# Patient Record
Sex: Female | Born: 1960
Health system: Southern US, Community
[De-identification: ages and names within clinical notes are randomized; demographics above are authoritative.]

## PROBLEM LIST (undated history)

## (undated) DIAGNOSIS — G7000 Myasthenia gravis without (acute) exacerbation: Secondary | ICD-10-CM

## (undated) DIAGNOSIS — E8729 Other acidosis: Secondary | ICD-10-CM

## (undated) DIAGNOSIS — H409 Unspecified glaucoma: Secondary | ICD-10-CM

## (undated) DIAGNOSIS — D219 Benign neoplasm of connective and other soft tissue, unspecified: Secondary | ICD-10-CM

## (undated) DIAGNOSIS — R011 Cardiac murmur, unspecified: Secondary | ICD-10-CM

## (undated) DIAGNOSIS — E46 Unspecified protein-calorie malnutrition: Secondary | ICD-10-CM

## (undated) DIAGNOSIS — N189 Chronic kidney disease, unspecified: Secondary | ICD-10-CM

## (undated) DIAGNOSIS — I82409 Acute embolism and thrombosis of unspecified deep veins of unspecified lower extremity: Secondary | ICD-10-CM

## (undated) DIAGNOSIS — E872 Acidosis: Secondary | ICD-10-CM

## (undated) DIAGNOSIS — I509 Heart failure, unspecified: Secondary | ICD-10-CM

## (undated) DIAGNOSIS — A419 Sepsis, unspecified organism: Secondary | ICD-10-CM

## (undated) DIAGNOSIS — M069 Rheumatoid arthritis, unspecified: Secondary | ICD-10-CM

## (undated) DIAGNOSIS — E876 Hypokalemia: Secondary | ICD-10-CM

## (undated) DIAGNOSIS — I10 Essential (primary) hypertension: Secondary | ICD-10-CM

## (undated) DIAGNOSIS — D509 Iron deficiency anemia, unspecified: Secondary | ICD-10-CM

## (undated) DIAGNOSIS — I878 Other specified disorders of veins: Secondary | ICD-10-CM

## (undated) DIAGNOSIS — J45909 Unspecified asthma, uncomplicated: Secondary | ICD-10-CM

## (undated) HISTORY — DX: Hypokalemia: E87.6

## (undated) HISTORY — PX: OTHER SURGICAL HISTORY: SHX169

## (undated) HISTORY — DX: Unspecified glaucoma: H40.9

## (undated) HISTORY — DX: Other acidosis: E87.29

## (undated) HISTORY — DX: Acidosis: E87.2

## (undated) HISTORY — PX: BRONCHOSCOPY: SUR163

## (undated) HISTORY — DX: Sepsis, unspecified organism: A41.9

## (undated) HISTORY — DX: Unspecified protein-calorie malnutrition: E46

## (undated) HISTORY — DX: Rheumatoid arthritis, unspecified: M06.9

## (undated) HISTORY — DX: Iron deficiency anemia, unspecified: D50.9

## (undated) HISTORY — DX: Chronic kidney disease, unspecified: N18.9

---
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
## 1998-11-26 HISTORY — PX: MYOMECTOMY: SHX85

## 2006-11-16 ENCOUNTER — Emergency Department (HOSPITAL_COMMUNITY): Admission: EM | Admit: 2006-11-16 | Discharge: 2006-11-16 | Payer: Self-pay | Admitting: Emergency Medicine

## 2007-03-31 ENCOUNTER — Emergency Department (HOSPITAL_COMMUNITY): Admission: EM | Admit: 2007-03-31 | Discharge: 2007-03-31 | Payer: Self-pay | Admitting: Family Medicine

## 2007-07-05 ENCOUNTER — Emergency Department (HOSPITAL_COMMUNITY): Admission: EM | Admit: 2007-07-05 | Discharge: 2007-07-05 | Payer: Self-pay | Admitting: Family Medicine

## 2007-10-20 ENCOUNTER — Emergency Department (HOSPITAL_COMMUNITY): Admission: EM | Admit: 2007-10-20 | Discharge: 2007-10-20 | Payer: Self-pay | Admitting: Emergency Medicine

## 2007-10-25 ENCOUNTER — Emergency Department (HOSPITAL_COMMUNITY): Admission: EM | Admit: 2007-10-25 | Discharge: 2007-10-25 | Payer: Self-pay | Admitting: Emergency Medicine

## 2007-11-22 ENCOUNTER — Emergency Department (HOSPITAL_COMMUNITY): Admission: EM | Admit: 2007-11-22 | Discharge: 2007-11-22 | Payer: Self-pay | Admitting: Family Medicine

## 2007-11-24 ENCOUNTER — Emergency Department (HOSPITAL_COMMUNITY): Admission: EM | Admit: 2007-11-24 | Discharge: 2007-11-24 | Payer: Self-pay | Admitting: Emergency Medicine

## 2008-06-13 ENCOUNTER — Emergency Department (HOSPITAL_COMMUNITY): Admission: EM | Admit: 2008-06-13 | Discharge: 2008-06-13 | Payer: Self-pay | Admitting: Emergency Medicine

## 2008-08-03 ENCOUNTER — Inpatient Hospital Stay (HOSPITAL_COMMUNITY): Admission: EM | Admit: 2008-08-03 | Discharge: 2008-08-16 | Payer: Self-pay | Admitting: Emergency Medicine

## 2008-08-04 ENCOUNTER — Encounter (INDEPENDENT_AMBULATORY_CARE_PROVIDER_SITE_OTHER): Payer: Self-pay | Admitting: *Deleted

## 2010-02-15 ENCOUNTER — Encounter: Payer: Self-pay | Admitting: *Deleted

## 2010-02-16 ENCOUNTER — Encounter: Payer: Self-pay | Admitting: Family Medicine

## 2010-02-16 ENCOUNTER — Encounter: Payer: Self-pay | Admitting: *Deleted

## 2010-05-03 LAB — CBC
HCT: 21.3 % — ABNORMAL LOW (ref 36.0–46.0)
HCT: 21.8 % — ABNORMAL LOW (ref 36.0–46.0)
HCT: 21.9 % — ABNORMAL LOW (ref 36.0–46.0)
HCT: 22.4 % — ABNORMAL LOW (ref 36.0–46.0)
HCT: 24.9 % — ABNORMAL LOW (ref 36.0–46.0)
HCT: 29.5 % — ABNORMAL LOW (ref 36.0–46.0)
Hemoglobin: 6.5 g/dL — CL (ref 12.0–15.0)
Hemoglobin: 6.9 g/dL — CL (ref 12.0–15.0)
Hemoglobin: 7.2 g/dL — CL (ref 12.0–15.0)
Hemoglobin: 7.5 g/dL — CL (ref 12.0–15.0)
Hemoglobin: 7.4 g/dL — CL (ref 12.0–15.0)
Hemoglobin: 8 g/dL — ABNORMAL LOW (ref 12.0–15.0)
Hemoglobin: 9 g/dL — ABNORMAL LOW (ref 12.0–15.0)
MCHC: 31.7 g/dL (ref 30.0–36.0)
MCHC: 31.7 g/dL (ref 30.0–36.0)
MCHC: 31 g/dL (ref 30.0–36.0)
MCHC: 31.5 g/dL (ref 30.0–36.0)
MCHC: 31.5 g/dL (ref 30.0–36.0)
MCHC: 31.6 g/dL (ref 30.0–36.0)
MCV: 64.6 fL — ABNORMAL LOW (ref 78.0–100.0)
MCV: 65.4 fL — ABNORMAL LOW (ref 78.0–100.0)
MCV: 65.6 fL — ABNORMAL LOW (ref 78.0–100.0)
MCV: 64.7 fL — ABNORMAL LOW (ref 78.0–100.0)
Platelets: 358 10*3/uL (ref 150–400)
Platelets: 360 10*3/uL (ref 150–400)
Platelets: 372 10*3/uL (ref 150–400)
Platelets: 427 10*3/uL — ABNORMAL HIGH (ref 150–400)
Platelets: 544 10*3/uL — ABNORMAL HIGH (ref 150–400)
RBC: 3.11 MIL/uL — ABNORMAL LOW (ref 3.87–5.11)
RBC: 3.32 MIL/uL — ABNORMAL LOW (ref 3.87–5.11)
RBC: 3.36 MIL/uL — ABNORMAL LOW (ref 3.87–5.11)
RBC: 3.67 MIL/uL — ABNORMAL LOW (ref 3.87–5.11)
RBC: 3.62 MIL/uL — ABNORMAL LOW (ref 3.87–5.11)
RBC: 3.87 MIL/uL (ref 3.87–5.11)
RBC: 3.93 MIL/uL (ref 3.87–5.11)
RDW: 27.5 % — ABNORMAL HIGH (ref 11.5–15.5)
RDW: 28 % — ABNORMAL HIGH (ref 11.5–15.5)
RDW: 28.2 % — ABNORMAL HIGH (ref 11.5–15.5)
RDW: 28.4 % — ABNORMAL HIGH (ref 11.5–15.5)
RDW: 28.7 % — ABNORMAL HIGH (ref 11.5–15.5)
RDW: 29.1 % — ABNORMAL HIGH (ref 11.5–15.5)
RDW: 29.2 % — ABNORMAL HIGH (ref 11.5–15.5)
RDW: 25.8 % — ABNORMAL HIGH (ref 11.5–15.5)
RDW: 26.8 % — ABNORMAL HIGH (ref 11.5–15.5)
WBC: 10.3 10*3/uL (ref 4.0–10.5)
WBC: 8.8 10*3/uL (ref 4.0–10.5)
WBC: 11.8 10*3/uL — ABNORMAL HIGH (ref 4.0–10.5)
WBC: 14.4 10*3/uL — ABNORMAL HIGH (ref 4.0–10.5)

## 2010-05-03 LAB — BASIC METABOLIC PANEL
BUN: 1 mg/dL — ABNORMAL LOW (ref 6–23)
BUN: 24 mg/dL — ABNORMAL HIGH (ref 6–23)
CO2: 25 mEq/L (ref 19–32)
CO2: 26 mEq/L (ref 19–32)
CO2: 26 mEq/L (ref 19–32)
Calcium: 7.7 mg/dL — ABNORMAL LOW (ref 8.4–10.5)
Calcium: 7.6 mg/dL — ABNORMAL LOW (ref 8.4–10.5)
Calcium: 7.7 mg/dL — ABNORMAL LOW (ref 8.4–10.5)
Calcium: 7.9 mg/dL — ABNORMAL LOW (ref 8.4–10.5)
Chloride: 112 mEq/L (ref 96–112)
Creatinine, Ser: 0.76 mg/dL (ref 0.4–1.2)
Creatinine, Ser: 1.02 mg/dL (ref 0.4–1.2)
GFR calc Af Amer: >60 mL/min (ref >60)
GFR calc Af Amer: >60 mL/min (ref >60)
GFR calc Af Amer: >60 mL/min (ref >60)
GFR calc Af Amer: >60 mL/min (ref >60)
GFR calc non Af Amer: >60 mL/min (ref >60)
GFR calc non Af Amer: 58 mL/min — ABNORMAL LOW (ref >60)
GFR calc non Af Amer: >60 mL/min (ref >60)
GFR calc non Af Amer: >60 mL/min (ref >60)
Glucose, Bld: 88 mg/dL (ref 70–99)
Glucose, Bld: 96 mg/dL (ref 70–99)
Glucose, Bld: 79 mg/dL (ref 70–99)
Potassium: 3.7 mEq/L (ref 3.5–5.1)
Potassium: 3.1 mEq/L — ABNORMAL LOW (ref 3.5–5.1)
Potassium: 3.3 mEq/L — ABNORMAL LOW (ref 3.5–5.1)
Sodium: 136 mEq/L (ref 135–145)
Sodium: 138 mEq/L (ref 135–145)
Sodium: 141 mEq/L (ref 135–145)

## 2010-05-03 LAB — COMPREHENSIVE METABOLIC PANEL
ALT: 8 U/L (ref 0–35)
ALT: 8 U/L (ref 0–35)
ALT: 10 U/L (ref 0–35)
ALT: 10 U/L (ref 0–35)
AST: 12 U/L (ref 0–37)
AST: 12 U/L (ref 0–37)
AST: 17 U/L (ref 0–37)
AST: 19 U/L (ref 0–37)
Albumin: 1.6 g/dL — ABNORMAL LOW (ref 3.5–5.2)
Albumin: 1.9 g/dL — ABNORMAL LOW (ref 3.5–5.2)
Albumin: 2.6 g/dL — ABNORMAL LOW (ref 3.5–5.2)
Alkaline Phosphatase: 58 U/L (ref 39–117)
Alkaline Phosphatase: 101 U/L (ref 39–117)
Alkaline Phosphatase: 109 U/L (ref 39–117)
BUN: 3 mg/dL — ABNORMAL LOW (ref 6–23)
BUN: 3 mg/dL — ABNORMAL LOW (ref 6–23)
BUN: 34 mg/dL — ABNORMAL HIGH (ref 6–23)
CO2: 26 mEq/L (ref 19–32)
Calcium: 7.6 mg/dL — ABNORMAL LOW (ref 8.4–10.5)
Calcium: 7.9 mg/dL — ABNORMAL LOW (ref 8.4–10.5)
Calcium: 7.5 mg/dL — ABNORMAL LOW (ref 8.4–10.5)
Calcium: 8 mg/dL — ABNORMAL LOW (ref 8.4–10.5)
Chloride: 101 mEq/L (ref 96–112)
Creatinine, Ser: 0.64 mg/dL (ref 0.4–1.2)
Creatinine, Ser: 0.68 mg/dL (ref 0.4–1.2)
Creatinine, Ser: 1.48 mg/dL — ABNORMAL HIGH (ref 0.4–1.2)
GFR calc Af Amer: >60 mL/min (ref >60)
GFR calc Af Amer: >60 mL/min (ref >60)
GFR calc Af Amer: 58 mL/min — ABNORMAL LOW (ref >60)
GFR calc Af Amer: >60 mL/min (ref >60)
GFR calc non Af Amer: >60 mL/min (ref >60)
GFR calc non Af Amer: >60 mL/min (ref >60)
GFR calc non Af Amer: 48 mL/min — ABNORMAL LOW (ref >60)
Glucose, Bld: 90 mg/dL (ref 70–99)
Glucose, Bld: 101 mg/dL — ABNORMAL HIGH (ref 70–99)
Glucose, Bld: 103 mg/dL — ABNORMAL HIGH (ref 70–99)
Glucose, Bld: 78 mg/dL (ref 70–99)
Potassium: 3.4 mEq/L — ABNORMAL LOW (ref 3.5–5.1)
Potassium: 2.8 mEq/L — ABNORMAL LOW (ref 3.5–5.1)
Potassium: 2.8 mEq/L — ABNORMAL LOW (ref 3.5–5.1)
Sodium: 139 mEq/L (ref 135–145)
Sodium: 141 mEq/L (ref 135–145)
Total Bilirubin: 0.5 mg/dL (ref 0.3–1.2)
Total Bilirubin: 0.6 mg/dL (ref 0.3–1.2)
Total Bilirubin: 0.6 mg/dL (ref 0.3–1.2)
Total Protein: 5.7 g/dL — ABNORMAL LOW (ref 6.0–8.3)
Total Protein: 5.9 g/dL — ABNORMAL LOW (ref 6.0–8.3)
Total Protein: 7.2 g/dL (ref 6.0–8.3)

## 2010-05-03 LAB — WOUND CULTURE

## 2010-05-03 LAB — DIFFERENTIAL
Basophils Absolute: 0 10*3/uL (ref 0.0–0.1)
Basophils Relative: 0 % (ref 0–1)
Basophils Relative: 0 % (ref 0–1)
Eosinophils Absolute: 0.4 10*3/uL (ref 0.0–0.7)
Eosinophils Absolute: 0 10*3/uL (ref 0.0–0.7)
Eosinophils Relative: 0 % (ref 0–5)
Eosinophils Relative: 0 % (ref 0–5)
Lymphocytes Relative: 10 % — ABNORMAL LOW (ref 12–46)
Lymphocytes Relative: 5 % — ABNORMAL LOW (ref 12–46)
Lymphs Abs: 0.8 10*3/uL (ref 0.7–4.0)
Monocytes Absolute: 1.8 10*3/uL — ABNORMAL HIGH (ref 0.1–1.0)
Monocytes Relative: 9 % (ref 3–12)
Neutrophils Relative %: 78 % — ABNORMAL HIGH (ref 43–77)
Neutrophils Relative %: 90 % — ABNORMAL HIGH (ref 43–77)

## 2010-05-03 LAB — IRON AND TIBC
Saturation Ratios: 14 % — ABNORMAL LOW (ref 20–55)
Saturation Ratios: 16 % — ABNORMAL LOW (ref 20–55)
TIBC: 134 ug/dL — ABNORMAL LOW (ref 250–470)
UIBC: 112 ug/dL

## 2010-05-03 LAB — URINALYSIS, MICROSCOPIC ONLY
Bilirubin Urine: NEGATIVE
Hgb urine dipstick: NEGATIVE
Hgb urine dipstick: NEGATIVE
Nitrite: NEGATIVE
Protein, ur: 30 mg/dL — AB
Specific Gravity, Urine: 1.013 (ref 1.005–1.030)
Urobilinogen, UA: 0.2 mg/dL (ref 0.0–1.0)
Urobilinogen, UA: 0.2 mg/dL (ref 0.0–1.0)

## 2010-05-03 LAB — CULTURE, BLOOD (ROUTINE X 2)
Culture: NO GROWTH
Culture: NO GROWTH

## 2010-05-03 LAB — URINALYSIS, ROUTINE W REFLEX MICROSCOPIC
Bilirubin Urine: NEGATIVE
Glucose, UA: NEGATIVE mg/dL
Hgb urine dipstick: NEGATIVE
Ketones, ur: NEGATIVE mg/dL
Protein, ur: NEGATIVE mg/dL

## 2010-05-03 LAB — CROSSMATCH: Antibody Screen: NEGATIVE

## 2010-05-03 LAB — LIPASE, BLOOD
Lipase: 287 U/L — ABNORMAL HIGH (ref 11–59)
Lipase: 338 U/L — ABNORMAL HIGH (ref 11–59)
Lipase: 684 U/L — ABNORMAL HIGH (ref 11–59)

## 2010-05-03 LAB — LIPID PANEL
Cholesterol: 75 mg/dL (ref 0–200)
HDL: 38 mg/dL — ABNORMAL LOW (ref >39)

## 2010-05-03 LAB — MAGNESIUM: Magnesium: 2.7 mg/dL — ABNORMAL HIGH (ref 1.5–2.5)

## 2010-05-03 LAB — BRAIN NATRIURETIC PEPTIDE: Pro B Natriuretic peptide (BNP): 135 pg/mL — ABNORMAL HIGH (ref 0.0–100.0)

## 2010-05-03 LAB — CREATININE, URINE, RANDOM: Creatinine, Urine: 42.4 mg/dL

## 2010-05-03 LAB — ABO/RH: ABO/RH(D): AB POS

## 2010-05-03 LAB — SODIUM, URINE, RANDOM: Sodium, Ur: 79 mEq/L

## 2010-05-03 LAB — FOLATE RBC: RBC Folate: 1820 ng/mL — ABNORMAL HIGH (ref 180–600)

## 2010-05-03 LAB — AMYLASE: Amylase: 548 U/L — ABNORMAL HIGH (ref 27–131)

## 2010-05-03 LAB — LACTATE DEHYDROGENASE: LDH: 195 U/L (ref 94–250)

## 2010-05-03 LAB — VITAMIN B12: Vitamin B-12: 1264 pg/mL — ABNORMAL HIGH (ref 211–911)

## 2010-05-03 LAB — FERRITIN: Ferritin: 89 ng/mL (ref 10–291)

## 2010-06-09 NOTE — Discharge Summary (Signed)
[name], [name]            ACCOUNT NO.:  [id number]   MEDICAL RECORD NO.:  [id number]          PATIENT TYPE:  INP   LOCATION:  1537                         FACILITY:  [facility]   PHYSICIAN:  Hillery Aldo, M.D.   DATE OF BIRTH:  [birth year]   DATE OF ADMISSION:  08/03/2008  DATE OF DISCHARGE:                               DISCHARGE SUMMARY   PRIMARY CARE PHYSICIAN:  Harriette Ohara at [facility]   DISCHARGE DIAGNOSES:  1. Acute pancreatitis thought to be doxycycline induced.  2. Venous stasis ulcers with polymicrobial skin infection.  3. Rheumatoid arthritis.  4. Myasthenia gravis.  5. Normocytic anemia.  6. Fibroids.  7. Menorrhagia secondary to fibroids.  8. Hypokalemia.  9. Hypertension.  10.History of deep venous thrombosis in the past.  11.Asthma.  12.Obesity.  13.Multifactorial anemia, likely due to anemia of chronic disease as      well as menorrhagia.  14.Osteoarthritis.  15.Thalassemia major.  16.Acute renal failure, resolved.   DISCHARGE MEDICATIONS:  1. Pyridostigmine 60 mg p.o. q.6 h.  2. Lasix 10 mg p.o. daily.  3. Oxycodone 10 mg p.o. q.6 h. p.r.n. pain.  4. Colace 100 mg p.o. daily p.r.n. constipation.  5. Famotidine 20 mg p.o. daily p.r.n. dyspepsia.  6. Loperamide 2 mg p.o. daily p.r.n. diarrhea.  7. Diphenhydramine 25 mg p.o. q.4 h. p.r.n. allergy symptoms.  8. Ibuprofen 400 mg p.o. q.6 h. p.r.n. pain.  9. Coreg 6.25 mg p.o. b.i.d.  10.Ferrous sulfate 325 mg p.o. t.i.d.   CONSULTATIONS:  Wound care RN.   BRIEF ADMISSION HISTORY OF PRESENT ILLNESS:  The patient is a 50-year-  old female who presented to the hospital with abdominal pain accompanied  by nausea and vomiting.  She had recently been put on doxycycline for  treatment of infected venous stasis ulcers.  Upon initial evaluation in  the emergency department, she was found to have an elevated lipase of  684, subsequently was referred to the hospitalist service for  treatment  of acute pancreatitis.  For the full details, please see the dictated  report done by Dr. Selena Batten.   PROCEDURES AND DIAGNOSTIC STUDIES:  1. CT scan of the abdomen and pelvis on August 03, 2008, showed      expansion of the pancreas with inflammatory changes.  These      findings consistent with pancreatitis.  Necrosis could not be      determined.  Underlying pancreatic neoplasm could not be excluded.      Followup to ensure resolution of these findings is warranted.      Lobulated uterus in the pelvis most consistent with multiple      fibroids.  2. Chest x-ray on August 05, 2008, status post placement of a      peripherally inserted central catheter showed the catheter tip at      the junction of the superior vena cava and right atrium.  Poor      inspiration with mild right basilar atelectasis and minimal left      basilar atelectasis.  Interval pulmonary vascular congestion      accentuated by the  poor inspiration.  3. Pelvic ultrasound on August 09, 2008, showed a lobular heterogeneous      uterus with imaging features most suggestive of multiple fibroids.   DISCHARGE LABORATORY VALUES:  Blood cultures were negative x2.  Sodium  was 133, potassium 3.6, chloride 105, bicarb 23, BUN 3, creatinine 0.64,  glucose 87.  Liver function studies were within normal limits.  Total  protein was 5.4, albumin 1.6, calcium 7.6, white blood cell count 10.3,  hemoglobin 6.9, hematocrit 21.9, platelets 341.   HOSPITAL COURSE BY PROBLEM:  1. Acute pancreatitis:  The patient's pancreatitis was felt to be      doxycycline induced.  Doxycycline was discontinued, and the patient      was put on bowel rest.  Her diet was gradually advanced, and she      initially did well with this but then had a recurrence of abdominal      pain requiring downgrading of her diet back to clear liquids.      Patient now is tolerating solid foods without recurrence of pain.      If she continues to have no reports of  pain with solid foods, she      can be discharged tomorrow, August 14, 2008.  2. Venous stasis ulcers/polymicrobial infection:  Patient does have      venous stasis ulcers mainly involving the left lower extremity.      She was seen and evaluated by the wound care nurse.  Patient has a      right posterior thigh stage II wound.  Cultures of this wound were      polymicrobial with no Staph aureus or group A Strep isolated.      Patient has completed 10 days of therapy with Zosyn, and antibiotic      coverage is going to be discontinued.  We will set her up with      ongoing home health nursing for wound care.  3. Rheumatoid arthritis:  Patient's rheumatoid arthritis is stable.  4. Myasthenia gravis:  The patient continues on treatment with      Mestinon.  5. Normocytic anemia secondary to chronic disease and menorrhagia in      the setting of fibroids:  Patient's hemoglobin has been low.      However, she refuses packed red blood cells.  She is asymptomatic      and denying any dizziness, lightheadedness, palpitations, or      dyspnea.  Her iron was low at 22 with a low total iron binding      capacity indicating both iron deficiency and anemia of chronic      disease.  Patient was encouraged to follow up with her OB/GYN as an      outpatient for definitive treatment of her fibroids.  She was also      put on iron supplementation therapy.  6. Hypokalemia:  Patient's potassium was repleted.  7. Hypertension:  Patient's blood pressure is well controlled on      Coreg.  Lasix was initially held secondary to her pancreatitis.  It      has now been resumed.  8. Acute renal failure:  The patient did have acute renal failure on      admission with an elevated creatinine at 1.48.  With hydration and      holding the Lasix, her creatinine has come down to 0.64.  At this      time, since she is complaining of swelling of  the lower      extremities, we will resume Lasix.   DISPOSITION:  The patient  is approaching medical stability and plan is  to discharge her home with home health nursing services if she continues  to tolerate a solid food diet without recurrent abdominal pain.  If her  abdominal  pain returns, she should have a repeat CT scan done to rule out  pancreatic necrosis.  Patient is instructed to follow up with her  primary care physician in 1-2 weeks.  She is instructed to follow up  with an OB/GYN for treatment of her fibroids.      Hillery Aldo, M.D.  Electronically Signed     CR/MEDQ  D:  08/13/2008  T:  08/13/2008  Job:  [id number]   cc:   Harrel Carina

## 2010-06-09 NOTE — H&P (Signed)
[name], [name]            ACCOUNT NO.:  [id number]   MEDICAL RECORD NO.:  [id number]          PATIENT TYPE:  WOC   LOCATION:  WOC                          FACILITY:  [facility]   PHYSICIAN:  [name] Maroon, MD        DATE OF BIRTH:  09-25-1960   DATE OF ADMISSION:  DATE OF DISCHARGE:                              HISTORY & PHYSICAL   [name], physician at [facility], phone number  is [phone]   CHIEF COMPLAINT:  Nausea and vomiting.   HISTORY OF PRESENT ILLNESS:  A 50 year old female with a history of  myasthenia gravis, rheumatoid arthritis, thalassemia, left calf ulcer  complains of inability to eat starting on Wednesday.  She then  apparently had nausea and vomiting yesterday which was just food and  liquid.  There was no blood or coffee grounds.  The patient then  apparently started having sharp abdominal pain that was constant without  radiation and located in the epigastric area.  She notes that she has  run out of her oxycodone and she has been taking ibuprofen.  She also  notes that she  started to take doxycycline about 1-2 weeks ago for her  leg ulcer.  The patient denies any diarrhea, constipation, bright red  blood per rectum, black stool, dysuria, hematuria, cough, chest pain or  shortness of breath.  The patient came to be evaluated for her nausea  and vomiting and was found to have a lipase elevation of 684.  She was  also noted to be slightly dehydrated with a BUN and creatinine of 34 and  1.48 and hypokalemic with a potassium of 2.3.  Her albumin was also low  with 2.6 signifying poor nutritional state and she was noted to be  anemic with a hemoglobin of 9.2, with a low MCV of 64 ,and a RDW of  25.8.  The patient will be admitted for workup of pancreatitis.   PAST MEDICAL HISTORY:  1. Hypertension.  2. Asthma.  3. History of DVT in bilateral lower extremities.  4. Myasthenia gravis.  5. ? Muscular dystrophy  6. Obesity.  7.  Anemia.  8. Cardiac murmur.  9. Osteoarthritis.  10.Rheumatoid arthritis.  11.Right knee cap displacement  12.Osteoporosis.  13.Thalassemia major  14.Tinea pedis  15.Venous stasis changes.  16.Post phlebitic syndrome.  17.Left calf ulcer.   PAST SURGICAL HISTORY:  1. Tonsillectomy  2. Myomectomy.  3. Bronchoscopy with biopsy in the past, results unknown  4. Apligraf to left leg that did not take.   SOCIAL HISTORY:  The patient does not smoke or drink.  She was formerly  a Warden/ranger.  She is presently on disability.   FAMILY HISTORY:  Mother died of at age 47 and died from atrial  fibrillation per the patient after being diagnosed with breast cancer  stage IV and having some chemo which caused some form of cardiomyopathy.  Her father died at age 59 from a stroke.  She also notes that she has a  family history of hypertension and heart disease.   REVIEW OF SYSTEMS:  She states that she  may have been diagnosed recently  with multiple myeloma.  She is not sure.  Otherwise, no fever, no  chills, negative for all 10 organ systems except for pertinent positives  as stated above.   ALLERGIES:  TYLENOL, ASPIRIN, BACTRIM, DAKIN'S, DILAUDID, ERYTHROMYCIN  BASE, INDOMETHACIN, METHOTREXATE, NEOSPORIN, PERCOCET (due to the  Tylenol component), PREDNISONE, SILVER, TRAMADOL, VICODIN (due to the  Tylenol component).   MEDICATIONS:  Per list from ED.  1. Pyridostigmine 60 mg p.o. q.6 h.  2. Lasix 10 mg p.o. daily.  3. Oxycodone 10 mg p.o. q.6 h.  4. Doxycycline 100 mg p.o. b.i.d.  5. Stool softener p.r.n.  6. Famotidine 20 mg p.o. daily p.r.n.  7. loperamide 2 mg p.o. daily p.r.n.  8. Diphenhydramine 25 mg p.r.n.  9. Ibuprofen p.r.n. (has been recently taking).   PHYSICAL EXAM:  VITAL SIGNS:  Temperature 97.8, pulse 78, respiratory  rate 22, blood pressure 136/67.  HEENT: Anicteric, EOMI, no nystagmus, pupils 1.5 mm, symmetric, direct,  consensual, near reflexes intact.  Mucous  membranes moist.  NECK:  No JVD, no bruit, no thyromegaly, no adenopathy.  HEART:  Regular rate and rhythm.  S1-S2.  LUNGS:  Clear to auscultation bilaterally.  ABDOMEN: Soft, slightly tender, no guarding, no rigidity, no rebound,  positive normoactive bowel sounds.  EXTREMITIES:  No cyanosis, clubbing or edema, DP pulses 2+ bilaterally.  SKIN:  Evidence for a left distal lower extremity ulcer about 10 x 8 cm,  shallow, 0.5 mm deep, circumferential around the left distal lower  extremity, smaller ulcer on right distal lower extremity which was 1 cm  round, mobile, shallow, no purulent drainage, no erythema, no warmth,  apparently there is an ulcer on her thigh, but she would not let me look  at this ulcer.  NEURO EXAM:  Cranial nerves II-XII intact, reflexes 2+, symmetric,  diffuse with downgoing toes bilaterally, motor strength 5/5 all four  extremities, pinprick intact.   LABS:  WBC 13.1, hemoglobin 9.2, MCV 64, RDW 25.8, platelet count 544.  Sodium 138, potassium 2.3 (low), chloride 95, bicarb 28, BUN 34,  creatinine 1.48 (high), AST 19, ALT 10, albumin 2.6 (low), total  bilirubin 0.6, urinalysis negative, urine pregnancy test negative,  lipase 684.   ASSESSMENT/PLAN:  #1.  Nausea, vomiting, pancreatitis:  We will check an  LDH and a lipid panel to complete Rampton's criteria.  We will  discontinue doxycycline.  We will start her on Protonix 40 mg IV daily.  We will discontinue oxycodone in favor morphine sulfate 2 mg IV q.6 h.  standing as well as 2 mg IV q.2 h. p.r.n. additional pain.  The patient  was made n.p.o.  We will hydrate with normal saline IV.  We will obtain  a CT scan of the abdomen and pelvis which is pending.  #2.  Acute renal failure possibly secondary to dehydration or ibuprofen:  We will check urinalysis, check urine sodium, urine creatinine, urine  eosinophils.  #3.  Hypokalemia:  We will place 28 mEq of potassium chloride in 1 liter  of normal saline and  hydrate with that as well as giving 10 mEq of  potassium chloride and 100 of normal saline IV x 4 rounds.  We will  repeat a potassium at 7 p.m.  Her hypokalemia is probably secondary to  her nausea and vomiting.  #4.  Anemia:  We will check iron studies, B12, folic acid and she could  have further workup as an outpatient for an SPEP and  UPEP and possibly  colonoscopy if she has not had this in the past.  #5.  Lower extremity ulcers:  We will Consult Wound Care.  We will  obtain a wound culture and the patient will be placed on an air  mattress.  We will treat with IV Zosyn for now.  #6.  Rheumatoid arthritis:  The patient can follow-up with Dr. Corliss Skains  as an outpatient.  She states she has an appointment with her.  #7.  Deep venous thrombosis prophylaxis:  Lovenox 30 mg subcu daily.  She says she has renal insufficiency.      [name] Maroon, MD  Electronically Signed     JYK/MEDQ  D:  08/03/2008  T:  08/03/2008  Job:  [id number]   cc:   Maryelizabeth Rowan, M.D.  Fax: [phone]   Vanessa Kick, MD [facility]   [facility]  Wound Care Center at [facility]

## 2010-06-12 NOTE — Discharge Summary (Signed)
[name], [name]            ACCOUNT NO.:  [id number]   MEDICAL RECORD NO.:  [id number]          PATIENT TYPE:  WOC   LOCATION:  WOC                          FACILITY:  [facility]   PHYSICIAN:  Hillery Aldo, M.D.   DATE OF BIRTH:  Nov 11, 1960   DATE OF ADMISSION:  DATE OF DISCHARGE:                               DISCHARGE SUMMARY   NO DICTATION.      Hillery Aldo, M.D.  Electronically Signed     CR/MEDQ  D:  09/04/2008  T:  09/04/2008  Job:  [id number]

## 2010-06-12 NOTE — Consult Note (Signed)
NAMEARELYS, [name]            ACCOUNT NO.:  [id number]   MEDICAL RECORD NO.:  [id number]          PATIENT TYPE:  REC   LOCATION:  FOOT                         FACILITY:  [facility]   PHYSICIAN:  Jonelle Sports. Sevier, M.D. DATE OF BIRTH:  12/05/1960   DATE OF CONSULTATION:  01/31/2008  DATE OF DISCHARGE:                                 CONSULTATION   This 50 year old white female, referred by nurse practitioner, Merita Norton,  for care of a left leg wound, arrived today for such consultation.   The patient brought with her an extensive number of notes, which will be  chronicled in her chart that she had herself generated and a number of  things that she told our nurse in response to questioning here and that  indicate an extreme degree of emotional unhealth on the patient's part  and that reflect total dissatisfaction with care that she has previously  received all up and down the [place] to include [employer],  [facility] in [place], and [employer].   In reviewing all of this, it is my assessment that we will bring no  satisfaction to this woman either to her or to ourselves in attempting  to treat her that we have nothing to offer her that she has not already  been offered and of which she is extremely critical.  It is my advice  that we enter into no contractual patient care relationship with her.  This has been discussed with the clinic administrator, who is in  agreement, and accordingly it is presented to the patient that we have  no desire to assume her care, and she is accepting that recommendation.  We today returned her to addressing that was as near as possible all  those materials with which she was addressed when she came in for her  left lower extremity wound, and I have recommended that she seek care at  a major medical center because of the complexity of her problems and her  dissatisfaction with a multiple previous cares.   The patient did not  appear angry, was accepting this recommendation, and  thanked [name] for our honesty.   Therefore, this patient is not now and should not in the future be  accepted as a patient on anything less than a life and death emergency  in this hospital system.           ______________________________  Jonelle Sports Cheryll Cockayne, M.D.     RES/MEDQ  D:  01/31/2008  T:  02/01/2008  Job:  [id number]   cc:   Carren Rang, M.D.  Tamera Punt, [place]

## 2010-06-12 NOTE — Discharge Summary (Signed)
NAMESAMMY, DOUTHITT            ACCOUNT NO.:  [id number]   MEDICAL RECORD NO.:  [id number]          PATIENT TYPE:  INP   LOCATION:  1537                         FACILITY:  [facility]   PHYSICIAN:  Hillery Aldo, M.D.   DATE OF BIRTH:  Oct 17, 1960   DATE OF ADMISSION:  08/03/2008  DATE OF DISCHARGE:  08/16/2008                               DISCHARGE SUMMARY   ADDENDUM:   PRIMARY CARE PHYSICIAN:  Dr. Harriette Ohara at [facility].   OB/GYN:  Dr. Harriette Ohara.   LOCAL PCP:  Dr. Maryelizabeth Rowan.   For complete list of the discharge diagnoses, discharge medications,  consultations, brief admission HPI, and hospital course by problem  through August 13, 2008, please see the previously dictated discharge  summary done by myself.   ADDENDUM:  The patient remained medically stable and essentially  awaiting medical stability and the ability to tolerate a solid food diet  without recurrent abdominal pain prior to discharge.  Patient continued  to show gradual improvement and ultimately was stable for discharge on  August 16, 2008.  Her pancreatitis was resolved at discharge time.  She  was encouraged to follow up with her primary care physician, Dr.  Maryelizabeth Rowan, Dr. Harriette Ohara at [facility], and also with Dr.  Harriette Ohara for followup of her fibroids.   CONDITION AT DISCHARGE:  Improved.   TIME SPENT COORDINATING CARE FOR DISCHARGE AND DISCHARGE INSTRUCTIONS:  Equals 30 minutes.      Hillery Aldo, M.D.  Electronically Signed     CR/MEDQ  D:  09/04/2008  T:  09/04/2008  Job:  [id number]   cc:   Harriette Ohara, M.D.  [facility]   Maryelizabeth Rowan, M.D.  Fax: [phone]

## 2010-07-10 ENCOUNTER — Inpatient Hospital Stay (INDEPENDENT_AMBULATORY_CARE_PROVIDER_SITE_OTHER)
Admission: RE | Admit: 2010-07-10 | Discharge: 2010-07-10 | Disposition: A | Discharge status: Home / Self Care | Payer: Medicare Other | Source: Ambulatory Visit | Attending: Family Medicine | Admitting: Family Medicine

## 2010-07-10 DIAGNOSIS — Z760 Encounter for issue of repeat prescription: Secondary | ICD-10-CM

## 2010-07-10 DIAGNOSIS — I10 Essential (primary) hypertension: Secondary | ICD-10-CM

## 2010-07-10 DIAGNOSIS — G7000 Myasthenia gravis without (acute) exacerbation: Secondary | ICD-10-CM

## 2010-07-10 DIAGNOSIS — R05 Cough: Secondary | ICD-10-CM

## 2010-07-25 ENCOUNTER — Emergency Department (HOSPITAL_COMMUNITY): Payer: Medicare Other

## 2010-07-25 ENCOUNTER — Emergency Department (HOSPITAL_COMMUNITY)
Admission: EM | Admit: 2010-07-25 | Discharge: 2010-07-25 | Disposition: A | Discharge status: Home / Self Care | Payer: Medicare Other | Attending: Emergency Medicine | Admitting: Emergency Medicine

## 2010-07-25 DIAGNOSIS — R609 Edema, unspecified: Secondary | ICD-10-CM | POA: Insufficient documentation

## 2010-07-25 DIAGNOSIS — R0602 Shortness of breath: Secondary | ICD-10-CM | POA: Insufficient documentation

## 2010-07-25 DIAGNOSIS — I872 Venous insufficiency (chronic) (peripheral): Secondary | ICD-10-CM | POA: Insufficient documentation

## 2010-07-25 DIAGNOSIS — R0989 Other specified symptoms and signs involving the circulatory and respiratory systems: Secondary | ICD-10-CM | POA: Insufficient documentation

## 2010-07-25 DIAGNOSIS — D561 Beta thalassemia: Secondary | ICD-10-CM | POA: Insufficient documentation

## 2010-07-25 DIAGNOSIS — R0609 Other forms of dyspnea: Secondary | ICD-10-CM | POA: Insufficient documentation

## 2010-07-25 DIAGNOSIS — G7000 Myasthenia gravis without (acute) exacerbation: Secondary | ICD-10-CM | POA: Insufficient documentation

## 2010-07-25 DIAGNOSIS — I10 Essential (primary) hypertension: Secondary | ICD-10-CM | POA: Insufficient documentation

## 2010-08-11 ENCOUNTER — Inpatient Hospital Stay (INDEPENDENT_AMBULATORY_CARE_PROVIDER_SITE_OTHER)
Admission: RE | Admit: 2010-08-11 | Discharge: 2010-08-11 | Disposition: A | Discharge status: Home / Self Care | Payer: Medicare Other | Source: Ambulatory Visit | Attending: Family Medicine | Admitting: Family Medicine

## 2010-08-11 DIAGNOSIS — I10 Essential (primary) hypertension: Secondary | ICD-10-CM

## 2010-08-11 DIAGNOSIS — Z760 Encounter for issue of repeat prescription: Secondary | ICD-10-CM

## 2010-08-11 DIAGNOSIS — G7000 Myasthenia gravis without (acute) exacerbation: Secondary | ICD-10-CM

## 2010-08-21 ENCOUNTER — Encounter (HOSPITAL_BASED_OUTPATIENT_CLINIC_OR_DEPARTMENT_OTHER): Payer: Medicare Other | Attending: General Surgery

## 2010-08-21 DIAGNOSIS — E669 Obesity, unspecified: Secondary | ICD-10-CM | POA: Insufficient documentation

## 2010-08-21 DIAGNOSIS — L97209 Non-pressure chronic ulcer of unspecified calf with unspecified severity: Secondary | ICD-10-CM | POA: Insufficient documentation

## 2010-08-21 DIAGNOSIS — L97309 Non-pressure chronic ulcer of unspecified ankle with unspecified severity: Secondary | ICD-10-CM | POA: Insufficient documentation

## 2010-08-21 DIAGNOSIS — G7000 Myasthenia gravis without (acute) exacerbation: Secondary | ICD-10-CM | POA: Insufficient documentation

## 2010-08-21 DIAGNOSIS — M069 Rheumatoid arthritis, unspecified: Secondary | ICD-10-CM | POA: Insufficient documentation

## 2010-08-21 DIAGNOSIS — I872 Venous insufficiency (chronic) (peripheral): Secondary | ICD-10-CM | POA: Insufficient documentation

## 2010-08-21 DIAGNOSIS — G7109 Other specified muscular dystrophies: Secondary | ICD-10-CM | POA: Insufficient documentation

## 2010-08-21 DIAGNOSIS — M1990 Unspecified osteoarthritis, unspecified site: Secondary | ICD-10-CM | POA: Insufficient documentation

## 2010-08-21 DIAGNOSIS — Z86718 Personal history of other venous thrombosis and embolism: Secondary | ICD-10-CM | POA: Insufficient documentation

## 2010-08-21 DIAGNOSIS — I10 Essential (primary) hypertension: Secondary | ICD-10-CM | POA: Insufficient documentation

## 2010-08-21 DIAGNOSIS — D561 Beta thalassemia: Secondary | ICD-10-CM | POA: Insufficient documentation

## 2010-08-25 ENCOUNTER — Inpatient Hospital Stay (HOSPITAL_COMMUNITY)
Admission: EM | Admit: 2010-08-25 | Discharge: 2010-08-31 | DRG: 300 | Disposition: A | Discharge status: Home Health | Payer: Medicare Other | Attending: Internal Medicine | Admitting: Internal Medicine

## 2010-08-25 DIAGNOSIS — R0789 Other chest pain: Secondary | ICD-10-CM | POA: Diagnosis present

## 2010-08-25 DIAGNOSIS — R52 Pain, unspecified: Secondary | ICD-10-CM | POA: Diagnosis present

## 2010-08-25 DIAGNOSIS — D561 Beta thalassemia: Secondary | ICD-10-CM | POA: Diagnosis present

## 2010-08-25 DIAGNOSIS — G7000 Myasthenia gravis without (acute) exacerbation: Secondary | ICD-10-CM | POA: Diagnosis present

## 2010-08-25 DIAGNOSIS — I872 Venous insufficiency (chronic) (peripheral): Principal | ICD-10-CM | POA: Diagnosis present

## 2010-08-25 DIAGNOSIS — E669 Obesity, unspecified: Secondary | ICD-10-CM | POA: Diagnosis present

## 2010-08-25 DIAGNOSIS — H409 Unspecified glaucoma: Secondary | ICD-10-CM | POA: Diagnosis present

## 2010-08-25 DIAGNOSIS — D638 Anemia in other chronic diseases classified elsewhere: Secondary | ICD-10-CM | POA: Diagnosis present

## 2010-08-25 DIAGNOSIS — M810 Age-related osteoporosis without current pathological fracture: Secondary | ICD-10-CM | POA: Diagnosis present

## 2010-08-25 DIAGNOSIS — I10 Essential (primary) hypertension: Secondary | ICD-10-CM | POA: Diagnosis present

## 2010-08-25 DIAGNOSIS — G8929 Other chronic pain: Secondary | ICD-10-CM | POA: Diagnosis present

## 2010-08-25 DIAGNOSIS — R002 Palpitations: Secondary | ICD-10-CM | POA: Diagnosis present

## 2010-08-25 DIAGNOSIS — E860 Dehydration: Secondary | ICD-10-CM | POA: Diagnosis present

## 2010-08-25 DIAGNOSIS — B965 Pseudomonas (aeruginosa) (mallei) (pseudomallei) as the cause of diseases classified elsewhere: Secondary | ICD-10-CM | POA: Diagnosis present

## 2010-08-25 DIAGNOSIS — Z888 Allergy status to other drugs, medicaments and biological substances status: Secondary | ICD-10-CM

## 2010-08-25 DIAGNOSIS — M1990 Unspecified osteoarthritis, unspecified site: Secondary | ICD-10-CM | POA: Diagnosis present

## 2010-08-25 DIAGNOSIS — L97809 Non-pressure chronic ulcer of other part of unspecified lower leg with unspecified severity: Secondary | ICD-10-CM | POA: Diagnosis present

## 2010-08-25 DIAGNOSIS — Z86718 Personal history of other venous thrombosis and embolism: Secondary | ICD-10-CM

## 2010-08-25 DIAGNOSIS — M069 Rheumatoid arthritis, unspecified: Secondary | ICD-10-CM | POA: Diagnosis present

## 2010-08-25 DIAGNOSIS — Z79899 Other long term (current) drug therapy: Secondary | ICD-10-CM

## 2010-08-25 DIAGNOSIS — J45909 Unspecified asthma, uncomplicated: Secondary | ICD-10-CM | POA: Diagnosis present

## 2010-08-26 ENCOUNTER — Emergency Department (HOSPITAL_COMMUNITY): Payer: Medicare Other

## 2010-08-26 LAB — DIFFERENTIAL
Basophils Absolute: 0 10*3/uL (ref 0.0–0.1)
Basophils Relative: 0 % (ref 0–1)
Eosinophils Absolute: 0 10*3/uL (ref 0.0–0.7)
Monocytes Absolute: 1.5 10*3/uL — ABNORMAL HIGH (ref 0.1–1.0)
Neutro Abs: 10.6 10*3/uL — ABNORMAL HIGH (ref 1.7–7.7)
Neutrophils Relative %: 82 % — ABNORMAL HIGH (ref 43–77)

## 2010-08-26 LAB — PHOSPHORUS: Phosphorus: 3.1 mg/dL (ref 2.3–4.6)

## 2010-08-26 LAB — D-DIMER, QUANTITATIVE: D-Dimer, Quant: 1.62 ug/mL-FEU — ABNORMAL HIGH (ref 0.00–0.48)

## 2010-08-26 LAB — CBC
HCT: 39.8 % (ref 36.0–46.0)
MCH: 24.1 pg — ABNORMAL LOW (ref 26.0–34.0)
MCV: 74.8 fL — ABNORMAL LOW (ref 78.0–100.0)

## 2010-08-26 LAB — BASIC METABOLIC PANEL
BUN: 35 mg/dL — ABNORMAL HIGH (ref 6–23)
CO2: 23 mEq/L (ref 19–32)
Glucose, Bld: 120 mg/dL — ABNORMAL HIGH (ref 70–99)
Potassium: 3.2 mEq/L — ABNORMAL LOW (ref 3.5–5.1)
Sodium: 132 mEq/L — ABNORMAL LOW (ref 135–145)

## 2010-08-26 LAB — PROCALCITONIN: Procalcitonin: 0.22 ng/mL

## 2010-08-27 DIAGNOSIS — L97809 Non-pressure chronic ulcer of other part of unspecified lower leg with unspecified severity: Secondary | ICD-10-CM

## 2010-08-27 LAB — COMPREHENSIVE METABOLIC PANEL
Alkaline Phosphatase: 67 U/L (ref 39–117)
BUN: 29 mg/dL — ABNORMAL HIGH (ref 6–23)
Calcium: 8.8 mg/dL (ref 8.4–10.5)
Creatinine, Ser: 1.2 mg/dL — ABNORMAL HIGH (ref 0.50–1.10)
GFR calc Af Amer: 57 mL/min — ABNORMAL LOW (ref >60)
Glucose, Bld: 125 mg/dL — ABNORMAL HIGH (ref 70–99)
Total Protein: 6.8 g/dL (ref 6.0–8.3)

## 2010-08-27 LAB — CBC
HCT: 31.9 % — ABNORMAL LOW (ref 36.0–46.0)
RDW: 16.9 % — ABNORMAL HIGH (ref 11.5–15.5)
WBC: 5 10*3/uL (ref 4.0–10.5)

## 2010-08-27 LAB — DIFFERENTIAL
Basophils Absolute: 0 10*3/uL (ref 0.0–0.1)
Eosinophils Relative: 6 % — ABNORMAL HIGH (ref 0–5)
Lymphocytes Relative: 25 % (ref 12–46)
Neutro Abs: 2.6 10*3/uL (ref 1.7–7.7)
Neutrophils Relative %: 51 % (ref 43–77)

## 2010-08-27 LAB — MAGNESIUM: Magnesium: 2.4 mg/dL (ref 1.5–2.5)

## 2010-08-27 LAB — PTH, INTACT AND CALCIUM: PTH: 22.6 pg/mL (ref 14.0–72.0)

## 2010-08-28 LAB — PREALBUMIN: Prealbumin: 14.2 mg/dL — ABNORMAL LOW (ref 17.0–34.0)

## 2010-08-29 LAB — BASIC METABOLIC PANEL
BUN: 19 mg/dL (ref 6–23)
GFR calc Af Amer: >60 mL/min (ref >60)
GFR calc non Af Amer: >60 mL/min (ref >60)
Potassium: 3.7 mEq/L (ref 3.5–5.1)
Sodium: 137 mEq/L (ref 135–145)

## 2010-08-30 LAB — T3, FREE: T3, Free: 2.6 pg/mL (ref 2.3–4.2)

## 2010-08-30 LAB — TSH: TSH: 1.723 u[IU]/mL (ref 0.350–4.500)

## 2010-09-01 LAB — CULTURE, BLOOD (ROUTINE X 2): Culture  Setup Time: 201208010854

## 2010-09-01 NOTE — Consult Note (Signed)
[name], PARE            ACCOUNT NO.:  [id number]  MEDICAL RECORD NO.:  [id number]  LOCATION:  1508                         FACILITY:  [facility]  PHYSICIAN:  Wayland Denis, DO      DATE OF BIRTH:  21-Feb-1960  DATE OF CONSULTATION:  08/26/2010 DATE OF DISCHARGE:                                CONSULTATION   CHIEF COMPLAINT:  Bilateral lower extremity ulcers.  HISTORY OF PRESENT ILLNESS:  [name] is a 50 year old black female who was admitted for infection.  She has been seen at the wound care center for bilateral lower extremity wounds, likely venous stasis ulcers.  She has had these for years and she has failed all treatment endeavors.  I do not have her full records from her previous attendings. Based on the patient's history, she has had multiple attempts at dressing changes,  types including Unna boots, wraps, and Apligraft. She states that she has not been skin grafted.  She has tried Oasis. The one that she seem to favor the most was the [employer].  She has been at multiple different institutions without a strong understanding given for why multiple different places.  She has multiple medical conditions including myasthenia, high blood pressure, and venous stasis.  She states that peripheral vascular studies were done in the past, but it does not look like they were done recently.  She states that the wounds are not getting any better with any of the current treatments and she is seeking different course of care.  She has been in the hospital for 1 day.  Her original complaint was increased pain and discharge from her wounds.  She also complained of chest pain and palpitations and was brought to the hospital ER via EMS.  PAST MEDICAL HISTORY: 1. Hypertension. 2. Asthma. 3. History of pancreatitis. 4. History of DVTs. 5. Myasthenia gravis. 6. Obesity. 7. Anemia. 8. Cardiac disease. 9. Osteoarthritis. 10.Rheumatoid arthritis. 11.Osteoporosis. 12.Thalassemia  major. 13.Tinea pedis. 14.Venous stasis ulcers.  SURGICAL HISTORY:  Myomectomy, bronchoscopy, tonsillectomy, right knee surgery replacement and Apligraf.  MEDICATIONS:  From home include triamterene, oxycodone, ibuprofen, multivitamin, diphenhydramine, Mestinon, ferrous sulfate, vitamin E, B12, folic acid, loperamide, famotidine, Colace, calcium carbonate, and albuterol.  MEDICATIONS:  In the hospital include Zosyn, vancomycin, as well as oral medications.  ALLERGIES: 1. TYLENOL. 2. ALGINATE. 3. ASPIRIN. 4. BACTRIM. 5. DAKIN'S SOLUTION. 6. DILAUDID. 7. ERYTHROMYCIN. 8. INDOMETHACIN. 9. METHOTREXATE. 10.PREDNISONE. 11.NEOSPORIN. 12.PERCOCET. 13.SILVER. 14.TRAMADOL. 15.ERYTHROMYCIN.  SOCIAL HISTORY:  The patient is currently disabled.  She denies any tobacco, alcohol, or illegal drug use.  She has a history of being a psychologist.  FAMILY HISTORY:  At this point is noncontributory.  Her father died in his 42s of a stroke and her mother in her 37s with breast cancer.  REVIEW OF SYSTEMS:  As stated above.  There has been no significant shortness of breath.  She did complain of chest pain the night of admission.  At this point, she is not complaining of any.  There has been no blood in her urine or stool.  All other systems negative.  PHYSICAL EXAMINATION:  VITAL SIGNS:  Her temperature is 97.9, her pulse is 98, respirations 19, her blood  pressure is 110/75, and she is 95 plus on room air. GENERAL:  She is alert, oriented, cooperative.  She is not in any acute distress. HEENT:  Her pupils are equal.  Extraocular muscles are intact.  Her breathing is unlabored. CARDIAC:  Her heart rate is regular. ABDOMEN:  Soft.  She appears to be a good historian. EXTREMITIES:  Her lower extremities, she has ulcerations bilateral, somewhere circumferential, including both the anterior and posterior lower leg.  There is granulating tissue.  There does not appear to be any over sign  of infection or drainage.  There is very little fibrous tissue.  There is some swelling in the lower extremities, but not in excess.  Pulses were present and regular.  She does have capillary refill.  She has signs of chronic disease and chronic swelling.  She does not have any signs of cyanosis.  There is some mild clubbing of the toenails.  The size of the wound is for the majority of the lower extremity.  Her lab work shows white count of 13, platelets of 401, increased neutrophils and monocytes.  Lactic acid 1.2.  Procalcitonin normal at 0.22.  TSH and T3 within normal range.  ASSESSMENT:  Bilateral lower extremity venous stasis ulcers.  PLAN:  I recommend checking a prealbumin to evaluate her nutritional status to be sure that she needs nutrition to actually heal the wounds or any surgical intervention.  Additionally, she should have bilateral lower extremity Dopplers, ABIs, and TBIs to further evaluate her blood flow.  Her situation is very concerning for chronic ulcers as well as compliance issues.  She stated in her wound care note that she does not have money for food, this is concerning overall.  At the moment, I would recommend Santyl dressings once a day and cleaning in between with Dial soap, keeping them elevated is very important.  Once I see the results of the studies, I will be able to better determine whether or not surgical intervention can offer anything.  It may be beneficial to try Integra with VAC placement; if that works, she would then be able to go on to a skin graft, but I will wait to get the lab results and studies first.  Please feel free to call with any questions.     Wayland Denis, DO     CS/MEDQ  D:  08/26/2010  T:  08/27/2010  Job:  [id number]  Electronically Signed by Wayland Denis  on 09/01/2010 08:02:20 AM

## 2010-09-09 ENCOUNTER — Encounter (HOSPITAL_BASED_OUTPATIENT_CLINIC_OR_DEPARTMENT_OTHER): Payer: Medicare Other | Attending: General Surgery

## 2010-09-09 DIAGNOSIS — L97209 Non-pressure chronic ulcer of unspecified calf with unspecified severity: Secondary | ICD-10-CM | POA: Insufficient documentation

## 2010-09-09 DIAGNOSIS — G7000 Myasthenia gravis without (acute) exacerbation: Secondary | ICD-10-CM | POA: Insufficient documentation

## 2010-09-09 DIAGNOSIS — L97309 Non-pressure chronic ulcer of unspecified ankle with unspecified severity: Secondary | ICD-10-CM | POA: Insufficient documentation

## 2010-09-09 DIAGNOSIS — G7109 Other specified muscular dystrophies: Secondary | ICD-10-CM | POA: Insufficient documentation

## 2010-09-09 DIAGNOSIS — Z86718 Personal history of other venous thrombosis and embolism: Secondary | ICD-10-CM | POA: Insufficient documentation

## 2010-09-09 DIAGNOSIS — I872 Venous insufficiency (chronic) (peripheral): Secondary | ICD-10-CM | POA: Insufficient documentation

## 2010-09-09 DIAGNOSIS — M069 Rheumatoid arthritis, unspecified: Secondary | ICD-10-CM | POA: Insufficient documentation

## 2010-09-09 DIAGNOSIS — M1990 Unspecified osteoarthritis, unspecified site: Secondary | ICD-10-CM | POA: Insufficient documentation

## 2010-09-09 DIAGNOSIS — I10 Essential (primary) hypertension: Secondary | ICD-10-CM | POA: Insufficient documentation

## 2010-09-09 DIAGNOSIS — D561 Beta thalassemia: Secondary | ICD-10-CM | POA: Insufficient documentation

## 2010-09-09 DIAGNOSIS — E669 Obesity, unspecified: Secondary | ICD-10-CM | POA: Insufficient documentation

## 2010-09-09 NOTE — Discharge Summary (Signed)
[name], [name]            ACCOUNT NO.:  [id number]  MEDICAL RECORD NO.:  [id number]  LOCATION:  1508                         FACILITY:  [facility]  PHYSICIAN:  Jeoffrey Massed, MD    DATE OF BIRTH:  10-14-1960  DATE OF ADMISSION:  08/25/2010 DATE OF DISCHARGE:  08/31/2010                        DISCHARGE SUMMARY - REFERRING   PRIMARY CARE PRACTITIONER:  She does not have one.  She will call the health clinic number and make an appointment with a doctor of her choosing.  PRIMARY DISCHARGE DIAGNOSES:  Extensive bilateral lower extremity venous stasis ulcers with acute on chronic pain and discharge.  SECONDARY DISCHARGE DIAGNOSES/PAST MEDICAL HISTORY:  Significant for: 1. Chronic bilateral lower extremity venous stasis ulcers. 2. Hypertension. 3. Asthma. 4. Myasthenia gravis. 5. Obesity. 6. Anemia likely secondary to chronic disease. 7. History of osteoarthritis. 8. Osteoporosis. 9. Questionable history of prior thalassemia major. 10.Rheumatoid arthritis.  DISCHARGE MEDICATIONS: 1. Ciprofloxacin 500 mg 1 tablet twice daily. 2. Santyl 1 application daily. 3. Ensure 237 mL p.o. three times a day. 4. MiraLAX 17 g p.o. twice daily. 5. Senokot 2 tablets p.o. daily at bedtime. 6. Albuterol inhaler 1 to 2 puffs inhaled q.4 h p.r.n. 7. Benadryl 2 tablets p.o. daily p.r.n. 8. EpiPen 1 injection subcutaneously daily. 9. Ferrous sulfate 325 mg 3 tablets p.o. daily. 10.Fish oil 2 capsules p.o. daily. 11.Folic acid 0.4 mg 1 tablet daily. 12.Garlic extract 2 tablets p.o. daily. 13.Gelatin 4 tablets p.o. daily. 14.Hydrocortisone ointment 1 application topically daily to wounds. 15.Lidocaine 1 application topically daily. 16.Mestinon 60 mg 1 tablet four times a day. 17.Multivitamins 1 tablet daily. 18.Oxycodone 10 mg 1 tablet p.o. four times a day p.r.n. 19.Maxzide 75/50 1 tablet p.o. daily. 20.Vitamin A and vitamin D ointment 1 application topically daily to      wounds. 21.Vitamin B12 1000 mcg 1 tablet p.o. daily. 22.Vitamin E 1 tablet p.o. daily. 23.Zinc oxide 1 application p.o. daily to wounds.  CONSULTANTS:  Dr. Wayland Denis from plastic surgery.  HISTORY OF PRESENT ILLNESS:  The patient is a very pleasant 50 year old female with the above-noted medical problems who came into the hospital on July 31 complaining of pain in the lower extremities.  For further details, please see the history and physical that was dictated by Dr. Toniann Fail on admission.  PERTINENT RADIOLOGICAL STUDIES: 1. X-ray of the left ankle showed no acute bony abnormality. 2. X-ray of the left fibula showed no acute bony abnormality. 3. X-ray of the right ankle showed no acute on abnormality. 4. X-ray of the right tibia-fibula showed no acute bony abnormality. 5. X-ray of right femur showed no acute bony abnormality. 6. X-ray of the left femur showed no acute abnormality.  PERTINENT LABORATORY STUDIES: 1. TSH is 1.723. 2. Prealbumin was 14.2. 3. Blood cultures done on August 26, 2010, were negative. 4. An outpatient wound culture done on the 27th of July grew     Pseudomonas aeruginosa which was pansensitive.  BRIEF HOSPITAL COURSE: 1. Bilateral lower extremity pain with increased discharge.  The     patient has a history of bilateral chronic lower extremity ulcers     secondary to venous stasis.  She was admitted primarily for pain  control and wound care.  Plastic surgery consultation was obtained     who initially thought that the patient would possibly benefit from     possible skin grafting, but they wanted further evaluation in terms     of her nutritional status and her vascular studies.  That was done;     her ABI was within normal limits.  Her prealbumin was slightly     reduced at 14.2.  However, during the hospital course, the patient     changed her mind and did not want to undergo surgical intervention     right away.  She will follow up at the  Wound Care Clinic with Dr.     Kelly Splinter; an appointment has been made by me personally for her on     the 15th of this month at 1:00 p.m.  In the meantime, she is to a     continue wound care.  Dr. Allyne Gee recommendations are to use Santyl     to bilateral lower extremity ulcers along with Kerlix daily      to wrap them.  Our Case Manager/Worker will arrange     for home health wound care as well.  The patient is to continue     following at the Wound Care Clinic on a p.r.n. basis. 2. Myasthenia gravis:  This is stable.  She is to continue Mestinon. 3. Hypertension:  This is controlled with Maxzide.  Rest of her medical issues were stable.  DISPOSITION:  The patient is considered stable to be discharged home.  FOLLOWUP INSTRUCTIONS: 1. The patient to follow up with Dr. Wayland Denis at the Wound Care     Clinic on the 15th of this month at 1:00 p.m.  An appointment has     been made for her and this has been conveyed to the patient as     well. 2. The patient does not have a local PCP in town and as a result she     is to call [facility] at [phone] and make an appointment with     a physician of her choosing. 3. The patient would like to be established with Neurology and as     result she should call [facility] at     [phone] for an appointment in the next few weeks as well.  All of     this has been explained to the patient and she is being discharged     at her own request.  Total time spent for discharge, 45 minutes.     Jeoffrey Massed, MD     SG/MEDQ  D:  08/31/2010  T:  08/31/2010  Job:  [id number]  cc:   Wayland Denis, DO Fax: [phone]  [name], M.D.  Electronically Signed by Jeoffrey Massed  on 09/09/2010 05:37:46 PM

## 2010-09-29 NOTE — H&P (Signed)
[name], [name]            ACCOUNT NO.:  [id number]  MEDICAL RECORD NO.:  [id number]  LOCATION:  [facility]                         FACILITY:  [facility]  PHYSICIAN:  Eduard Clos, MDDATE OF BIRTH:  [birth year]  DATE OF ADMISSION:  08/25/2010 DATE OF DISCHARGE:                             HISTORY & PHYSICAL   PRIMARY CARE PHYSICIAN:  [facility].  Presently, she is looking for another doctor.  CHIEF COMPLAINT:  Increasing pain in the lower extremity with bloody discharge from her chronic wounds.  HISTORY OF PRESENTING ILLNESS:  A 50 year old female with known history of myasthenia gravis, rheumatoid arthritis, thalassemia major, hypertension, asthma, previous history of DVT, has been having chronic lower extremity wounds over last few years, which have worsened over last few days and had some bloody discharge.  The patient also started developing some chest pain and palpitations last evening, which lasted for few minutes and she called EMS and the patient was brought to the ER.  At this time in the ER, the patient was found to be tachycardic. The patient's chest has resolved, but she does have bloody discharge from both wounds and she also has mild leukocytosis.  The patient is admitted for further management of pain, both lower extremity wounds, and also for further management of worsening of the wounds.  The patient did go recently on July 27th to wound care clinic, when she was said that she needs to get Plastic Surgery opinion.  She was supposed to see the plastic surgeon this week.  The patient states the chest pain lasted only for a few minutes, which was retrosternal, pressure-like, which was associated along with palpitation.  At this time her palpitation has decreased, through the patient still has tachycardia.  Denies any shortness of breath, nausea, vomiting, abdominal pain, dysuria, discharge, diarrhea.  Denies any focal deficit, headache, or visual  symptoms.  PAST MEDICAL HISTORY: 1. History of hypertension. 2. Asthma. 3. History of pancreatitis, on doxycycline. 4. 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 5. History of myasthenia gravis. 6. Questionable history of muscular dystrophy. 7. History of obesity. 8. History of anemia. 9. History of cardiac murmur. 10.History of osteoarthritis. 11.History of rheumatoid arthritis. 12.History of right knee cap replacement. 13.History of osteoporosis. 14.History of thalassemia major. 15.History of tinea pedis. 16.History of venous stasis ulcers. 17.History of postphlebitic syndrome. 18.History of left calf ulcer.  PAST SURGICAL HISTORY: 1. Tonsillectomy. 2. Myomectomy. 3. Bronchoscopy with biopsy in the past. 4. Apligraf to the left leg that did not take.  MEDICATIONS PRIOR TO ADMISSION:  The patient is on: 1. Mestinon. 2. Triamterene/hydrochlorothiazide. 3. Oxycodone 10 mg. 4. Ibuprofen 200 mg. 5. Diphenhydramine. 6. Multivitamin. 7. Ferrous sulfate. 8. Vitamin E. 9. Folic acid. 10.B12. 11.Famotidine. 12.Loperamide. 13.Docusate. 14.calcium carbonate. 15.Epinephrine as needed. 16.Albuterol.  ALLERGIES: 1. ACETAMINOPHEN. 2. ALGINATE. 3. ASPIRIN. 4. BACTRIM. 5. DAKIN'S. 6. DILAUDID. 7. ERYTHROMYCIN. 8. INDOMETHACIN. 9. METHOTREXATE.  SOCIAL HISTORY:  The patient does not smoke cigarettes, drink alcohol, or use illegal drugs; was formerly a Warden/ranger, is presently on disability.  FAMILY HISTORY:  Mother died at age 10 with history of breast cancer stage IV,  had some complications from chemo, and had cardiomyopathy. Father died age 9 from stroke.  Also has family history of hypertension, heart disease.  REVIEW OF SYSTEMS:  As per history of presenting illness, nothing else significant.  PHYSICAL EXAMINATION:  GENERAL:  The patient examined at bedside, not in acute distress. VITAL SIGNS:  Blood pressure  is 130/68, pulse 106 per minute, temperature 99.1, respirations 18 per minute, O2 sat 95%. HEENT:  Anicteric.  No pallor.  No discharge from ears, eyes, nose, mouth. CHEST:  Bilateral air entry present.  No rhonchi.  No crepitation. HEART:  S1, S2 heard. ABDOMEN:  Soft, nontender.  Bowel sounds heard. CNS:  Patient alert, awake, oriented to time, place, and person.  Moves upper and lower extremities. EXTREMITIES:  There are chronic skin changes in both lower extremities with 2 big ulcers in both extremities and one on the left is almost measuring 10 x 5 cm, same with one on the right with deep base and granulation tissue with some bloody discharge.  At this time, the toes do not look any acute erythema, drainage, cyanosis, or clubbing.  LABORATORY DATA:  EKG has been ordered, a portable chest x-ray has been ordered, x-rays of the both lower extremities have been ordered.  CBC, WBC is 13, hemoglobin is 12.8, hematocrit is 39.8, platelets 401, neutrophils 82%.  PT/INR are 14.7 and 1.1.  Basic metabolic panel, sodium 132, potassium 3.2, chloride 92, carbon dioxide 23, glucose 120, BUN 35, creatinine 1.1, calcium 10.7, pro-calcium is 0.2, lactic acid 1.2.  ASSESSMENT: 1. Bilateral lower extremity chronic ulcers with increasing pain and     worsening discharge. 2. Chest pain with palpitations. 3. Hypercalcemia. 4. Dehydration. 5. History of myasthenia gravis. 6. History of rheumatoid arthritis. 7. History of thalassemia major. 8. Previous history of deep vein thrombosis. 9. History of asthma.  PLAN: 1. At this time, we will admit the patient to Telemetry. 2. For her bilateral lower extremity wound with increasing discharge,     at this time I will empirically start the patient on vancomycin and     Zosyn.  We will get wound cultures.  We will also get wound     consult.  The patient eventually will need to be seen by plastic     surgeon that was the original plan by the wound  clinic.  At this     time, we are going to get x-rays of the both lower extremities     including the tibia, fibula, and ankle and foot to make sure there     is no bony involvement.  We will follow wound consult     recommendations. 3. Chest pain with palpitation.  At this time, we will get EKG,     cardiac enzymes, portable chest x-ray.  We will check a D-dimer.     At this time, the patient is chest pain-free. 4. Hypercalcemia and dehydration.  I think the patient has     hypercalcemia and probable some dehydration.  We will check a     parathormone level, vitamin D, and phosphorus level.  I think her     calcium may improve with hydration. 5. History of rheumatoid arthritis, myasthenia gravis, asthma,     hypertension.  We will continue her present medications, but we     will hold off diuretics for now. 6. We need to verify her home medication. 7. We will be getting blood cultures and wound cultures. 8. Further recommendation as condition  evolves.     Eduard Clos, MD     ANK/MEDQ  D:  08/26/2010  T:  08/26/2010  Job:  [id number]  Electronically Signed by Midge Minium MD on 09/29/2010 09:04:05 AM

## 2010-10-21 ENCOUNTER — Emergency Department (HOSPITAL_COMMUNITY): Payer: Medicare Other

## 2010-10-21 ENCOUNTER — Inpatient Hospital Stay (HOSPITAL_COMMUNITY)
Admission: EM | Admit: 2010-10-21 | Discharge: 2010-10-23 | DRG: 057 | Disposition: A | Discharge status: Home / Self Care | Payer: Medicare Other | Attending: Neurology | Admitting: Neurology

## 2010-10-21 DIAGNOSIS — E876 Hypokalemia: Secondary | ICD-10-CM | POA: Diagnosis present

## 2010-10-21 DIAGNOSIS — H02409 Unspecified ptosis of unspecified eyelid: Secondary | ICD-10-CM | POA: Diagnosis present

## 2010-10-21 DIAGNOSIS — G7001 Myasthenia gravis with (acute) exacerbation: Principal | ICD-10-CM | POA: Diagnosis present

## 2010-10-21 DIAGNOSIS — I10 Essential (primary) hypertension: Secondary | ICD-10-CM | POA: Diagnosis present

## 2010-10-21 DIAGNOSIS — Z79899 Other long term (current) drug therapy: Secondary | ICD-10-CM

## 2010-10-21 DIAGNOSIS — M069 Rheumatoid arthritis, unspecified: Secondary | ICD-10-CM | POA: Diagnosis present

## 2010-10-21 LAB — POCT I-STAT, CHEM 8
Glucose, Bld: 90 mg/dL (ref 70–99)
HCT: 33 % — ABNORMAL LOW (ref 36.0–46.0)
Hemoglobin: 11.2 g/dL — ABNORMAL LOW (ref 12.0–15.0)
Potassium: 2.7 mEq/L — CL (ref 3.5–5.1)
Sodium: 141 mEq/L (ref 135–145)
TCO2: 24 mmol/L (ref 0–100)

## 2010-10-21 LAB — COMPREHENSIVE METABOLIC PANEL
ALT: 10 U/L (ref 0–35)
AST: 15 U/L (ref 0–37)
Alkaline Phosphatase: 60 U/L (ref 39–117)
CO2: 27 mEq/L (ref 19–32)
Chloride: 102 mEq/L (ref 96–112)
GFR calc Af Amer: >60 mL/min (ref >60)
GFR calc non Af Amer: >60 mL/min (ref >60)
Glucose, Bld: 89 mg/dL (ref 70–99)
Potassium: 2.6 mEq/L — CL (ref 3.5–5.1)
Sodium: 139 mEq/L (ref 135–145)
Total Bilirubin: 0.3 mg/dL (ref 0.3–1.2)

## 2010-10-21 LAB — DIFFERENTIAL
Basophils Relative: 0 % (ref 0–1)
Eosinophils Absolute: 0.1 10*3/uL (ref 0.0–0.7)
Eosinophils Relative: 2 % (ref 0–5)
Monocytes Relative: 10 % (ref 3–12)
Neutrophils Relative %: 73 % (ref 43–77)

## 2010-10-21 LAB — CBC
MCH: 24.1 pg — ABNORMAL LOW (ref 26.0–34.0)
MCHC: 32.9 g/dL (ref 30.0–36.0)
Platelets: 286 10*3/uL (ref 150–400)
RBC: 4.31 MIL/uL (ref 3.87–5.11)
RDW: 17.8 % — ABNORMAL HIGH (ref 11.5–15.5)

## 2010-10-21 LAB — TROPONIN I: Troponin I: <0.3 ng/mL (ref <0.30)

## 2010-10-21 LAB — CK TOTAL AND CKMB (NOT AT ARMC): Total CK: 65 U/L (ref 7–177)

## 2010-10-21 LAB — PROTIME-INR: Prothrombin Time: 13.9 seconds (ref 11.6–15.2)

## 2010-10-22 LAB — URINALYSIS, ROUTINE W REFLEX MICROSCOPIC
Bilirubin Urine: NEGATIVE
Hgb urine dipstick: NEGATIVE
Ketones, ur: NEGATIVE mg/dL
Nitrite: NEGATIVE
Specific Gravity, Urine: 1.013 (ref 1.005–1.030)
pH: 7 (ref 5.0–8.0)

## 2010-10-22 LAB — BASIC METABOLIC PANEL
BUN: 11 mg/dL (ref 6–23)
Creatinine, Ser: 0.85 mg/dL (ref 0.50–1.10)
GFR calc Af Amer: >60 mL/min (ref >60)
GFR calc non Af Amer: >60 mL/min (ref >60)
Glucose, Bld: 108 mg/dL — ABNORMAL HIGH (ref 70–99)
Potassium: 3.2 mEq/L — ABNORMAL LOW (ref 3.5–5.1)

## 2010-10-22 LAB — CBC
HCT: 29.8 % — ABNORMAL LOW (ref 36.0–46.0)
Hemoglobin: 9.5 g/dL — ABNORMAL LOW (ref 12.0–15.0)
MCHC: 31.9 g/dL (ref 30.0–36.0)
MCV: 73.9 fL — ABNORMAL LOW (ref 78.0–100.0)
RDW: 18.2 % — ABNORMAL HIGH (ref 11.5–15.5)

## 2010-10-22 LAB — COMPREHENSIVE METABOLIC PANEL
ALT: 11 U/L (ref 0–35)
Alkaline Phosphatase: 56 U/L (ref 39–117)
BUN: 9 mg/dL (ref 6–23)
CO2: 26 mEq/L (ref 19–32)
GFR calc Af Amer: >60 mL/min (ref >60)
GFR calc non Af Amer: >60 mL/min (ref >60)
Glucose, Bld: 90 mg/dL (ref 70–99)
Potassium: 3.2 mEq/L — ABNORMAL LOW (ref 3.5–5.1)
Sodium: 140 mEq/L (ref 135–145)
Total Bilirubin: 0.3 mg/dL (ref 0.3–1.2)

## 2010-10-23 LAB — BASIC METABOLIC PANEL
BUN: 11 mg/dL (ref 6–23)
CO2: 28 mEq/L (ref 19–32)
Chloride: 105 mEq/L (ref 96–112)
Creatinine, Ser: 0.88 mg/dL (ref 0.50–1.10)

## 2010-10-23 NOTE — H&P (Signed)
[name], [name]            ACCOUNT NO.:  [id number]  MEDICAL RECORD NO.:  [id number]  LOCATION:  [facility]                         FACILITY:  [facility]  PHYSICIAN:  Thana Farr, MD    DATE OF BIRTH:  [birth year]  DATE OF ADMISSION:  10/21/2010 DATE OF DISCHARGE:                             HISTORY & PHYSICAL   REASON FOR CONSULTATION:  The patient was initially brought as a code stroke, later found to have myasthenia gravis exacerbation.  HISTORY OF PRESENT ILLNESS:  This is a pleasant 50 year old African American female with a past medical history of bilateral lower extremity with venous stasis and ulcers, hypertension, asthma, myasthenia gravis, obesity, anemia, osteoarthritis, history of thalassemia major, rheumatoid arthritis.  The patient has history of myasthenia and does not have a formal primary care giver.  She takes Mestinon 60 mg q.6 h.  This last Friday the patient states that she has ran out of her medications and has not taken the Mestinon.  Today, the patient called EMS as she noted she was having slurred speech and headache.  At 1330 hours EMS arrived and the patient started to get very anxious that she is going to loose capabilities of expressing her past medical history secondary to myasthenia crisis.  The patient was initially started to be driven to the hospital under those pretenses but later was called the code stroke.  Upon arrival at the emergency department, the patient's initial CT scan showed no intracranial pathology and it was found that the patient had not taken her medications for myasthenia since Friday.  On exam, the patient did show right eye ptosis.  Her airway was clear.  She is having no difficulty with her oral secretions.  She was not drooling.  She was able to move bilateral extremities.  Sensation was grossly intact.  Code stroke was called off and the patient was treated as a myasthenia crisis.  PAST MEDICAL HISTORY:  The patient has  bilateral period lower extremity venous stasis and ulcers, hypertension, asthma, myasthenia gravis, obesity, anemia, osteoarthritis, thalassemia major, and rheumatoid arthritis.  Stroke risk factors include obesity, hypertension.  MEDICATIONS:  Since discharge in August 2012 from discharge note, the patient was discharged on garlic extract, Dilantin, hydrocortisone ointment for daily wounds, lidocaine, multivitamins, oxycodone, Maxzide, vitamin A, vitamin B12, vitamin E, zinc oxide, Mestinon 60 mg which she takes q.6 h.  ALLERGIES:  She is allergic to PREDNISONE, SILVER, TRAMADOL, VICODIN, TYLENOL, ASPIRIN, BACTRIM, DILAUDID, INDOMETHACIN, METHOTREXATE, NEOSPORIN, and PERCOCET.  FAMILY HISTORY:  Noncontributory.  SOCIAL HISTORY:  The patient lives alone.  She does not smoke, drink or do illicit drugs.  REVIEW OF SYSTEMS:  Negative with the exception of above.  On physical exam; temperature is 99.6, blood pressure is 176/93, heart rate 93, respirations 20.  Lungs clear to auscultation bilaterally. Neck is negative for bruits.  Cardiovascular regular rate and rhythm. Abdomen is soft, nontender, nondistended.  Neurological exam.  On neurological exam, the patient does show a positive right eye ptosis and right exophthalmos.  Her visual fields are grossly intact.  Her extraocular movements are intact.  Her tongue is midline.  Face shows equal sensation bilaterally to pinprick.  Her speech is clear.  She shows no drooling, no coughing, and she is handling her oral secretions without any difficulty.  She is moving her upper extremities bilaterally with 5/5 strength and her lower extremities bilaterally with a 4+/5 strength.  She is unable to bend her right knee secondary to her arthritis but has no difficulty bending any other joints.  Sensation is grossly intact throughout.  Her deep tendon reflexes are depressed throughout.  She is downgoing toes bilaterally. At present time the  patient is breathing on her own and having no difficulties with her respirations.  Test results; sodium 141, potassium 2.7, chloride 103, BUN 14, creatinine 1.0, glucose 90.  White blood cell count is 7.5, platelets 286, hemoglobin 0.2, hematocrit 33.0.  EKG shows normal sinus rhythm.  CT scan of head was negative for any intracranial abnormalities.  ASSESSMENT:  1.This is a pleasant 50 year old female with known myasthenia gravis who has not been taking her medications for 5 days.  At present time she is exhibiting signs of exacerbation of her myasthenia.  However, she is able to handle her airway and secretions.                2. Hypokalemia       3. Hypertension  PLAN 1. Admit to monitor until routine meds initiated fully.  We will admit this patient to the hospital, 2. Start Mestinon 60 mg q.6 h. 3. Daily NIF and vital capacity.   4. Restart Maxzide 75/50  5. TSH.      [name] Morn, PA-C   ______________________________ Thana Farr, MD    DS/MEDQ  D:  10/21/2010  T:  10/21/2010  Job:  [id number]  Electronically Signed by [name] Morn PA-C on 10/23/2010 11:47:48 AM Electronically Signed by Thana Farr MD on 10/23/2010 02:40:48 PM

## 2010-10-23 NOTE — Discharge Summary (Signed)
[name], [name]            ACCOUNT NO.:  [id number]  MEDICAL RECORD NO.:  [id number]  LOCATION:  3040                         FACILITY:  [facility]  PHYSICIAN:  Thana Farr, MD    DATE OF BIRTH:  [birth year]  DATE OF ADMISSION:  10/21/2010 DATE OF DISCHARGE:  10/23/2010                              DISCHARGE SUMMARY   ADMITTING DIAGNOSIS:  Myasthenia exacerbation.  DISCHARGE DIAGNOSIS:  Myasthenia exacerbation.  BRIEF HISTORY OF PRESENT ILLNESS:  This is a pleasant 50 year old female with past medical history of bilateral lower extremity venous stasis ulcers, hypertension, asthma, myasthenia gravis, obesity, anemia, osteoarthritis, history of thalassemia major and rheumatoid arthritis. The patient was brought to the emergency department on October 21, 2010, initially believed to have a code stroke; however, after further examination, it was noted that the patient was actually having myasthenia exacerbation.  The patient stated that she had not been taking her Mestinon for the past 5 days.  Her usual dose is 60 mg q.6 h. Her symptoms at time of examination were right eye ptosis, diplopia and shortness of breath.  On initial examination, the patient was not showing any signs of drooling and she was not having any difficulty with her oral secretions.  The patient was admitted to the hospital on the neuro hospital team and Mestinon was given on admission.  Hospital stay was complicated by hypertension and hypokalemia.  Home medication for HTN was started at admission and controlled the patient's hypertension.  Intravenous and p.o. potassium was given a well.  After 24 hours potassium had returned to normal  (3.6).    While the patient was admitted, a wound care consultation was initiated.  The patient's bilateral lower extremity wounds were redressed and it was recommended that the patient be given viscous lidocaine to be applied topically with daily dressings.  In addition,  Social Work evaluated the patient and need a followup appointment for the patient at [facility] on November 06, 2010 for follow up.  PHYSICAL EXAMINATION AT TIME OF DISCHARGE:  The patient is alert.  She is oriented x3.  Face is equal and symmetrical.  The patient shows no ptosis and no diplopia.  Vision is intact, double simultaneous stimulation.  Speech is clear.  Respiratory effort is good.  She was evaluated by Respiratory Therapy this morning and showed a NIF of negative 53, negative 65, negative 50, with a vital capacity of 0.62, 0.92 liters, and 1.16 liters.  The patient is moving all extremities 5/5.  Sensation is grossly intact.  Deep tendon reflexes are depressed throughout and she has downgoing toes bilaterally.  LABS AT TIME OF DISCHARGE:  Sodium is 139, potassium 3.6, chloride 105, CO2 is 28, glucose is 99, BUN is 11, creatinine 0.88, calcium is 8.9. White blood cell count is 4.7, hemoglobin 9.5, hematocrit 29.8, platelets are 217.  TSH on October 21, 2010, showed to be 0.655.  CT of head at the time of admission showed no acute intracranial abnormalities, stable examination.  She did show mild bilateral carotid siphon and left vertebral atherosclerosis.  MEDICATIONS AT TIME OF DISCHARGE:  The patient will be discharged on: 1. Albuterol inhaler two puffs inhaled every 4 hours as  needed. 2. Diphenhydramine 25 mg two tablets by mouth daily as needed. 3. Docusate 100 mg one capsule by mouth daily as needed. 4. EpiPen one injection subcutaneously daily as needed. 5. Ferrous sulfate 325 mg one tablet by mouth daily. 6. Fish oil over-the-counter one capsule by mouth daily. 7. Folic acid 0.4 mg one tablet by mouth daily. 8. Garlic extract 3 mg two tablets by mouth daily. 9. Gelatine over-the-counter one to two tablets by mouth daily. 10.Hydrocortisone ointment to be applied to lower extremities     topically at time of dressing change. 11.Lidocaine topical  application applied daily to wound at dressing     change. 12.Mestinon 60 mg one tablet by mouth four times daily. 13.Multivitamin. 14.Oxycodone 5 mg two tablets by mouth every 4 hours as needed. 15.Triamterene and hydrochlorothiazide 75 - 50 mg one tablet by mouth     daily at bedtime. 16.Vitamin B12. 17.Vitamin C. 18.Vitamin E. 19.Zinc oxide.  Scripts written at time of discharge include: 1. Viscous lidocaine tube to be applied topically with dressing     changes, one tube with no refills. 2. Mestinon 60 mg, the patient is to take one tablet by mouth every 4     times daily, dispense quantity is 120 with three refills. 3. Oxycodone 5 mg, the patient is to take two tablets by mouth every 4     hours as needed, quantity of 38 with no refills.  DISPOSITION:  The patient will be discharged home and she has a followup appointment with [facility] on November 06, 2010.     Felicie Morn, PA-C   ______________________________ Thana Farr, MD    DS/MEDQ  D:  10/23/2010  T:  10/23/2010  Job:  [id number]  Electronically Signed by Felicie Morn PA-C on 10/23/2010 11:50:29 AM Electronically Signed by Thana Farr MD on 10/23/2010 02:43:04 PM

## 2010-12-14 NOTE — H&P (Signed)
  [name], [name]            ACCOUNT NO.:  [id number]  MEDICAL RECORD NO.:  [id number]  LOCATION:                                 FACILITY:  PHYSICIAN:  [name], M.D.     DATE OF BIRTH:  Jul 14, 1960  DATE OF ADMISSION: DATE OF DISCHARGE:                             HISTORY & PHYSICAL   This is a 50 year old African American female who is quite obese, has a history of myasthenia gravis, hypertension, deep venous thrombosis of her femoral system.  She also has osteoarthritis, rheumatoid arthritis, thalassemia major, and she says muscular dystrophy.  She has had chronic ulcers, venous stasis type, worse on the left than the right, the left is almost circumferential all around her ankle and lower calf.  She states that these have been treated before with Apligraf.  She has never had skin grafts.  She has had [facility] in the past and she states that the best treatment that helped her last time was [employer] and that [employer] have been placed now and then for many years.  She is on metoprolol for hypertension.  She takes oxycodone for pain.  She takes pyridostigmine for her myasthenia and she takes hydrochlorothiazide. She also takes vitamins.  Her EKG was sent over here and she has a left ventricular hypertrophy and some ST abnormalities.  She has had multiple surgeries before, but none of any real significant.  She has had a T and A, breast biopsies, and bronchoscopy.  On this first visit, we evaluated her legs.  I can feel excellent pedal pulses, excellent popliteal pulses.  She does not have any edema and she states that she had ultrasonic evaluation of her venous system about 6 months ago and she states that it was reported to her that the veins were normal.  I can feel both popliteal, dorsalis pedis, and posterior tibial pulses.  The ulcers are quite deep, but no tendons are visible.  There is pretty good granulation tissue, but there is also some necrotic tissue that  I attempted under local anesthesia to debride, but she could not tolerate it, so we put back on the Unna boots with some collagen, and also put some Santyl and Profore light.  I thought it would be beneficial for Dr. Kelly Splinter to take a look to see if she thought she was a candidate for skin grafts, so she is coming back next week to see Dr. Kelly Splinter.  She wanted something for pain, so I gave her some Tylox, so her diagnoses are obesity, venous stasis ulcers, myasthenia, rheumatoid arthritis, hypertension, and Dr. Kelly Splinter will see her next week to evaluate.     [name], M.D.     PP/MEDQ  D:  08/21/2010  T:  08/21/2010  Job:  [id number]  Electronically Signed by [name]  on 12/14/2010 02:41:45 PM

## 2011-03-02 DIAGNOSIS — J45909 Unspecified asthma, uncomplicated: Secondary | ICD-10-CM | POA: Diagnosis not present

## 2011-03-02 DIAGNOSIS — IMO0002 Reserved for concepts with insufficient information to code with codable children: Secondary | ICD-10-CM | POA: Diagnosis not present

## 2011-03-02 DIAGNOSIS — Z09 Encounter for follow-up examination after completed treatment for conditions other than malignant neoplasm: Secondary | ICD-10-CM | POA: Diagnosis not present

## 2011-03-02 DIAGNOSIS — M069 Rheumatoid arthritis, unspecified: Secondary | ICD-10-CM | POA: Diagnosis not present

## 2011-03-02 DIAGNOSIS — H2510 Age-related nuclear cataract, unspecified eye: Secondary | ICD-10-CM | POA: Diagnosis not present

## 2011-03-02 DIAGNOSIS — Z888 Allergy status to other drugs, medicaments and biological substances status: Secondary | ICD-10-CM | POA: Diagnosis not present

## 2011-03-02 DIAGNOSIS — Z86718 Personal history of other venous thrombosis and embolism: Secondary | ICD-10-CM | POA: Diagnosis not present

## 2011-03-02 DIAGNOSIS — L97909 Non-pressure chronic ulcer of unspecified part of unspecified lower leg with unspecified severity: Secondary | ICD-10-CM | POA: Diagnosis not present

## 2011-03-02 DIAGNOSIS — I10 Essential (primary) hypertension: Secondary | ICD-10-CM | POA: Diagnosis not present

## 2011-03-02 DIAGNOSIS — Z886 Allergy status to analgesic agent status: Secondary | ICD-10-CM | POA: Diagnosis not present

## 2011-03-02 DIAGNOSIS — D509 Iron deficiency anemia, unspecified: Secondary | ICD-10-CM | POA: Diagnosis not present

## 2011-03-02 DIAGNOSIS — Z79899 Other long term (current) drug therapy: Secondary | ICD-10-CM | POA: Diagnosis not present

## 2011-05-31 DIAGNOSIS — I10 Essential (primary) hypertension: Secondary | ICD-10-CM | POA: Diagnosis not present

## 2011-05-31 DIAGNOSIS — G7000 Myasthenia gravis without (acute) exacerbation: Secondary | ICD-10-CM | POA: Diagnosis not present

## 2011-06-07 DIAGNOSIS — S81809A Unspecified open wound, unspecified lower leg, initial encounter: Secondary | ICD-10-CM | POA: Diagnosis not present

## 2011-06-07 DIAGNOSIS — M79609 Pain in unspecified limb: Secondary | ICD-10-CM | POA: Diagnosis not present

## 2011-06-07 DIAGNOSIS — Z79899 Other long term (current) drug therapy: Secondary | ICD-10-CM | POA: Diagnosis not present

## 2011-06-07 DIAGNOSIS — L97909 Non-pressure chronic ulcer of unspecified part of unspecified lower leg with unspecified severity: Secondary | ICD-10-CM | POA: Diagnosis not present

## 2011-06-07 DIAGNOSIS — R609 Edema, unspecified: Secondary | ICD-10-CM | POA: Diagnosis not present

## 2011-06-07 DIAGNOSIS — G7000 Myasthenia gravis without (acute) exacerbation: Secondary | ICD-10-CM | POA: Diagnosis not present

## 2011-06-07 DIAGNOSIS — I87019 Postthrombotic syndrome with ulcer of unspecified lower extremity: Secondary | ICD-10-CM | POA: Diagnosis not present

## 2011-11-11 ENCOUNTER — Emergency Department (INDEPENDENT_AMBULATORY_CARE_PROVIDER_SITE_OTHER)
Admission: EM | Admit: 2011-11-11 | Discharge: 2011-11-11 | Disposition: A | Discharge status: Nursing Home (Includes ICF) | Payer: Medicare Other | Source: Home / Self Care | Attending: Emergency Medicine | Admitting: Emergency Medicine

## 2011-11-11 ENCOUNTER — Encounter (HOSPITAL_COMMUNITY): Payer: Self-pay | Admitting: Emergency Medicine

## 2011-11-11 ENCOUNTER — Encounter (HOSPITAL_COMMUNITY): Payer: Self-pay | Admitting: Family Medicine

## 2011-11-11 ENCOUNTER — Inpatient Hospital Stay (HOSPITAL_COMMUNITY)
Admission: EM | Admit: 2011-11-11 | Discharge: 2011-11-16 | DRG: 300 | Disposition: A | Discharge status: Home Health | Payer: Medicare Other | Attending: Internal Medicine | Admitting: Internal Medicine

## 2011-11-11 DIAGNOSIS — L97909 Non-pressure chronic ulcer of unspecified part of unspecified lower leg with unspecified severity: Secondary | ICD-10-CM | POA: Diagnosis not present

## 2011-11-11 DIAGNOSIS — L03119 Cellulitis of unspecified part of limb: Secondary | ICD-10-CM | POA: Diagnosis not present

## 2011-11-11 DIAGNOSIS — L0291 Cutaneous abscess, unspecified: Secondary | ICD-10-CM | POA: Diagnosis not present

## 2011-11-11 DIAGNOSIS — N179 Acute kidney failure, unspecified: Secondary | ICD-10-CM | POA: Diagnosis not present

## 2011-11-11 DIAGNOSIS — I10 Essential (primary) hypertension: Secondary | ICD-10-CM | POA: Diagnosis not present

## 2011-11-11 DIAGNOSIS — E860 Dehydration: Secondary | ICD-10-CM | POA: Diagnosis not present

## 2011-11-11 DIAGNOSIS — I872 Venous insufficiency (chronic) (peripheral): Secondary | ICD-10-CM | POA: Diagnosis not present

## 2011-11-11 DIAGNOSIS — Z888 Allergy status to other drugs, medicaments and biological substances status: Secondary | ICD-10-CM | POA: Diagnosis not present

## 2011-11-11 DIAGNOSIS — I878 Other specified disorders of veins: Secondary | ICD-10-CM

## 2011-11-11 DIAGNOSIS — E669 Obesity, unspecified: Secondary | ICD-10-CM | POA: Diagnosis present

## 2011-11-11 DIAGNOSIS — I83009 Varicose veins of unspecified lower extremity with ulcer of unspecified site: Secondary | ICD-10-CM | POA: Diagnosis present

## 2011-11-11 DIAGNOSIS — G7000 Myasthenia gravis without (acute) exacerbation: Secondary | ICD-10-CM | POA: Diagnosis not present

## 2011-11-11 DIAGNOSIS — L0390 Cellulitis, unspecified: Secondary | ICD-10-CM | POA: Diagnosis not present

## 2011-11-11 DIAGNOSIS — L97809 Non-pressure chronic ulcer of other part of unspecified lower leg with unspecified severity: Secondary | ICD-10-CM | POA: Diagnosis present

## 2011-11-11 DIAGNOSIS — L97919 Non-pressure chronic ulcer of unspecified part of right lower leg with unspecified severity: Secondary | ICD-10-CM | POA: Diagnosis present

## 2011-11-11 DIAGNOSIS — E876 Hypokalemia: Secondary | ICD-10-CM | POA: Diagnosis present

## 2011-11-11 DIAGNOSIS — Z881 Allergy status to other antibiotic agents status: Secondary | ICD-10-CM | POA: Diagnosis not present

## 2011-11-11 DIAGNOSIS — Z6834 Body mass index (BMI) 34.0-34.9, adult: Secondary | ICD-10-CM | POA: Diagnosis not present

## 2011-11-11 DIAGNOSIS — Z886 Allergy status to analgesic agent status: Secondary | ICD-10-CM | POA: Diagnosis not present

## 2011-11-11 DIAGNOSIS — L97929 Non-pressure chronic ulcer of unspecified part of left lower leg with unspecified severity: Secondary | ICD-10-CM

## 2011-11-11 DIAGNOSIS — I509 Heart failure, unspecified: Secondary | ICD-10-CM | POA: Diagnosis present

## 2011-11-11 DIAGNOSIS — J45909 Unspecified asthma, uncomplicated: Secondary | ICD-10-CM | POA: Diagnosis present

## 2011-11-11 DIAGNOSIS — M79609 Pain in unspecified limb: Secondary | ICD-10-CM | POA: Diagnosis not present

## 2011-11-11 DIAGNOSIS — L02419 Cutaneous abscess of limb, unspecified: Secondary | ICD-10-CM | POA: Diagnosis not present

## 2011-11-11 DIAGNOSIS — M7989 Other specified soft tissue disorders: Secondary | ICD-10-CM | POA: Diagnosis not present

## 2011-11-11 DIAGNOSIS — Z79899 Other long term (current) drug therapy: Secondary | ICD-10-CM

## 2011-11-11 HISTORY — DX: Cardiac murmur, unspecified: R01.1

## 2011-11-11 HISTORY — DX: Unspecified asthma, uncomplicated: J45.909

## 2011-11-11 HISTORY — DX: Other specified disorders of veins: I87.8

## 2011-11-11 HISTORY — DX: Heart failure, unspecified: I50.9

## 2011-11-11 HISTORY — DX: Essential (primary) hypertension: I10

## 2011-11-11 HISTORY — DX: Myasthenia gravis without (acute) exacerbation: G70.00

## 2011-11-11 LAB — CBC
Hemoglobin: 9.9 g/dL — ABNORMAL LOW (ref 12.0–15.0)
MCHC: 31.9 g/dL (ref 30.0–36.0)
RBC: 4.38 MIL/uL (ref 3.87–5.11)
WBC: 6.8 10*3/uL (ref 4.0–10.5)

## 2011-11-11 LAB — CBC WITH DIFFERENTIAL/PLATELET
Basophils Absolute: 0 10*3/uL (ref 0.0–0.1)
Eosinophils Relative: 3 % (ref 0–5)
HCT: 33.6 % — ABNORMAL LOW (ref 36.0–46.0)
Lymphocytes Relative: 15 % (ref 12–46)
MCHC: 32.4 g/dL (ref 30.0–36.0)
MCV: 71.5 fL — ABNORMAL LOW (ref 78.0–100.0)
Monocytes Absolute: 0.7 10*3/uL (ref 0.1–1.0)
RDW: 19.3 % — ABNORMAL HIGH (ref 11.5–15.5)

## 2011-11-11 LAB — BASIC METABOLIC PANEL
BUN: 31 mg/dL — ABNORMAL HIGH (ref 6–23)
CO2: 22 mEq/L (ref 19–32)
Calcium: 9.8 mg/dL (ref 8.4–10.5)
Creatinine, Ser: 1.67 mg/dL — ABNORMAL HIGH (ref 0.50–1.10)

## 2011-11-11 LAB — CREATININE, SERUM
Creatinine, Ser: 1.65 mg/dL — ABNORMAL HIGH (ref 0.50–1.10)
GFR calc Af Amer: 40 mL/min — ABNORMAL LOW (ref >90)
GFR calc non Af Amer: 35 mL/min — ABNORMAL LOW (ref >90)

## 2011-11-11 MED ORDER — VANCOMYCIN HCL IN DEXTROSE 1-5 GM/200ML-% IV SOLN
1000.0000 mg | Freq: Once | INTRAVENOUS | Status: AC
  Administered 2011-11-11: 1000 mg via INTRAVENOUS
  Filled 2011-11-11: qty 200

## 2011-11-11 MED ORDER — FERROUS SULFATE 325 (65 FE) MG PO TABS
325.0000 mg | ORAL_TABLET | Freq: Every day | ORAL | Status: DC
  Administered 2011-11-12 – 2011-11-15 (×4): 325 mg via ORAL
  Filled 2011-11-11 (×5): qty 2

## 2011-11-11 MED ORDER — OXYCODONE HCL 5 MG PO TABS
5.0000 mg | ORAL_TABLET | Freq: Four times a day (QID) | ORAL | Status: DC | PRN
  Administered 2011-11-11 – 2011-11-12 (×4): 10 mg via ORAL
  Filled 2011-11-11 (×4): qty 2

## 2011-11-11 MED ORDER — SODIUM CHLORIDE 0.9 % IJ SOLN
3.0000 mL | INTRAMUSCULAR | Status: DC | PRN
  Administered 2011-11-15: 3 mL via INTRAVENOUS

## 2011-11-11 MED ORDER — ALBUTEROL SULFATE (5 MG/ML) 0.5% IN NEBU
2.5000 mg | INHALATION_SOLUTION | RESPIRATORY_TRACT | Status: DC | PRN
  Administered 2011-11-13: 2.5 mg via RESPIRATORY_TRACT
  Filled 2011-11-11: qty 0.5

## 2011-11-11 MED ORDER — SODIUM CHLORIDE 0.9 % IJ SOLN
3.0000 mL | Freq: Two times a day (BID) | INTRAMUSCULAR | Status: DC
  Administered 2011-11-11 – 2011-11-16 (×8): 3 mL via INTRAVENOUS

## 2011-11-11 MED ORDER — VANCOMYCIN HCL 1000 MG IV SOLR
1500.0000 mg | INTRAVENOUS | Status: DC
  Administered 2011-11-12 – 2011-11-13 (×2): 1500 mg via INTRAVENOUS
  Filled 2011-11-11 (×3): qty 1500

## 2011-11-11 MED ORDER — NIVEA EX CREA
1.0000 "application " | TOPICAL_CREAM | Freq: Every day | CUTANEOUS | Status: DC
  Filled 2011-11-11: qty 120

## 2011-11-11 MED ORDER — ONDANSETRON HCL 4 MG PO TABS: 4.0000 mg | ORAL_TABLET | Freq: Four times a day (QID) | ORAL | Status: DC | PRN

## 2011-11-11 MED ORDER — ENOXAPARIN SODIUM 40 MG/0.4ML ~~LOC~~ SOLN
40.0000 mg | SUBCUTANEOUS | Status: DC
  Administered 2011-11-11 – 2011-11-15 (×5): 40 mg via SUBCUTANEOUS
  Filled 2011-11-11 (×6): qty 0.4

## 2011-11-11 MED ORDER — DIPHENHYDRAMINE HCL 25 MG PO TABS
25.0000 mg | ORAL_TABLET | Freq: Four times a day (QID) | ORAL | Status: DC | PRN
  Administered 2011-11-11 – 2011-11-16 (×5): 50 mg via ORAL
  Filled 2011-11-11 (×6): qty 2

## 2011-11-11 MED ORDER — SENNA 8.6 MG PO TABS
1.0000 | ORAL_TABLET | Freq: Every day | ORAL | Status: DC
  Administered 2011-11-12 – 2011-11-14 (×3): 8.6 mg via ORAL
  Filled 2011-11-11 (×7): qty 1

## 2011-11-11 MED ORDER — BISACODYL 5 MG PO TBEC: 10.0000 mg | DELAYED_RELEASE_TABLET | Freq: Every day | ORAL | Status: DC | PRN

## 2011-11-11 MED ORDER — ONDANSETRON HCL 4 MG/2ML IJ SOLN: 4.0000 mg | Freq: Four times a day (QID) | INTRAMUSCULAR | Status: DC | PRN

## 2011-11-11 MED ORDER — SODIUM CHLORIDE 0.9 % IV SOLN: 250.0000 mL | INTRAVENOUS | Status: DC | PRN

## 2011-11-11 MED ORDER — POTASSIUM CHLORIDE CRYS ER 20 MEQ PO TBCR
40.0000 meq | EXTENDED_RELEASE_TABLET | ORAL | Status: AC
  Administered 2011-11-11 – 2011-11-12 (×3): 40 meq via ORAL
  Filled 2011-11-11 (×3): qty 2

## 2011-11-11 MED ORDER — ALBUTEROL SULFATE HFA 108 (90 BASE) MCG/ACT IN AERS: 2.0000 | INHALATION_SPRAY | RESPIRATORY_TRACT | Status: DC | PRN

## 2011-11-11 MED ORDER — OXYCODONE HCL 5 MG PO TABS
10.0000 mg | ORAL_TABLET | Freq: Once | ORAL | Status: AC
  Administered 2011-11-11: 10 mg via ORAL
  Filled 2011-11-11: qty 2

## 2011-11-11 MED ORDER — ADULT MULTIVITAMIN W/MINERALS CH
1.0000 | ORAL_TABLET | Freq: Every day | ORAL | Status: DC
  Administered 2011-11-11 – 2011-11-15 (×5): 1 via ORAL
  Filled 2011-11-11 (×5): qty 1

## 2011-11-11 MED ORDER — PYRIDOSTIGMINE BROMIDE 60 MG PO TABS
60.0000 mg | ORAL_TABLET | Freq: Four times a day (QID) | ORAL | Status: DC
  Administered 2011-11-11 – 2011-11-16 (×20): 60 mg via ORAL
  Filled 2011-11-11 (×22): qty 1

## 2011-11-11 MED ORDER — LABETALOL HCL 100 MG PO TABS
100.0000 mg | ORAL_TABLET | Freq: Two times a day (BID) | ORAL | Status: DC
  Administered 2011-11-12: 100 mg via ORAL
  Filled 2011-11-11 (×3): qty 1

## 2011-11-11 MED ORDER — MORPHINE SULFATE 2 MG/ML IJ SOLN
2.0000 mg | INTRAMUSCULAR | Status: DC | PRN
  Administered 2011-11-11 – 2011-11-12 (×3): 2 mg via INTRAVENOUS
  Filled 2011-11-11 (×3): qty 1

## 2011-11-11 NOTE — ED Provider Notes (Signed)
History     CSN: [id number]  Arrival date & time 11/11/11  1226   First MD Initiated Contact with Patient 11/11/11 1329      Chief Complaint  Patient presents with  . Leg Pain     Patient is a 51 y.o. female presenting with leg pain. The history is provided by the patient.  Leg Pain  The incident occurred more than 1 week ago. There was no injury mechanism. Pain location: bilateral LE. The pain is moderate. The pain has been constant since onset. Pertinent negatives include no numbness. The symptoms are aggravated by activity. She has tried rest for the symptoms. The treatment provided no relief.  pt prsents from [facility] for concern for cellulitis/ulcerations of legs She reports she has had this for "awhile" but reports recent worsening No fever/cp/sob No weakness No abd pain No recent trauma to her legs   Past Medical History  Diagnosis Date  . Asthma   . Hypertension   . CHF (congestive heart failure)   . Myasthenia gravis   . Heart murmur   . Venous stasis     Past Surgical History  Procedure Date  . Tonsillectomy   . Myomectomy     History reviewed. No pertinent family history.  History  Substance Use Topics  . Smoking status: Never Smoker   . Smokeless tobacco: Not on file  . Alcohol Use: No    OB History    Grav Para Term Preterm Abortions TAB SAB Ect Mult Living                  Review of Systems  Constitutional: Negative for fever.  Respiratory: Negative for shortness of breath.   Cardiovascular: Negative for chest pain.  Gastrointestinal: Negative for vomiting and abdominal pain.  Skin: Positive for wound.  Neurological: Negative for weakness and numbness.  All other systems reviewed and are negative.    Allergies  Asa; Dilaudid; Erythromycin; Indomethacin; Naproxen; Alginate-collagen; Gold salts; Silvadene; and Tylenol  Home Medications   Current Outpatient Rx  Name Route Sig Dispense Refill  . ALBUTEROL SULFATE HFA 108 (90 BASE)  MCG/ACT IN AERS Inhalation Inhale 4 puffs into the lungs every 6 (six) hours as needed. For shortness of breath    . COD LIVER OIL PO Oral Take 2 capsules by mouth daily.    Marland Kitchen DIPHENHYDRAMINE HCL 25 MG PO TABS Oral Take 50 mg by mouth 4 (four) times daily.    Marland Kitchen NIVEA EX CREA Topical Apply 1 application topically daily. Apply all over body    . EPINEPHRINE 0.3 MG/0.3ML IJ DEVI Intramuscular Inject 0.3 mg into the muscle as needed. For allergic reaction    . FERROUS SULFATE 325 (65 FE) MG PO TABS Oral Take 325-650 mg by mouth daily with breakfast.    . GARLIC OIL 1000 MG PO CAPS Oral Take 1,000 mg by mouth daily.    . IBUPROFEN 200 MG PO TABS Oral Take 2,000 mg by mouth 4 (four) times daily. Takes 10 tablets to get 2000mg     . ADULT MULTIVITAMIN W/MINERALS CH Oral Take 1 tablet by mouth daily. 1 gummy    . FISH OIL 500 MG PO CAPS Oral Take 1,000 mg by mouth daily.    . TRIAMTERENE-HCTZ 75-50 MG PO TABS Oral Take 1 tablet by mouth daily.    Marland Kitchen VITAMIN E 100 UNITS PO CAPS Oral Take 100 Units by mouth daily.    Marland Kitchen PYRIDOSTIGMINE BROMIDE 60 MG PO TABS Oral Take  60 mg by mouth 4 (four) times daily.      BP 128/79  Pulse 97  Temp 97.8 F (36.6 C) (Oral)  Resp 22  SpO2 97%  LMP 07/12/2011  Physical Exam CONSTITUTIONAL: Well developed/well nourished HEAD AND FACE: Normocephalic/atraumatic EYES: EOMI/PERRL ENMT: Mucous membranes moist NECK: supple no meningeal signs SPINE:entire spine nontender CV: S1/S2 noted, no murmurs/rubs/gallops noted LUNGS: Lungs are clear to auscultation bilaterally, no apparent distress ABDOMEN: soft, nontender, no rebound or guarding GU:no cva tenderness NEURO: Pt is awake/alert, moves all extremitiesx4 EXTREMITIES:distal pulses intact on LE.  She has large ulcerations with erythema noted to bilateral LE with fluid wheeping from wounds.  No crepitance.  There are deep ulcerations noted to both legs SKIN: warm, color normal PSYCH: no abnormalities of mood noted  ED  Course  Procedures    Labs Reviewed  CBC WITH DIFFERENTIAL  BASIC METABOLIC PANEL   1:61 PM Pt with worsening wounds to bilateral LE will need inpatient wound care and antibiotics   D/w triad dr Hassell Halim will admit to medicine Pt stable at this time   MDM  Nursing notes including past medical history and social history reviewed and considered in documentation Previous records reviewed and considered - notes from [facility] noted Labs/vital reviewed and considered         Joya Gaskins, MD 11/11/11 1514

## 2011-11-11 NOTE — ED Notes (Signed)
Pt has wounds on BLE and now she has severe pain and swelling in both legs. Pt sent here from [facility] to R/O DVT vs cellulitis

## 2011-11-11 NOTE — H&P (Addendum)
[facility] History and Physical  Cortlyn Brumbach NWG:[id number] DOB: [birth year] DOA: 11/11/2011  Referring physician: Dr Bebe Shaggy PCP: Sheila Oats, MD  Specialists: none  Chief Complaint: Increased drainage from bilateral leg ulcers, and increased bilateral leg swelling  HPI: [name] is a 51 y.o. female with history of venous stasis and extensive bilateral lower extremity ulcers since 2006 who presents with above complaints. She states that she has had multiple skin grafts over the years that have failed--first in [place] in 2008, and then in 2009, 10, 11 she had skin grafts done at [facility] and [facility] but they all failed. She reports she has been seen at the wound care Center over the years and differentiate treatments have been tried, but that her last visit there was in June. She reports that DuoDERM has seemed to work in the past but she has not been able to afford them due to no insurance-has not had any since September. In the past 2-3 weeks she states that she has had increasing drainage-pus- like and blood tinged from the leg ulcers as well as increased pain and swelling. She reports that because of the pain and swelling she has had difficulty ambulating. She denies any change in her baseline shortness of breath which she attributes to her weight and history of asthma. She denies fevers. Patient also denies chest pain. She was seen in the ED and a white cell count was normal, but because of the extensive draining ulcers/cellulitis she was admitted for further evaluation and management.   Review of Systems: The patient denies anorexia, fever, weight loss,, vision loss, decreased hearing, hoarseness, chest pain, syncope, dyspnea on exertion, peripheral edema, balance deficits, hemoptysis, abdominal pain, melena, hematochezia, severe indigestion/heartburn, hematuria, incontinence,change, abnormal bleeding, enlarged lymph nodes,   Past Medical History  Diagnosis Date  . Asthma     . Hypertension   . CHF (congestive heart failure)   . Myasthenia gravis   . Heart murmur   . Venous stasis    Past Surgical History  Procedure Date  . Tonsillectomy   . Myomectomy    Social History:  reports that she has never smoked. She does not have any smokeless tobacco history on file. She reports that she does not drink alcohol or use illicit drugs. where does patient live-at-home by herself   Allergies  Allergen Reactions  . Asa (Aspirin) Shortness Of Breath and Rash  . Dilaudid (Hydromorphone Hcl) Shortness Of Breath and Palpitations  . Erythromycin Other (See Comments)    Burn veins  . Indomethacin Other (See Comments)    Stroke symptoms  . Naproxen Other (See Comments)    Renal failure  . Alginate-Collagen (Wound Dressings) Rash  . Gold Salts Rash  . Silvadene (Silver Sulfadiazine) Rash  . Tylenol (Acetaminophen) Rash and Other (See Comments)    Salivation and hearing loss    History reviewed. No pertinent family history. she denies history of diabetes  Prior to Admission medications   Medication Sig Start Date End Date Taking? Authorizing Provider  albuterol (PROVENTIL HFA;VENTOLIN HFA) 108 (90 BASE) MCG/ACT inhaler Inhale 4 puffs into the lungs every 6 (six) hours as needed. For shortness of breath   Yes Historical Provider, MD  COD LIVER OIL PO Take 2 capsules by mouth daily.   Yes Historical Provider, MD  diphenhydrAMINE (BENADRYL) 25 MG tablet Take 50 mg by mouth 4 (four) times daily.   Yes Historical Provider, MD  Emollient (NIVEA) cream Apply 1 application topically daily. Apply all over body  Yes Historical Provider, MD  EPINEPHrine (EPIPEN) 0.3 mg/0.3 mL DEVI Inject 0.3 mg into the muscle as needed. For allergic reaction   Yes Historical Provider, MD  ferrous sulfate 325 (65 FE) MG tablet Take 325-650 mg by mouth daily with breakfast.   Yes Historical Provider, MD  Garlic Oil 1000 MG CAPS Take 1,000 mg by mouth daily.   Yes Historical Provider, MD   ibuprofen (ADVIL,MOTRIN) 200 MG tablet Take 2,000 mg by mouth 4 (four) times daily. Takes 10 tablets to get 2000mg    Yes Historical Provider, MD  Multiple Vitamin (MULTIVITAMIN WITH MINERALS) TABS Take 1 tablet by mouth daily. 1 gummy   Yes Historical Provider, MD  Omega-3 Fatty Acids (FISH OIL) 500 MG CAPS Take 1,000 mg by mouth daily.   Yes Historical Provider, MD  triamterene-hydrochlorothiazide (MAXZIDE) 75-50 MG per tablet Take 1 tablet by mouth daily.   Yes Historical Provider, MD  vitamin E 100 UNIT capsule Take 100 Units by mouth daily.   Yes Historical Provider, MD  pyridostigmine (MESTINON) 60 MG tablet Take 60 mg by mouth 4 (four) times daily.    Historical Provider, MD   Physical Exam: Filed Vitals:   11/11/11 1501 11/11/11 1515 11/11/11 1545 11/11/11 1615  BP: 134/84 110/70 130/85 125/86  Pulse: 112 91 98 101  Temp:      TempSrc:      Resp: 22     SpO2: 94% 96% 97% 96%   Constitutional: Vital signs reviewed.  Patient is a well-developed and obese in no acute distress and cooperative with exam. Alert and oriented x3.  Head: Normocephalic and atraumatic Mouth: no erythema or exudates, MMM Eyes: PERRL, EOMI, conjunctivae normal, No scleral icterus.  Neck: Supple, Trachea midline normal ROM, No JVD, mass, thyromegaly, or carotid bruit present.  Cardiovascular: RRR, S1 normal, S2 normal, no MRG, pulses symmetric and intact bilaterally Pulmonary/Chest: CTAB, decreased breath sounds at bases,no wheezes, rales, or rhonchi Abdominal: Soft. Non-tender, non-distended, bowel sounds are normal, no masses, organomegaly, or guarding present.  Extremities: Right leg with +1-2 edema , large ulceration with patches of puslike exudate from her mid leg to just above the ankle.left leg was even largerarea of ulceration and puslike patches exudate, circumferential in the lower part the leg Neurological: A&O x3, Strength is normal and symmetric bilaterally, cranial nerve II-XII are grossly intact,  no focal motor deficit, sensory intact to light touch bilaterally.  no cyanosis Skin: Warm, dry and intact. No rash Psychiatric: Normal mood and affect.   Labs on Admission:  Basic Metabolic Panel:  Lab 11/11/11 4098  NA 139  K 3.0*  CL 100  CO2 22  GLUCOSE 95  BUN 31*  CREATININE 1.67*  CALCIUM 9.8  MG --  PHOS --   Liver Function Tests: No results found for this basename: AST:5,ALT:5,ALKPHOS:5,BILITOT:5,PROT:5,ALBUMIN:5 in the last 168 hours No results found for this basename: LIPASE:5,AMYLASE:5 in the last 168 hours No results found for this basename: AMMONIA:5 in the last 168 hours CBC:  Lab 11/11/11 1342  WBC 8.1  NEUTROABS 6.0  HGB 10.9*  HCT 33.6*  MCV 71.5*  PLT 462*   Cardiac Enzymes: No results found for this basename: CKTOTAL:5,CKMB:5,CKMBINDEX:5,TROPONINI:5 in the last 168 hours  BNP (last 3 results) No results found for this basename: PROBNP:3 in the last 8760 hours CBG: No results found for this basename: GLUCAP:5 in the last 168 hours  Radiological Exams on Admission: No results found.    Assessment/Plan Principal Problem:  *Ulcers of both lower  legs/ Cellulitis -As discussed above, will obtain wound cultures -Empiric antibiotics with vancomycin -wound care consult for further recommendations - pain management Active Problems: ARF (acute renal failure) -Hold off diuretics, and DC ibuprofen, follow and recheck  Hypokalemia -Secondary to diuretics, replace potassium  Hypertension -Start labetalol, follow  h/o Venous stasis History of CHF -Appears compensated, monitor fluid status esp with holding Maxzide secondary to ARF, follow and further treat accordingly History of myasthenia gravis -Continue outpatient medications Code Status: FULL Family Communication: Directly with patient at bedside Disposition Plan: Admit to medical floor, to home once medically ready  Time spent: >30MINS  Kela Millin [facility] Pager  [phone]  If 7PM-7AM, please contact night-coverage [email or web address] Password [email or web address] 11/11/2011, 5:15 PM

## 2011-11-11 NOTE — Progress Notes (Signed)
ANTIBIOTIC CONSULT NOTE - INITIAL  Pharmacy Consult for vancomycin Indication: cellulitis  Allergies  Allergen Reactions  . Asa (Aspirin) Shortness Of Breath and Rash  . Dilaudid (Hydromorphone Hcl) Shortness Of Breath and Palpitations  . Erythromycin Other (See Comments)    Burn veins  . Indomethacin Other (See Comments)    Stroke symptoms  . Naproxen Other (See Comments)    Renal failure  . Alginate-Collagen (Wound Dressings) Rash  . Gold Salts Rash  . Silvadene (Silver Sulfadiazine) Rash  . Tylenol (Acetaminophen) Rash and Other (See Comments)    Salivation and hearing loss    Patient Measurements: Height: 5' 6.5" (168.9 cm) Weight: 218 lb 0.6 oz (98.9 kg) IBW/kg (Calculated) : 60.45  Adjusted Body Weight:   Vital Signs: Temp: 97.8 F (36.6 C) (10/17 1233) Temp src: Oral (10/17 1233) BP: 125/86 mmHg (10/17 1615) Pulse Rate: 101  (10/17 1615) Intake/Output from previous day:   Intake/Output from this shift:    Labs:  Basename 11/11/11 1342  WBC 8.1  HGB 10.9*  PLT 462*  LABCREA --  CREATININE 1.67*   Estimated Creatinine Clearance: 47.2 ml/min (by C-G formula based on Cr of 1.67). No results found for this basename: VANCOTROUGH:2,VANCOPEAK:2,VANCORANDOM:2,GENTTROUGH:2,GENTPEAK:2,GENTRANDOM:2,TOBRATROUGH:2,TOBRAPEAK:2,TOBRARND:2,AMIKACINPEAK:2,AMIKACINTROU:2,AMIKACIN:2, in the last 72 hours   Microbiology: No results found for this or any previous visit (from the past 720 hour(s)).  Medical History: Past Medical History  Diagnosis Date  . Asthma   . Hypertension   . CHF (congestive heart failure)   . Myasthenia gravis   . Heart murmur   . Venous stasis     Medications:  Scheduled:    . enoxaparin (LOVENOX) injection  40 mg Subcutaneous Q24H  . ferrous sulfate  325-650 mg Oral Q breakfast  . labetalol  100 mg Oral BID  . multivitamin with minerals  1 tablet Oral Daily  . nivea  1 application Topical Daily  . oxyCODONE  10 mg Oral Once  .  potassium chloride  40 mEq Oral Q4H  . pyridostigmine  60 mg Oral QID  . senna  1 tablet Oral QHS  . sodium chloride  3 mL Intravenous Q12H  . vancomycin  1,000 mg Intravenous Once   Infusions:   Assessment: 51 yo female with cellulitis will be continued on vancomycin therapy.  Patient received 1g Vancomycin at 1357 today.  CrCl ~47.2 (SCr has jumped up to 1.67 as compared to 10/23/11 of 0.88) and weighs 98.9 kg  Goal of Therapy:  Vancomycin trough level 10-15 mcg/ml  Plan:  1) Start vancomycin 1500mg  iv q24h, 1st dose at 1000 on 11/12/11 2) Monitor cx and sensitivity if available.  Monitor renal fxn closely. 3) Check vancomycin trough when it's appropriate.  Lorely Bubb, Tsz-Yin 11/11/2011,5:42 PM

## 2011-11-11 NOTE — ED Notes (Addendum)
Needs  Refill on medications. Pt states that she is having profuse drainage from sores on legs this has been a prolonged problem over several years.  Legs are sore, tender, and swollen.

## 2011-11-11 NOTE — ED Provider Notes (Signed)
Chief Complaint  Patient presents with  . Medication Refill    History of Present Illness:   The patient is a 51 year old female who presents today for refills on medications. Her biggest problem has been bilateral leg ulcers. The ulcers are huge and deep. They're very painful and draining malodorous drainage. She denies any fever or chills. She has a history of DVTs in the past. They're little bit more swollen than usual and she wonders about the possibility of another DVT. She's been treating these herself at home with daily dressing changes. She's been to [facility] and the wound care center here, but nothing seems to help. She also has high blood pressure and is on triamterene/HCTZ 75/50 which she is tolerating well. She denies any headaches, dizziness, chest pain, or shortness of breath. She also has myasthenia gravis which is controlled with pyridostigmine 60 mg every 6 hours. She also has a history of rheumatoid arthritis.  Review of Systems:  Other than noted above, the patient denies any of the following symptoms. Systemic:  No fever, chills, sweats, fatigue, myalgias, headache, or anorexia. Eye:  No redness, pain or drainage. ENT:  No earache, nasal congestion, rhinorrhea, sinus pressure, or sore throat. Lungs:  No cough, sputum production, wheezing, shortness of breath.  Cardiovascular:  No chest pain, palpitations, or syncope. GI:  No nausea, vomiting, abdominal pain or diarrhea. GU:  No dysuria, frequency, or hematuria. Skin:  No rash or pruritis.  PMFSH:  Past medical history, family history, social history, meds, and allergies were reviewed.   Physical Exam:   Vital signs:  BP 122/78  Pulse 102  Temp 98.7 F (37.1 C) (Oral)  Resp 20  SpO2 100%  LMP 07/12/2011 General:  Alert, in no distress. Eye:  PERRL, full EOMs.  Lids and conjunctivas were normal. ENT:  TMs and canals were normal, without erythema or inflammation.  Nasal mucosa was clear and uncongested, without  drainage.  Mucous membranes were moist.  Pharynx was clear, without exudate or drainage.  There were no oral ulcerations or lesions. Neck:  Supple, no adenopathy, tenderness or mass. Thyroid was normal. Lungs:  No respiratory distress.  Lungs were clear to auscultation, without wheezes, rales or rhonchi.  Breath sounds were clear and equal bilaterally. Heart:  Regular rhythm, without gallops, murmers or rubs. Abdomen:  Soft, flat, and non-tender to palpation.  No hepatosplenomagaly or mass. Extremities: She has huge, deep, circumferential leg ulcers on both legs, the left more so than the right. These are draining purulent drainage and there is surrounding swelling and tenderness to touch. Skin:  Clear, warm, and dry, without rash or lesions.    Ulcer on left leg.   Ulcers on right leg.  Assessment:  The primary encounter diagnosis was Ulcers of both lower legs. Diagnoses of Hypertension and Myasthenia gravis were also pertinent to this visit.  Plan:   1.  The following meds were prescribed:   New Prescriptions   No medications on file   2.  The patient was transferred to the emergency department via shuttle.  Reuben Likes, MD 11/11/11 [phone]

## 2011-11-12 DIAGNOSIS — L0390 Cellulitis, unspecified: Secondary | ICD-10-CM | POA: Diagnosis not present

## 2011-11-12 DIAGNOSIS — G7000 Myasthenia gravis without (acute) exacerbation: Secondary | ICD-10-CM | POA: Diagnosis not present

## 2011-11-12 DIAGNOSIS — M7989 Other specified soft tissue disorders: Secondary | ICD-10-CM | POA: Diagnosis not present

## 2011-11-12 DIAGNOSIS — N179 Acute kidney failure, unspecified: Secondary | ICD-10-CM | POA: Diagnosis not present

## 2011-11-12 DIAGNOSIS — E876 Hypokalemia: Secondary | ICD-10-CM | POA: Diagnosis not present

## 2011-11-12 DIAGNOSIS — M79609 Pain in unspecified limb: Secondary | ICD-10-CM | POA: Diagnosis not present

## 2011-11-12 DIAGNOSIS — L0291 Cutaneous abscess, unspecified: Secondary | ICD-10-CM | POA: Diagnosis not present

## 2011-11-12 LAB — MAGNESIUM: Magnesium: 2.4 mg/dL (ref 1.5–2.5)

## 2011-11-12 LAB — BASIC METABOLIC PANEL
BUN: 26 mg/dL — ABNORMAL HIGH (ref 6–23)
GFR calc Af Amer: 47 mL/min — ABNORMAL LOW (ref >90)
GFR calc non Af Amer: 41 mL/min — ABNORMAL LOW (ref >90)
Potassium: 2.4 mEq/L — CL (ref 3.5–5.1)
Sodium: 136 mEq/L (ref 135–145)

## 2011-11-12 LAB — CBC
Hemoglobin: 9.5 g/dL — ABNORMAL LOW (ref 12.0–15.0)
MCHC: 32.3 g/dL (ref 30.0–36.0)

## 2011-11-12 MED ORDER — HYDROCERIN EX CREA
TOPICAL_CREAM | Freq: Every day | CUTANEOUS | Status: DC
  Administered 2011-11-12 – 2011-11-16 (×5): via TOPICAL
  Filled 2011-11-12: qty 113

## 2011-11-12 MED ORDER — OXYCODONE HCL 5 MG PO TABS
5.0000 mg | ORAL_TABLET | ORAL | Status: DC | PRN
  Administered 2011-11-12 – 2011-11-14 (×11): 10 mg via ORAL
  Administered 2011-11-14 (×2): 5 mg via ORAL
  Administered 2011-11-15 – 2011-11-16 (×8): 10 mg via ORAL
  Filled 2011-11-12 (×21): qty 2

## 2011-11-12 MED ORDER — POTASSIUM CHLORIDE CRYS ER 20 MEQ PO TBCR
40.0000 meq | EXTENDED_RELEASE_TABLET | Freq: Three times a day (TID) | ORAL | Status: AC
  Administered 2011-11-12 (×3): 40 meq via ORAL
  Filled 2011-11-12 (×3): qty 2

## 2011-11-12 NOTE — Progress Notes (Signed)
Clinical Social Work  CSW received inappropriate referral for assistance with medication. CSW informed CM of referral. CSW is signing off but available if needed.  [place], [place] [phone]

## 2011-11-12 NOTE — Consult Note (Signed)
WOC consult Note Reason for Consult: LE wounds, reviewed images from yesterday. This pt has extensive history with [facility] and [facility], has had multiple skin grafts (biological), multiple tx regimens. Per patient she can not tolerate compression, allergic to Ag+, can not tolerate alginates, can not tolerate collagens(which we do not have inpatient).   Has used hydrocolloid dressings in the past and due to the current level of drainage this would not work for her wounds currently, which is is in agreement on that. Wound type: Chronic severe venous stasis disease, with multiple failed grafts Measurement: wounds to the bil. LE, that extends circumfrencially the entire bil. LE malleolar region.  Wound bed: pale, painful, yellow fibrin and slough, does have some epithelial islands present throughout the wounds Drainage (amount, consistency, odor) moderate to heavy, with slight odor per patient Periwound:some scattered lesions outside of the main ulcers Dressing procedure/placement/frequency: will continue non adherent dressing and gauze, as pt can not tolerate any other topicals or compression.  Keep legs elevated at much as possible.   I believe pt can best be served at this time by plastic surgery, they are working with [facility] now and having some success, not sure if pt is candidate currently but would suggest having Dr. Kelly Splinter or her PA Shawn Rayborn evaluate this pt.  I have discussed with bedside nursing and with the pt and she is in agreement.  Re consult if needed, will not follow at this time. Thanks  Haly Feher [employer], CWOCN [phone])

## 2011-11-12 NOTE — Progress Notes (Signed)
VASCULAR LAB PRELIMINARY  PRELIMINARY  PRELIMINARY  PRELIMINARY  Bilateral lower extremity venous duplex completed.    Preliminary report:  No obvious evidence of DVT, superficial thrombus, or Baker's cyst bilaterally. Technically difficult and limited due to edema and ulcerations  [name], RVS 11/12/2011, 11:59 AM

## 2011-11-12 NOTE — Progress Notes (Addendum)
[facility] PROGRESS NOTE  [name] [id number]:[id number] DOB: [birth year] DOA: 11/11/2011 PCP: Sheila Oats, MD  Assessment/Plan:  Principal Problem:  *Ulcers of both lower legs/ Cellulitis  -Gram stain with no organisms, follow wound cultures  -Continue vancomycin  -Plastic surgery consulted as per wound care recommendations - continue pain management  Active Problems:  ARF (acute renal failure)  -Improving off diuretics, and ibuprofen, follow and recheck  Hypokalemia  -Secondary to diuretics, again replace potassium  -Check magnesium level in a.m. Hypertension  -Patient states she cannot take beta blockers because of her myasthenia  -low this am, Monitor off diuretics/meds, and treat as appropriate h/o Venous stasis  History of CHF  -Appears compensated, monitor fluid status esp with holding Maxzide secondary to ARF, follow and further treat accordingly  History of myasthenia gravis  -Continue outpatient medications  Code Status: full Family Communication directly with patient at bedside Disposition Plan: To home once medically ready   Consultants:  Plastic surgery-Dr. Kelly Splinter, awaiting evaluation  Procedures:  None  Antibiotics:  Vanc  HPI/Subjective: States decrease in leg swelling, denies any new complaints.  Objective: Filed Vitals:   11/11/11 1700 11/11/11 2129 11/12/11 0500 11/12/11 1447  BP: 127/67 121/73 110/63 84/59  Pulse: 102 100 87 84  Temp: 98 F (36.7 C) 98.4 F (36.9 C) 98.9 F (37.2 C) 98.2 F (36.8 C)  TempSrc: Oral Oral Oral Oral  Resp: 20 20 20 20   Height: 5' 6.5" (1.689 m)     Weight: 98.9 kg (218 lb 0.6 oz)     SpO2: 98% 98% 99% 98%    Intake/Output Summary (Last 24 hours) at 11/12/11 1641 Last data filed at 11/12/11 0949  Gross per 24 hour  Intake    300 ml  Output      0 ml  Net    300 ml   Filed Weights   11/11/11 1700  Weight: 98.9 kg (218 lb 0.6 oz)    Exam:   General:  Alert and oriented x3, in no  apparent distress  Cardiovascular: RRR, normal S1-S2  Respiratory: lungs clear to auscultation, no crackles, no wheezes  Abdomen: Soft, bowel sounds present nontender non distended  Extremities: Bilateral lower legs with dressing clean and dry  Data Reviewed: Basic Metabolic Panel:  Lab 11/12/11 7564 11/11/11 1744 11/11/11 1342  NA 136 -- 139  K 2.4* -- 3.0*  CL 100 -- 100  CO2 25 -- 22  GLUCOSE 107* -- 95  BUN 26* -- 31*  CREATININE 1.44* 1.65* 1.67*  CALCIUM 9.1 -- 9.8  MG 2.4 -- --  PHOS -- -- --   Liver Function Tests: No results found for this basename: AST:5,ALT:5,ALKPHOS:5,BILITOT:5,PROT:5,ALBUMIN:5 in the last 168 hours No results found for this basename: LIPASE:5,AMYLASE:5 in the last 168 hours No results found for this basename: AMMONIA:5 in the last 168 hours CBC:  Lab 11/12/11 0547 11/11/11 1744 11/11/11 1342  WBC 6.9 6.8 8.1  NEUTROABS -- -- 6.0  HGB 9.5* 9.9* 10.9*  HCT 29.4* 31.0* 33.6*  MCV 71.7* 70.8* 71.5*  PLT 395 398 462*   Cardiac Enzymes: No results found for this basename: CKTOTAL:5,CKMB:5,CKMBINDEX:5,TROPONINI:5 in the last 168 hours BNP (last 3 results) No results found for this basename: PROBNP:3 in the last 8760 hours CBG: No results found for this basename: GLUCAP:5 in the last 168 hours  Recent Results (from the past 240 hour(s))  WOUND CULTURE     Status: Normal (Preliminary result)   Collection Time   11/12/11 12:43 AM  Component Value Range Status Comment   Specimen Description LEG RIGHT   Final    Special Requests NONE   Final    Gram Stain     Final    Value: NO WBC SEEN     NO SQUAMOUS EPITHELIAL CELLS SEEN     NO ORGANISMS SEEN   Culture PENDING   Incomplete    Report Status PENDING   Incomplete   WOUND CULTURE     Status: Normal (Preliminary result)   Collection Time   11/12/11 12:43 AM      Component Value Range Status Comment   Specimen Description WOUND LEFT LEG   Final    Special Requests NONE   Final     Gram Stain     Final    Value: NO WBC SEEN     NO SQUAMOUS EPITHELIAL CELLS SEEN     NO ORGANISMS SEEN   Culture PENDING   Incomplete    Report Status PENDING   Incomplete      Studies: No results found.  Scheduled Meds:   . enoxaparin (LOVENOX) injection  40 mg Subcutaneous Q24H  . ferrous sulfate  325-650 mg Oral Q breakfast  . hydrocerin   Topical Daily  . labetalol  100 mg Oral BID  . multivitamin with minerals  1 tablet Oral Daily  . potassium chloride  40 mEq Oral Q4H  . potassium chloride  40 mEq Oral TID WC  . pyridostigmine  60 mg Oral QID  . senna  1 tablet Oral QHS  . sodium chloride  3 mL Intravenous Q12H  . vancomycin  1,500 mg Intravenous Q24H  . DISCONTD: nivea  1 application Topical Daily   Continuous Infusions:   Principal Problem:  *Ulcers of both lower legs Active Problems:  Cellulitis  ARF (acute renal failure)  Hypokalemia  Hypertension  Venous stasis    Time spent:    Rosela Supak C  [facility] Pager [phone]. If 8PM-8AM, please contact night-coverage at [email or web address], password [facility] 11/12/2011, 4:41 PM  LOS: 1 day

## 2011-11-12 NOTE — Care Management Note (Signed)
    Page 1 of 2   11/16/2011     5:56:12 PM   CARE MANAGEMENT NOTE 11/16/2011  Patient:  [name],[name]   Account Number:  [id number]  Date Initiated:  11/12/2011  Documentation initiated by:  Letha Cape  Subjective/Objective Assessment:   dx ulcers of lle,  admit- lives alone, pta independent.     Action/Plan:   Anticipated DC Date:  11/16/2011   Anticipated DC Plan:  HOME W HOME HEALTH SERVICES      DC Planning Services  CM consult  Follow-up appt scheduled      [facility] Choice  HOME HEALTH   Choice offered to / List presented to:  C-1 Patient        HH arranged  HH-1 RN      [facility] agency  [facility]   Status of service:  Completed, signed off Medicare Important Message given?   (If response is "NO", the following Medicare IM given date fields will be blank) Date Medicare IM given:   Date Additional Medicare IM given:    Discharge Disposition:  HOME W HOME HEALTH SERVICES  Per UR Regulation:  Reviewed for med. necessity/level of care/duration of stay  If discussed at Long Length of Stay Meetings, dates discussed:    Comments:  11/16/11 17:55 Letha Cape RN, BSN [phone] pt dc to home with hh services.  11/12/11 14:12 Letha Cape RN, BSN [phone] patient lives alone, pta independent.  Patient has medication coverage and transportation via scat.  Patient states she would like to work with Genevieve Norlander for [facility] for wound care at discharge.   Referral made ot [facility] for [facility] for wound care, [facility] notified.  Soc will begin 24-48 hrs post discharge.  Scheduled patient to see Dr. Johnn Hai on Oct 24.  Informed patient that since she has insurance to cover her meds we could not ast her with meds. NCM will continue to follow for dc needs.

## 2011-11-13 DIAGNOSIS — L0291 Cutaneous abscess, unspecified: Secondary | ICD-10-CM | POA: Diagnosis not present

## 2011-11-13 DIAGNOSIS — L0390 Cellulitis, unspecified: Secondary | ICD-10-CM | POA: Diagnosis not present

## 2011-11-13 DIAGNOSIS — L97909 Non-pressure chronic ulcer of unspecified part of unspecified lower leg with unspecified severity: Secondary | ICD-10-CM | POA: Diagnosis not present

## 2011-11-13 DIAGNOSIS — N179 Acute kidney failure, unspecified: Secondary | ICD-10-CM | POA: Diagnosis not present

## 2011-11-13 DIAGNOSIS — E876 Hypokalemia: Secondary | ICD-10-CM | POA: Diagnosis not present

## 2011-11-13 LAB — BASIC METABOLIC PANEL
CO2: 27 mEq/L (ref 19–32)
Calcium: 9 mg/dL (ref 8.4–10.5)
GFR calc non Af Amer: 53 mL/min — ABNORMAL LOW (ref >90)
Sodium: 137 mEq/L (ref 135–145)

## 2011-11-13 LAB — MAGNESIUM: Magnesium: 2.4 mg/dL (ref 1.5–2.5)

## 2011-11-13 MED ORDER — POTASSIUM CHLORIDE CRYS ER 20 MEQ PO TBCR
40.0000 meq | EXTENDED_RELEASE_TABLET | ORAL | Status: AC
  Administered 2011-11-13 (×2): 40 meq via ORAL
  Filled 2011-11-13 (×2): qty 2

## 2011-11-13 NOTE — Progress Notes (Signed)
[facility] PROGRESS NOTE  [name] ZOX:[id number] DOB: Nov 03, 1960 DOA: 11/11/2011 PCP: Sheila Oats, MD  Assessment/Plan:  Principal Problem:  *Ulcers of both lower legs/ Cellulitis  -Gram stain with no organisms, follow wound cultures  -Continue vancomycin  -spoke with Dr Eloisa Northern surgery today re: consult called 10/18 and she states she will see pt this weekend consulted as per wound care recommendations - continue pain management  Active Problems:  ARF (acute renal failure)  -resolved off diuretics, and ibuprofen, follow and recheck  Hypokalemia  -Secondary to diuretics, will again replace potassium  -magnesium level wnl -2.4. Hypertension  -Patient states she cannot take beta blockers because of her myasthenia  -BP remains controlled off meds, continue monitoring off diuretics/meds, and treat as appropriate h/o Venous stasis  History of CHF  -Appears compensated, monitor fluid status esp with holding Maxzide secondary to ARF, follow and further treat accordingly  History of myasthenia gravis  -Continue outpatient medications  Code Status: full Family Communication directly with patient at bedside Disposition Plan: To home once medically ready   Consultants:  Plastic surgery-Dr. Kelly Splinter- states will see pt today or in am 10/20  Procedures:  None  Antibiotics:  Vanc  HPI/Subjective: States decrease in leg swelling, denies any new complaints.  Objective: Filed Vitals:   11/12/11 0500 11/12/11 1447 11/12/11 2200 11/13/11 0527  BP: 110/63 84/59 118/74 106/51  Pulse: 87 84 90 78  Temp: 98.9 F (37.2 C) 98.2 F (36.8 C) 98.4 F (36.9 C) 98.8 F (37.1 C)  TempSrc: Oral Oral  Oral  Resp: 20 20 22 20   Height:      Weight:      SpO2: 99% 98% 99% 98%    Intake/Output Summary (Last 24 hours) at 11/13/11 1231 Last data filed at 11/13/11 0600  Gross per 24 hour  Intake    840 ml  Output      0 ml  Net    840 ml   Filed Weights   11/11/11 1700  Weight: 98.9 kg (218 lb 0.6 oz)    Exam:   General:  Alert and oriented x3, in no apparent distress  Cardiovascular: RRR, normal S1-S2  Respiratory: lungs clear to auscultation, no crackles, no wheezes  Abdomen: Soft, bowel sounds present nontender non distended  Extremities: Bilateral lower legs with dressing clean and dry  Data Reviewed: Basic Metabolic Panel:  Lab 11/13/11 8119 11/12/11 0547 11/11/11 1744 11/11/11 1342  NA 137 136 -- 139  K 3.3* 2.4* -- 3.0*  CL 103 100 -- 100  CO2 27 25 -- 22  GLUCOSE 109* 107* -- 95  BUN 20 26* -- 31*  CREATININE 1.16* 1.44* 1.65* 1.67*  CALCIUM 9.0 9.1 -- 9.8  MG 2.4 2.4 -- --  PHOS -- -- -- --   Liver Function Tests: No results found for this basename: AST:5,ALT:5,ALKPHOS:5,BILITOT:5,PROT:5,ALBUMIN:5 in the last 168 hours No results found for this basename: LIPASE:5,AMYLASE:5 in the last 168 hours No results found for this basename: AMMONIA:5 in the last 168 hours CBC:  Lab 11/12/11 0547 11/11/11 1744 11/11/11 1342  WBC 6.9 6.8 8.1  NEUTROABS -- -- 6.0  HGB 9.5* 9.9* 10.9*  HCT 29.4* 31.0* 33.6*  MCV 71.7* 70.8* 71.5*  PLT 395 398 462*   Cardiac Enzymes: No results found for this basename: CKTOTAL:5,CKMB:5,CKMBINDEX:5,TROPONINI:5 in the last 168 hours BNP (last 3 results) No results found for this basename: PROBNP:3 in the last 8760 hours CBG: No results found for this basename: GLUCAP:5 in the last 168  hours  Recent Results (from the past 240 hour(s))  WOUND CULTURE     Status: Normal (Preliminary result)   Collection Time   11/12/11 12:43 AM      Component Value Range Status Comment   Specimen Description LEG RIGHT   Final    Special Requests NONE   Final    Gram Stain     Final    Value: NO WBC SEEN     NO SQUAMOUS EPITHELIAL CELLS SEEN     NO ORGANISMS SEEN   Culture Culture reincubated for better growth   Final    Report Status PENDING   Incomplete   WOUND CULTURE     Status: Normal  (Preliminary result)   Collection Time   11/12/11 12:43 AM      Component Value Range Status Comment   Specimen Description WOUND LEFT LEG   Final    Special Requests NONE   Final    Gram Stain     Final    Value: NO WBC SEEN     NO SQUAMOUS EPITHELIAL CELLS SEEN     NO ORGANISMS SEEN   Culture Culture reincubated for better growth   Final    Report Status PENDING   Incomplete      Studies: No results found.  Scheduled Meds:    . enoxaparin (LOVENOX) injection  40 mg Subcutaneous Q24H  . ferrous sulfate  325-650 mg Oral Q breakfast  . hydrocerin   Topical Daily  . multivitamin with minerals  1 tablet Oral Daily  . potassium chloride  40 mEq Oral TID WC  . potassium chloride  40 mEq Oral Q4H  . pyridostigmine  60 mg Oral QID  . senna  1 tablet Oral QHS  . sodium chloride  3 mL Intravenous Q12H  . vancomycin  1,500 mg Intravenous Q24H  . DISCONTD: labetalol  100 mg Oral BID   Continuous Infusions:   Principal Problem:  *Ulcers of both lower legs Active Problems:  Cellulitis  ARF (acute renal failure)  Hypokalemia  Hypertension  Venous stasis    Time spent:49min    Melvin Marmo C  [facility] Pager [phone]. If 8PM-8AM, please contact night-coverage at [email or web address], password [facility] 11/13/2011, 12:31 PM  LOS: 2 days

## 2011-11-14 ENCOUNTER — Encounter (HOSPITAL_COMMUNITY): Payer: Self-pay | Admitting: Plastic Surgery

## 2011-11-14 DIAGNOSIS — N179 Acute kidney failure, unspecified: Secondary | ICD-10-CM | POA: Diagnosis not present

## 2011-11-14 DIAGNOSIS — I10 Essential (primary) hypertension: Secondary | ICD-10-CM | POA: Diagnosis not present

## 2011-11-14 DIAGNOSIS — L97909 Non-pressure chronic ulcer of unspecified part of unspecified lower leg with unspecified severity: Secondary | ICD-10-CM | POA: Diagnosis not present

## 2011-11-14 DIAGNOSIS — L0291 Cutaneous abscess, unspecified: Secondary | ICD-10-CM | POA: Diagnosis not present

## 2011-11-14 LAB — BASIC METABOLIC PANEL
BUN: 16 mg/dL (ref 6–23)
CO2: 27 mEq/L (ref 19–32)
Chloride: 104 mEq/L (ref 96–112)
Creatinine, Ser: 1.12 mg/dL — ABNORMAL HIGH (ref 0.50–1.10)
GFR calc Af Amer: 64 mL/min — ABNORMAL LOW (ref >90)
Potassium: 3.9 mEq/L (ref 3.5–5.1)

## 2011-11-14 LAB — WOUND CULTURE

## 2011-11-14 MED ORDER — VANCOMYCIN HCL IN DEXTROSE 1-5 GM/200ML-% IV SOLN
1000.0000 mg | Freq: Two times a day (BID) | INTRAVENOUS | Status: DC
  Administered 2011-11-14 – 2011-11-15 (×3): 1000 mg via INTRAVENOUS
  Filled 2011-11-14 (×4): qty 200

## 2011-11-14 NOTE — Progress Notes (Signed)
[facility] PROGRESS NOTE  [name] [id number] DOB: [birth year] DOA: 11/11/2011 PCP: Sheila Oats, MD  Assessment/Plan:  Principal Problem:  *Ulcers of both lower legs/ Cellulitis  -Gram stain with no organisms, wound culture so far with multiple organisms but so far no staph aureus isolated. -Continue vancomycin  -spoke with Dr Eloisa Northern surgery today re: consult called 10/18 - eval pending for this weekend. - continue pain management  Active Problems:  ARF (acute renal failure)  -resolved off diuretics, and ibuprofen. Hypokalemia  -Secondary to diuretics, resolved -magnesium level wnl -2.4. Hypertension  -Patient states she cannot take beta blockers because of her myasthenia  -BP remains controlled off meds, continue monitoring off diuretics/meds, and treat as appropriate h/o Venous stasis  History of CHF  -Appears compensated, monitor fluid status esp with holding Maxzide secondary to ARF, follow and further treat accordingly  History of myasthenia gravis  -Continue outpatient medications  Code Status: full Family Communication directly with patient at bedside Disposition Plan: To home once medically ready   Consultants:  Plastic surgery-Dr. Kelly Splinter to see pt today 10/20  Procedures:  None  Antibiotics:  Vanc  HPI/Subjective:  denies any new complaints, anxiously awaiting plastics eval.nurse doing dressing change  Objective: Filed Vitals:   11/13/11 0527 11/13/11 1445 11/13/11 2141 11/14/11 0522  BP: 106/51 112/76 116/62 97/62  Pulse: 78 81 80 70  Temp: 98.8 F (37.1 C) 98.5 F (36.9 C) 98 F (36.7 C) 98.3 F (36.8 C)  TempSrc: Oral Oral Oral Oral  Resp: 20 18 18 18   Height:      Weight:      SpO2: 98% 96% 96% 98%    Intake/Output Summary (Last 24 hours) at 11/14/11 1409 Last data filed at 11/13/11 1446  Gross per 24 hour  Intake    320 ml  Output      0 ml  Net    320 ml   Filed Weights   11/11/11 1700  Weight: 98.9  kg (218 lb 0.6 oz)    Exam:   General:  Alert and oriented x3, in no apparent distress  Cardiovascular: RRR, normal S1-S2  Respiratory: lungs clear to auscultation, no crackles, no wheezes  Abdomen: Soft, bowel sounds present nontender non distended  Extremities: Bilateral lower legs with with large ulcers-L>R, mostly clean base with small areas of exudate.Edema and erythema much decreased..  Data Reviewed: Basic Metabolic Panel:  Lab 11/14/11 7829 11/13/11 0545 11/12/11 0547 11/11/11 1744 11/11/11 1342  NA 137 137 136 -- 139  K 3.9 3.3* 2.4* -- 3.0*  CL 104 103 100 -- 100  CO2 27 27 25  -- 22  GLUCOSE 100* 109* 107* -- 95  BUN 16 20 26* -- 31*  CREATININE 1.12* 1.16* 1.44* 1.65* 1.67*  CALCIUM 9.0 9.0 9.1 -- 9.8  MG -- 2.4 2.4 -- --  PHOS -- -- -- -- --   Liver Function Tests: No results found for this basename: AST:5,ALT:5,ALKPHOS:5,BILITOT:5,PROT:5,ALBUMIN:5 in the last 168 hours No results found for this basename: LIPASE:5,AMYLASE:5 in the last 168 hours No results found for this basename: AMMONIA:5 in the last 168 hours CBC:  Lab 11/12/11 0547 11/11/11 1744 11/11/11 1342  WBC 6.9 6.8 8.1  NEUTROABS -- -- 6.0  HGB 9.5* 9.9* 10.9*  HCT 29.4* 31.0* 33.6*  MCV 71.7* 70.8* 71.5*  PLT 395 398 462*   Cardiac Enzymes: No results found for this basename: CKTOTAL:5,CKMB:5,CKMBINDEX:5,TROPONINI:5 in the last 168 hours BNP (last 3 results) No results found for this basename: PROBNP:3 in  the last 8760 hours CBG: No results found for this basename: GLUCAP:5 in the last 168 hours  Recent Results (from the past 240 hour(s))  WOUND CULTURE     Status: Normal (Preliminary result)   Collection Time   11/12/11 12:43 AM      Component Value Range Status Comment   Specimen Description LEG RIGHT   Final    Special Requests NONE   Final    Gram Stain     Final    Value: NO WBC SEEN     NO SQUAMOUS EPITHELIAL CELLS SEEN     NO ORGANISMS SEEN   Culture FEW PSEUDOMONAS  AERUGINOSA   Final    Report Status PENDING   Incomplete   WOUND CULTURE     Status: Normal   Collection Time   11/12/11 12:43 AM      Component Value Range Status Comment   Specimen Description WOUND LEFT LEG   Final    Special Requests NONE   Final    Gram Stain     Final    Value: NO WBC SEEN     NO SQUAMOUS EPITHELIAL CELLS SEEN     NO ORGANISMS SEEN   Culture     Final    Value: MULTIPLE ORGANISMS PRESENT, NONE PREDOMINANT     Note: NO STAPHYLOCOCCUS AUREUS ISOLATED NO GROUP A STREP (S.PYOGENES) ISOLATED   Report Status 11/14/2011 FINAL   Final      Studies: No results found.  Scheduled Meds:    . enoxaparin (LOVENOX) injection  40 mg Subcutaneous Q24H  . ferrous sulfate  325-650 mg Oral Q breakfast  . hydrocerin   Topical Daily  . multivitamin with minerals  1 tablet Oral Daily  . potassium chloride  40 mEq Oral Q4H  . pyridostigmine  60 mg Oral QID  . senna  1 tablet Oral QHS  . sodium chloride  3 mL Intravenous Q12H  . vancomycin  1,000 mg Intravenous Q12H  . DISCONTD: vancomycin  1,500 mg Intravenous Q24H   Continuous Infusions:   Principal Problem:  *Ulcers of both lower legs Active Problems:  Cellulitis  ARF (acute renal failure)  Hypokalemia  Hypertension  Venous stasis    Time spent:60min    Ivannah Zody C  [facility] Pager [phone]. If 8PM-8AM, please contact night-coverage at [email or web address], password [facility] 11/14/2011, 2:09 PM  LOS: 3 days

## 2011-11-14 NOTE — Consult Note (Signed)
Reason for Consult: Lower extremity ulcers Referring Physician: Hospitalist Service  [name] is an 51 y.o. female.  HPI: The patient is a 52 yrs old bf in the hospital for bilateral lower extremity cellulitis and renal evaluation.  She has a long standing history of bilateral venous stasis and ulcers, hypertension, asthma, myasthenia gravis, obesity, anemia, osteoarthritis, history of thalassemia major and  rheumatoid arthritis.  She was recently admitted for a myasthenia crisis attributed to missed doses of Mestinon.  She does not give a specific time line for the length she has been with the ulcers of her legs but more than a year.  She has tried a number of local care modalities without much change.  The ulcers are full thickness and involve the majority of the lower portion of both of her legs circumfrencially. There is a slight malodor and film noted with muscle exposed but no good granulation tissue noted. Distal pulses are weak and skin is very dry on her feet.  The patient states she had an evaluation over a year ago at [facility] for her vascular statis but does not think she has seen a vascular doctor here within the last year.   Past Medical History  Diagnosis Date  . Asthma   . Hypertension   . CHF (congestive heart failure)   . Myasthenia gravis   . Heart murmur   . Venous stasis     Past Surgical History  Procedure Date  . Tonsillectomy   . Myomectomy     History reviewed. No pertinent family history.  Social History:  reports that she has never smoked. She does not have any smokeless tobacco history on file. She reports that she does not drink alcohol or use illicit drugs.  Allergies:  Allergies  Allergen Reactions  . Asa (Aspirin) Shortness Of Breath and Rash  . Dilaudid (Hydromorphone Hcl) Shortness Of Breath and Palpitations  . Erythromycin Other (See Comments)    Burn veins  . Indomethacin Other (See Comments)    Stroke symptoms  . Naproxen Other (See  Comments)    Renal failure  . Alginate-Collagen (Wound Dressings) Rash  . Gold Salts Rash  . Silvadene (Silver Sulfadiazine) Rash  . Tylenol (Acetaminophen) Rash and Other (See Comments)    Salivation and hearing loss    Medications: I have reviewed the patient's current medications.  Results for orders placed during the hospital encounter of 11/11/11 (from the past 48 hour(s))  BASIC METABOLIC PANEL     Status: Abnormal   Collection Time   11/13/11  5:45 AM      Component Value Range Comment   Sodium 137  135 - 145 mEq/L    Potassium 3.3 (*) 3.5 - 5.1 mEq/L DELTA CHECK NOTED   Chloride 103  96 - 112 mEq/L    CO2 27  19 - 32 mEq/L    Glucose, Bld 109 (*) 70 - 99 mg/dL    BUN 20  6 - 23 mg/dL    Creatinine, Ser 4.09 (*) 0.50 - 1.10 mg/dL    Calcium 9.0  8.4 - 81.1 mg/dL    GFR calc non Af Amer 53 (*) >90 mL/min    GFR calc Af Amer 62 (*) >90 mL/min   MAGNESIUM     Status: Normal   Collection Time   11/13/11  5:45 AM      Component Value Range Comment   Magnesium 2.4  1.5 - 2.5 mg/dL   BASIC METABOLIC PANEL  Status: Abnormal   Collection Time   11/14/11  5:00 AM      Component Value Range Comment   Sodium 137  135 - 145 mEq/L    Potassium 3.9  3.5 - 5.1 mEq/L    Chloride 104  96 - 112 mEq/L    CO2 27  19 - 32 mEq/L    Glucose, Bld 100 (*) 70 - 99 mg/dL    BUN 16  6 - 23 mg/dL    Creatinine, Ser 1.19 (*) 0.50 - 1.10 mg/dL    Calcium 9.0  8.4 - 14.7 mg/dL    GFR calc non Af Amer 55 (*) >90 mL/min    GFR calc Af Amer 64 (*) >90 mL/min     No results found.  Review of Systems  Constitutional: Negative.   HENT: Negative.   Eyes: Negative.   Respiratory: Negative.   Cardiovascular: Positive for leg swelling.  Gastrointestinal: Negative.   Genitourinary: Negative.   Musculoskeletal: Negative.   Skin: Negative.   Neurological: Negative for sensory change and speech change.  Psychiatric/Behavioral: Positive for depression. Negative for suicidal ideas.   Blood  pressure 123/81, pulse 82, temperature 98.2 F (36.8 C), temperature source Oral, resp. rate 18, height 5' 6.5" (1.689 m), weight 98.9 kg (218 lb 0.6 oz), last menstrual period 07/12/2011, SpO2 100.00%. Physical Exam  Constitutional: She appears well-developed and well-nourished.  HENT:  Head: Normocephalic and atraumatic.  Eyes: Conjunctivae normal and EOM are normal. Pupils are equal, round, and reactive to light.  Cardiovascular: Normal rate.   Respiratory: Effort normal. No respiratory distress.  Musculoskeletal:       Legs: Neurological: She is alert.  Skin: There is erythema.  Psychiatric: She has a normal mood and affect. Her behavior is normal. Judgment and thought content normal.    Assessment/Plan: Bilateral lower extremity ulcers. Recommend vascular consult with venous and arterial studies to further evaluate the patient's options. She is not a candidate for microvascular free flap or even a rotational flap due to her over heath and size of the ulcers respectively.  At this point I am will to consider irrigation and debridement with Acell or integra placement and a VAC as I think this is her only remaining option. Please call with any questions.  [name] 11/14/2011, 4:51 PM

## 2011-11-14 NOTE — Progress Notes (Signed)
ANTIBIOTIC CONSULT NOTE - FOLLOW UP  Pharmacy Consult for Vancomycin Indication: Cellulitis   Allergies  Allergen Reactions  . Asa (Aspirin) Shortness Of Breath and Rash  . Dilaudid (Hydromorphone Hcl) Shortness Of Breath and Palpitations  . Erythromycin Other (See Comments)    Burn veins  . Indomethacin Other (See Comments)    Stroke symptoms  . Naproxen Other (See Comments)    Renal failure  . Alginate-Collagen (Wound Dressings) Rash  . Gold Salts Rash  . Silvadene (Silver Sulfadiazine) Rash  . Tylenol (Acetaminophen) Rash and Other (See Comments)    Salivation and hearing loss    Patient Measurements: Height: 5' 6.5" (168.9 cm) Weight: 218 lb 0.6 oz (98.9 kg) IBW/kg (Calculated) : 60.45   Vital Signs: Temp: 98.3 F (36.8 C) (10/20 0522) Temp src: Oral (10/20 0522) BP: 97/62 mmHg (10/20 0522) Pulse Rate: 70  (10/20 0522) Intake/Output from previous day: 10/19 0701 - 10/20 0700 In: 820 [P.O.:320; IV Piggyback:500] Out: -  Intake/Output from this shift:    Labs:  Basename 11/14/11 0500 11/13/11 0545 11/12/11 0547 11/11/11 1744 11/11/11 1342  WBC -- -- 6.9 6.8 8.1  HGB -- -- 9.5* 9.9* 10.9*  PLT -- -- 395 398 462*  LABCREA -- -- -- -- --  CREATININE 1.12* 1.16* 1.44* -- --   Estimated Creatinine Clearance: 70.4 ml/min (by C-G formula based on Cr of 1.12). No results found for this basename: VANCOTROUGH:2,VANCOPEAK:2,VANCORANDOM:2,GENTTROUGH:2,GENTPEAK:2,GENTRANDOM:2,TOBRATROUGH:2,TOBRAPEAK:2,TOBRARND:2,AMIKACINPEAK:2,AMIKACINTROU:2,AMIKACIN:2, in the last 72 hours   Microbiology: Recent Results (from the past 720 hour(s))  WOUND CULTURE     Status: Normal (Preliminary result)   Collection Time   11/12/11 12:43 AM      Component Value Range Status Comment   Specimen Description LEG RIGHT   Final    Special Requests NONE   Final    Gram Stain     Final    Value: NO WBC SEEN     NO SQUAMOUS EPITHELIAL CELLS SEEN     NO ORGANISMS SEEN   Culture FEW  PSEUDOMONAS AERUGINOSA   Final    Report Status PENDING   Incomplete   WOUND CULTURE     Status: Normal   Collection Time   11/12/11 12:43 AM      Component Value Range Status Comment   Specimen Description WOUND LEFT LEG   Final    Special Requests NONE   Final    Gram Stain     Final    Value: NO WBC SEEN     NO SQUAMOUS EPITHELIAL CELLS SEEN     NO ORGANISMS SEEN   Culture     Final    Value: MULTIPLE ORGANISMS PRESENT, NONE PREDOMINANT     Note: NO STAPHYLOCOCCUS AUREUS ISOLATED NO GROUP A STREP (S.PYOGENES) ISOLATED   Report Status 11/14/2011 FINAL   Final     Assessment: 51 YOF on vancomycin for b/l leg ulcer, afebrile, wound cx ngtd. Pt. Had ARF on admission, renal function continue improving, scr down to 1.12 from 1.67, est. crcl ~ 5ml/min.  Goal of Therapy:  Vancomycin trough level 10-15 mcg/ml  Plan:  - Increase vancomycin to 1g IV Q 12hrs - f/u wound culture and renal function - vancomycin trough at steady state if continues.  Bayard Hugger, PharmD, BCPS  Clinical Pharmacist  Pager: [phone]  11/14/2011,10:14 AM

## 2011-11-15 DIAGNOSIS — G7000 Myasthenia gravis without (acute) exacerbation: Secondary | ICD-10-CM | POA: Diagnosis not present

## 2011-11-15 DIAGNOSIS — E876 Hypokalemia: Secondary | ICD-10-CM | POA: Diagnosis not present

## 2011-11-15 DIAGNOSIS — I10 Essential (primary) hypertension: Secondary | ICD-10-CM | POA: Diagnosis not present

## 2011-11-15 DIAGNOSIS — L97909 Non-pressure chronic ulcer of unspecified part of unspecified lower leg with unspecified severity: Secondary | ICD-10-CM | POA: Diagnosis not present

## 2011-11-15 DIAGNOSIS — L0390 Cellulitis, unspecified: Secondary | ICD-10-CM | POA: Diagnosis not present

## 2011-11-15 DIAGNOSIS — L0291 Cutaneous abscess, unspecified: Secondary | ICD-10-CM | POA: Diagnosis not present

## 2011-11-15 LAB — WOUND CULTURE: Gram Stain: NONE SEEN

## 2011-11-15 MED ORDER — CIPROFLOXACIN HCL 750 MG PO TABS
750.0000 mg | ORAL_TABLET | Freq: Two times a day (BID) | ORAL | Status: DC
  Administered 2011-11-15 – 2011-11-16 (×3): 750 mg via ORAL
  Filled 2011-11-15 (×5): qty 1

## 2011-11-15 MED ORDER — ADULT MULTIVITAMIN W/MINERALS CH
1.0000 | ORAL_TABLET | Freq: Every day | ORAL | Status: DC
  Administered 2011-11-16: 1 via ORAL
  Filled 2011-11-15: qty 1

## 2011-11-15 MED ORDER — FERROUS SULFATE 325 (65 FE) MG PO TABS
325.0000 mg | ORAL_TABLET | Freq: Every day | ORAL | Status: DC
  Administered 2011-11-16: 325 mg via ORAL
  Filled 2011-11-15: qty 2

## 2011-11-15 NOTE — Consult Note (Signed)
The patient is a 51 year old female with multiple medical problems. She is seen in consultation for evaluation of bilateral lower surety wounds. She has a long history of venous stasis disease. She has had multiple treatment modalities in the past to include Unna boot and Silvadene compression dressing. She reports that she is about the allergies Silvadene. She has had difficulty affording the dressings since she has had a great deal of drainage from these recently. In the past she has had skin grafts in [place], [facility], and [place]. These have all failed. He does not have any history of arterial insufficiency. She reports that she has had increasing pain this is persistent and also erythema around the wounds. She reports that she is unable to tolerate compression when she is having flareups of erythema.  Past Medical History  Diagnosis Date  . Asthma   . Hypertension   . CHF (congestive heart failure)   . Myasthenia gravis   . Heart murmur   . Venous stasis     History  Substance Use Topics  . Smoking status: Never Smoker   . Smokeless tobacco: Not on file  . Alcohol Use: No    History reviewed. No pertinent family history.  Allergies  Allergen Reactions  . Asa (Aspirin) Shortness Of Breath and Rash  . Dilaudid (Hydromorphone Hcl) Shortness Of Breath and Palpitations  . Erythromycin Other (See Comments)    Burn veins  . Indomethacin Other (See Comments)    Stroke symptoms  . Naproxen Other (See Comments)    Renal failure  . Alginate-Collagen (Wound Dressings) Rash  . Gold Salts Rash  . Silvadene (Silver Sulfadiazine) Rash  . Tylenol (Acetaminophen) Rash and Other (See Comments)    Salivation and hearing loss    Current facility-administered medications:0.9 %  sodium chloride infusion, 250 mL, Intravenous, PRN, Adeline C Viyuoh, MD;  albuterol (PROVENTIL HFA;VENTOLIN HFA) 108 (90 BASE) MCG/ACT inhaler 2 puff, 2 puff, Inhalation, Q4H PRN, Adeline C Viyuoh, MD;  albuterol  (PROVENTIL) (5 MG/ML) 0.5% nebulizer solution 2.5 mg, 2.5 mg, Nebulization, Q4H PRN, Kela Millin, MD, 2.5 mg at 11/13/11 0114 bisacodyl (DULCOLAX) EC tablet 10 mg, 10 mg, Oral, Daily PRN, Adeline C Viyuoh, MD;  ciprofloxacin (CIPRO) tablet 750 mg, 750 mg, Oral, BID, Adeline C Viyuoh, MD, 750 mg at 11/15/11 1215;  diphenhydrAMINE (BENADRYL) tablet 25-50 mg, 25-50 mg, Oral, QID PRN, Kela Millin, MD, 50 mg at 11/15/11 1216;  enoxaparin (LOVENOX) injection 40 mg, 40 mg, Subcutaneous, Q24H, Adeline C Viyuoh, MD, 40 mg at 11/14/11 2212 ferrous sulfate tablet 325-650 mg, 325-650 mg, Oral, QPC lunch, Adeline C Viyuoh, MD;  hydrocerin (EUCERIN) cream, , Topical, Daily, Adeline C Viyuoh, MD;  multivitamin with minerals tablet 1 tablet, 1 tablet, Oral, QPC lunch, Adeline C Viyuoh, MD;  ondansetron (ZOFRAN) injection 4 mg, 4 mg, Intravenous, Q6H PRN, Adeline C Viyuoh, MD;  ondansetron (ZOFRAN) tablet 4 mg, 4 mg, Oral, Q6H PRN, Adeline C Viyuoh, MD oxyCODONE (Oxy IR/ROXICODONE) immediate release tablet 5-10 mg, 5-10 mg, Oral, Q4H PRN, Adeline C Viyuoh, MD, 10 mg at 11/15/11 1757;  pyridostigmine (MESTINON) tablet 60 mg, 60 mg, Oral, QID, Adeline C Viyuoh, MD, 60 mg at 11/15/11 1757;  senna (SENOKOT) tablet 8.6 mg, 1 tablet, Oral, QHS, Adeline C Viyuoh, MD, 8.6 mg at 11/14/11 2224;  sodium chloride 0.9 % injection 3 mL, 3 mL, Intravenous, Q12H, Adeline C Viyuoh, MD, 3 mL at 11/15/11 1055 sodium chloride 0.9 % injection 3 mL, 3 mL,  Intravenous, PRN, Kela Millin, MD, 3 mL at 11/15/11 1226;  DISCONTD: ferrous sulfate tablet 325-650 mg, 325-650 mg, Oral, Q breakfast, Kela Millin, MD, 325 mg at 11/15/11 1610;  DISCONTD: multivitamin with minerals tablet 1 tablet, 1 tablet, Oral, Daily, Kela Millin, MD, 1 tablet at 11/15/11 1002 DISCONTD: vancomycin (VANCOCIN) IVPB 1000 mg/200 mL premix, 1,000 mg, Intravenous, Q12H, Riki Rusk, PHARMD, 1,000 mg at 11/15/11 1055  BP 109/52  Pulse 86  Temp 98.6 F (37  C) (Oral)  Resp 20  Ht 5' 6.5" (1.689 m)  Wt 218 lb 0.6 oz (98.9 kg)  BMI 34.66 kg/m2  SpO2 96%  LMP 07/12/2011  Body mass index is 34.66 kg/(m^2).   Physical exam: Well-developed well-nourished moderately obese black female in no acute distress Pulse status 2+ radial 2+ popliteal and 1-2+ dorsalis pedis pulses bilaterally Abdomen soft no masses noted moderate obesity Neurologically she is grossly intact Psychiatric: Normal affect Skin is noted for bilateral venous ulcers. These are noted on both legs. Moderate surrounding erythema. She does have chronic changes of venous hypertension.  Venous duplex today reveals no evidence of DVT. Bedside arterial Doppler reveals biphasic to triphasic signals in her anterior tibial and posterior tibial bilaterally.  Impression and plan chronic venous stasis disease with a long history of failed therapy. No evidence of arterial insufficiency. I did explain to the patient the critical importance of elevation and compression. Did not see any evidence of correctable venous stasis disease. She will continue wound management as is being directed by Dr. Kelly Splinter. We will see her again on an as-needed basis

## 2011-11-15 NOTE — Progress Notes (Signed)
[facility] PROGRESS NOTE  [name] UJW:[id number] DOB: 1960/04/03 DOA: 11/11/2011 PCP: Sheila Oats, MD  Assessment/Plan:  Principal Problem:  *Ulcers of both lower legs/ Cellulitis  -Appreciate Dr. Leonie Green Consultation.  She recommended Vascular consult.  Dr. Arbie Cookey has agreed to see the patient.  Dr. Kelly Splinter considering irrigation and debridement with Acell or integra placement and a VAC. -wound culture shows pseudomonas (few)  Sensitive to cipro. - vancomycin discontinued -- continue pain management   ARF (acute renal failure)  -resolved off diuretics, and ibuprofen.  Hypokalemia  -Secondary to diuretics, resolved -magnesium level wnl -2.4.  Hypertension  -Patient states she cannot take beta blockers because of her myasthenia  -BP remains controlled off meds, continue monitoring off diuretics/meds, and treat as appropriate  h/o Venous stasis   History of CHF  -Appears compensated, monitor fluid status esp with holding Maxzide secondary to ARF, follow and further treat accordingly   History of myasthenia gravis  -Continue outpatient medications  Code Status: full Family Communication directly with patient at bedside Disposition Plan: To home once medically ready   Consultants:  Plastic surgery-Dr. Kelly Splinter   Vascular - Dr. Arbie Cookey  Procedures:  None  Antibiotics:  Vanc -> Cipro  HPI/Subjective:  denies any new complaints, anxiously awaiting plastics eval.nurse doing dressing change  Objective: Filed Vitals:   11/14/11 1537 11/14/11 2114 11/15/11 0525 11/15/11 0900  BP: 123/81 114/72 114/77 100/63  Pulse: 82 92 75 77  Temp: 98.2 F (36.8 C) 98.3 F (36.8 C) 98.8 F (37.1 C) 98.8 F (37.1 C)  TempSrc: Oral Oral Oral Oral  Resp: 18 20 18 20   Height:      Weight:      SpO2: 100% 99% 100% 98%    Intake/Output Summary (Last 24 hours) at 11/15/11 1245 Last data filed at 11/15/11 1055  Gross per 24 hour  Intake    203 ml  Output       0 ml  Net    203 ml   Filed Weights   11/11/11 1700  Weight: 98.9 kg (218 lb 0.6 oz)    Exam:   General:  Alert and oriented x3, eating breakfast, pleasant.  Right eyelid droop.  Cardiovascular: RRR, normal S1-S2  Respiratory: lungs clear to auscultation, no crackles, no wheezes  Abdomen: Soft, bowel sounds present nontender non distended  Extremities: Bilateral lower legs with with large ulcers-L>R, mostly clean base with small areas of exudate.Edema and erythema much decreased..  Data Reviewed: Basic Metabolic Panel:  Lab 11/14/11 5621 11/13/11 0545 11/12/11 0547 11/11/11 1744 11/11/11 1342  NA 137 137 136 -- 139  K 3.9 3.3* 2.4* -- 3.0*  CL 104 103 100 -- 100  CO2 27 27 25  -- 22  GLUCOSE 100* 109* 107* -- 95  BUN 16 20 26* -- 31*  CREATININE 1.12* 1.16* 1.44* 1.65* 1.67*  CALCIUM 9.0 9.0 9.1 -- 9.8  MG -- 2.4 2.4 -- --  PHOS -- -- -- -- --   CBC:  Lab 11/12/11 0547 11/11/11 1744 11/11/11 1342  WBC 6.9 6.8 8.1  NEUTROABS -- -- 6.0  HGB 9.5* 9.9* 10.9*  HCT 29.4* 31.0* 33.6*  MCV 71.7* 70.8* 71.5*  PLT 395 398 462*     Recent Results (from the past 240 hour(s))  WOUND CULTURE     Status: Normal   Collection Time   11/12/11 12:43 AM      Component Value Range Status Comment   Specimen Description LEG RIGHT   Final  Special Requests NONE   Final    Gram Stain     Final    Value: NO WBC SEEN     NO SQUAMOUS EPITHELIAL CELLS SEEN     NO ORGANISMS SEEN   Culture FEW PSEUDOMONAS AERUGINOSA   Final    Report Status 11/15/2011 FINAL   Final    Organism ID, Bacteria PSEUDOMONAS AERUGINOSA   Final   WOUND CULTURE     Status: Normal   Collection Time   11/12/11 12:43 AM      Component Value Range Status Comment   Specimen Description WOUND LEFT LEG   Final    Special Requests NONE   Final    Gram Stain     Final    Value: NO WBC SEEN     NO SQUAMOUS EPITHELIAL CELLS SEEN     NO ORGANISMS SEEN   Culture     Final    Value: MULTIPLE ORGANISMS PRESENT,  NONE PREDOMINANT     Note: NO STAPHYLOCOCCUS AUREUS ISOLATED NO GROUP A STREP (S.PYOGENES) ISOLATED   Report Status 11/14/2011 FINAL   Final      Studies: No results found.  Scheduled Meds:    . ciprofloxacin  750 mg Oral BID  . enoxaparin (LOVENOX) injection  40 mg Subcutaneous Q24H  . ferrous sulfate  325-650 mg Oral QPC lunch  . hydrocerin   Topical Daily  . multivitamin with minerals  1 tablet Oral QPC lunch  . pyridostigmine  60 mg Oral QID  . senna  1 tablet Oral QHS  . sodium chloride  3 mL Intravenous Q12H  . DISCONTD: ferrous sulfate  325-650 mg Oral Q breakfast  . DISCONTD: multivitamin with minerals  1 tablet Oral Daily  . DISCONTD: vancomycin  1,000 mg Intravenous Q12H   Continuous Infusions:   Principal Problem:  *Ulcers of both lower legs Active Problems:  ARF (acute renal failure)  Cellulitis  Hypokalemia  Hypertension  Venous stasis    Time spent:62min    Stephani Police  [facility] Pager [phone]. If 8PM-8AM, please contact night-coverage at [email or web address], password [facility] 11/15/2011, 12:45 PM  LOS: 4 days

## 2011-11-15 NOTE — Progress Notes (Signed)
Attending Note  I have seen and examined pt, agree with Mikel Cella eval and plan. Pt denies any c/o today, she has more questions she would like to ask Dr Kelly Splinter before the planned surgery. Will await Dr Bosie Helper eval. Pt's prealbumin slighly low- will consult nutrition as per Dr Leonie Green recs.   [name] MD  [phone]

## 2011-11-16 DIAGNOSIS — L97909 Non-pressure chronic ulcer of unspecified part of unspecified lower leg with unspecified severity: Secondary | ICD-10-CM | POA: Diagnosis not present

## 2011-11-16 DIAGNOSIS — L0390 Cellulitis, unspecified: Secondary | ICD-10-CM | POA: Diagnosis not present

## 2011-11-16 DIAGNOSIS — I10 Essential (primary) hypertension: Secondary | ICD-10-CM | POA: Diagnosis not present

## 2011-11-16 DIAGNOSIS — L0291 Cutaneous abscess, unspecified: Secondary | ICD-10-CM | POA: Diagnosis not present

## 2011-11-16 DIAGNOSIS — N179 Acute kidney failure, unspecified: Secondary | ICD-10-CM | POA: Diagnosis not present

## 2011-11-16 MED ORDER — CIPROFLOXACIN HCL 750 MG PO TABS: 750.0000 mg | ORAL_TABLET | Freq: Two times a day (BID) | ORAL | Status: DC

## 2011-11-16 MED ORDER — OXYCODONE HCL 5 MG PO TABS: 5.0000 mg | ORAL_TABLET | ORAL | Status: DC | PRN

## 2011-11-16 NOTE — Progress Notes (Signed)
Pt. discharge to floor,verbalized understanding of discharged instruction,medication,restriction,diet and follow up appointment.Baseline Vitals sign stable,Pt comfortable,no sign and symptom of distress. 

## 2011-11-16 NOTE — Plan of Care (Signed)
Problem: Limited Adherence to Nutrition-Related Recommendations (NB-1.6) Goal: Nutrition education Formal process to instruct or train a patient/client in a skill or to impart knowledge to help patients/clients voluntarily manage or modify food choices and eating behavior to maintain or improve health.  Outcome: Completed/Met Date Met:  11/16/11 RD consulted for pt with low Pre-albumin and chronic wounds on BLE.   RD provided pt with handouts from the Academy [employer] with lists of high protein foods. Encouraged pt to include protein in all meals and snacks daily. Pt states that she has been previously educated on high protein intake, but cost limits her intake. RD provided some examples of lower cost protein foods such as beans (with rice to make complete protein), milk/yogurt, and canned chicken or tuna. Pt agreeable to trying to include more of these foods into daily meals. Pt understands the importance of protein rich foods in wound healing.   Body mass index is 34.66 kg/(m^2). Pt is obese class 1 per current BMI.  Current diet is Low sodium/Heart healthy, pt is consuming ~100% of meals at this time.   Chart reviewed, no additional nutrition interventions at this time. Please re-consult as needed.   Clarene Duke RD, LDN Pager [phone] After Hours pager [phone]

## 2011-11-16 NOTE — Discharge Summary (Signed)
Physician Discharge Summary  [name] ZOX:[id number] DOB: 1960/12/16 DOA: 11/11/2011  PCP: Sheila Oats, MD  Admit date: 11/11/2011 Discharge date: 11/16/2011  Recommendations for Outpatient Follow-up:  1.  Wound Care RN - Home Health 2.  Patient to follow up with Dr. Kelly Splinter for Debridment on possibly 10/28 or 10/30.  Will likely need wound vac afterwards.  Discharge Diagnoses:  Principal Problem:  *Ulcers of both lower legs Active Problems:  ARF (acute renal failure)  Cellulitis  Hypokalemia  Hypertension  Venous stasis     Discharge Condition: stable  Diet recommendation: heart healthy, protein rich diet  History of present illness:  [name] is a 51 y.o. female with history of venous stasis and extensive bilateral lower extremity ulcers since 2006 who presents with above complaints. She states that she has had multiple skin grafts over the years that have failed--first in [place] in 2008, and then in 2009, 10, 11 she had skin grafts done at [facility] and [facility] but they all failed. She reports she has been seen at the wound care Center over the years and differentiate treatments have been tried, but that her last visit there was in June. She reports that DuoDERM has seemed to work in the past but she has not been able to afford them due to no insurance-has not had any since September. In the past 2-3 weeks she states that she has had increasing drainage-pus- like and blood tinged from the leg ulcers as well as increased pain and swelling. She reports that because of the pain and swelling she has had difficulty ambulating. She denies any change in her baseline shortness of breath which she attributes to her weight and history of asthma. She denies fevers. Patient also denies chest pain. She was seen in the ED and a white cell count was normal, but because of the extensive draining ulcers/cellulitis she was admitted for further evaluation and management.  Hospital Course:    *Ulcers of both lower legs/ Cellulitis  -Appreciate Dr. Leonie Green Consultation. She recommended Vascular and Nutrition consultations. Dr. Kelly Splinter considering irrigation and debridement with Acell or integra placement and a VAC.  I have requested that her office arrange an appointment with the patient to answer her questions before debridement procedure. -wound culture shows pseudomonas (few) Sensitive to cipro.  She was discharged with a prescription for cipro.  Dr. Kelly Splinter will determine necessary antibiotic therapy going forward.  ARF (acute renal failure)  -Resolved off diuretics, and ibuprofen. Will not restart Maxzide at this point as her blood pressure is remaining within normal limits.  Hypokalemia  -Secondary to diuretics, resolved  -magnesium level wnl -2.4.   Hypertension  -Patient states she cannot take beta blockers because of her myasthenia  -BP remains controlled off meds, continue monitoring off diuretics/meds, and treat as appropriate   h/o Venous stasis   History of CHF  -Appears compensated, monitor fluid status esp with holding Maxzide secondary to ARF, follow and further treat accordingly   History of myasthenia gravis  -Continue outpatient medications  Consultations:  Dr. Rhona Leavens, Plastic Surgery  Dr. Karie Schwalbe. Early, Vascular Surgery (Did not see evidence of correctable venous stasis disease.)  Nutrition (recommended protein rich diet, and provided patient education)  Discharge Exam: Filed Vitals:   11/16/11 0600  BP: 112/65  Pulse: 67  Temp: 98.4 F (36.9 C)  Resp:    Filed Vitals:   11/15/11 1750 11/15/11 1843 11/15/11 2151 11/16/11 0600  BP: 110/62 109/52 122/61 112/65  Pulse: 80 86 75  67  Temp: 98.8 F (37.1 C) 98.6 F (37 C) 98.6 F (37 C) 98.4 F (36.9 C)  TempSrc: Oral Oral Oral Oral  Resp: 20 20 16    Height:      Weight:      SpO2:  96% 98% 96%   General: A&O, talkative Cardiovascular: RRR, NO M/R/G Respiratory: CTA, no W/C/R Abdomen:   Soft, ND, NT, +BS Extremities:  Lower extremities wrapped in gauze bilaterally (bandage clean and dry)  Discharge Instructions  Discharge Orders    Future Orders Please Complete By Expires   Diet - low sodium heart healthy      Increase activity slowly          Medication List     As of 11/16/2011  2:00 PM    STOP taking these medications         triamterene-hydrochlorothiazide 75-50 MG per tablet   Commonly known as: MAXZIDE      TAKE these medications         albuterol 108 (90 BASE) MCG/ACT inhaler   Commonly known as: PROVENTIL HFA;VENTOLIN HFA   Inhale 4 puffs into the lungs every 6 (six) hours as needed. For shortness of breath      ciprofloxacin 750 MG tablet   Commonly known as: CIPRO   Take 1 tablet (750 mg total) by mouth 2 (two) times daily.      COD LIVER OIL PO   Take 2 capsules by mouth daily.      diphenhydrAMINE 25 MG tablet   Commonly known as: BENADRYL   Take 50 mg by mouth 4 (four) times daily.      EPIPEN 0.3 mg/0.3 mL Devi   Generic drug: EPINEPHrine   Inject 0.3 mg into the muscle as needed. For allergic reaction      ferrous sulfate 325 (65 FE) MG tablet   Take 325-650 mg by mouth daily with breakfast.      Fish Oil 500 MG Caps   Take 1,000 mg by mouth daily.      Garlic Oil 1000 MG Caps   Take 1,000 mg by mouth daily.      ibuprofen 200 MG tablet   Commonly known as: ADVIL,MOTRIN   Take 2,000 mg by mouth 4 (four) times daily. Takes 10 tablets to get 2000mg       multivitamin with minerals Tabs   Take 1 tablet by mouth daily. 1 gummy      nivea cream   Apply 1 application topically daily. Apply all over body      oxyCODONE 5 MG immediate release tablet   Commonly known as: Oxy IR/ROXICODONE   Take 1-2 tablets (5-10 mg total) by mouth every 4 (four) hours as needed.      pyridostigmine 60 MG tablet   Commonly known as: MESTINON   Take 60 mg by mouth 4 (four) times daily.      vitamin E 100 UNIT capsule   Take 100 Units by  mouth daily.           Follow-up Information    Follow up with Emeterio Reeve, MD. On 11/18/2011. (10:45 am bring meds, and insurance card)    Contact information:   [address] [place] [address] [phone]       Follow up with [facility], DO. (Dr. Leonie Green office is to call you with an appointment)    Contact information:   [address] [place] [address] [phone]  The results of significant diagnostics from this hospitalization (including imaging, microbiology, ancillary and laboratory) are listed below for reference.    Significant Diagnostic Studies: No results found.  Microbiology: Recent Results (from the past 240 hour(s))  WOUND CULTURE     Status: Normal   Collection Time   11/12/11 12:43 AM      Component Value Range Status Comment   Specimen Description LEG RIGHT   Final    Special Requests NONE   Final    Gram Stain     Final    Value: NO WBC SEEN     NO SQUAMOUS EPITHELIAL CELLS SEEN     NO ORGANISMS SEEN   Culture FEW PSEUDOMONAS AERUGINOSA   Final    Report Status 11/15/2011 FINAL   Final    Organism ID, Bacteria PSEUDOMONAS AERUGINOSA   Final   WOUND CULTURE     Status: Normal   Collection Time   11/12/11 12:43 AM      Component Value Range Status Comment   Specimen Description WOUND LEFT LEG   Final    Special Requests NONE   Final    Gram Stain     Final    Value: NO WBC SEEN     NO SQUAMOUS EPITHELIAL CELLS SEEN     NO ORGANISMS SEEN   Culture     Final    Value: MULTIPLE ORGANISMS PRESENT, NONE PREDOMINANT     Note: NO STAPHYLOCOCCUS AUREUS ISOLATED NO GROUP A STREP (S.PYOGENES) ISOLATED   Report Status 11/14/2011 FINAL   Final      Labs: Basic Metabolic Panel:  Lab 11/14/11 1610 11/13/11 0545 11/12/11 0547 11/11/11 1744 11/11/11 1342  NA 137 137 136 -- 139  K 3.9 3.3* 2.4* -- 3.0*  CL 104 103 100 -- 100  CO2 27 27 25  -- 22  GLUCOSE 100* 109* 107* -- 95  BUN 16 20 26* -- 31*    CREATININE 1.12* 1.16* 1.44* 1.65* 1.67*  CALCIUM 9.0 9.0 9.1 -- 9.8  MG -- 2.4 2.4 -- --  PHOS -- -- -- -- --   CBC:  Lab 11/12/11 0547 11/11/11 1744 11/11/11 1342  WBC 6.9 6.8 8.1  NEUTROABS -- -- 6.0  HGB 9.5* 9.9* 10.9*  HCT 29.4* 31.0* 33.6*  MCV 71.7* 70.8* 71.5*  PLT 395 398 462*    Time coordinating discharge: 45 min.  Signed:  Algis Downs, PA-C [facility] Pager: [phone]  [facility] 11/16/2011, 2:00 PM

## 2011-11-16 NOTE — Progress Notes (Signed)
Clinical Social Work Department BRIEF PSYCHOSOCIAL ASSESSMENT 11/16/2011  Patient:  [name],[name]     Account Number:  [id number]     Admit date:  11/11/2011  Clinical Social Worker:  Dennison Bulla  Date/Time:  11/16/2011 10:30 AM  Referred by:  Physician  Date Referred:  11/16/2011 Referred for  Transportation assistance   Other Referral:   Interview type:  Patient Other interview type:    PSYCHOSOCIAL DATA Living Status:  ALONE Admitted from facility:   Level of care:   Primary support name:  None Primary support relationship to patient:   Degree of support available:   Lacking    CURRENT CONCERNS Current Concerns  Other - See comment   Other Concerns:   Transportation    SOCIAL WORK ASSESSMENT / PLAN CSW received referral from MD to assist patient with transportation home at dc. CSW reviewed chart and met with patient at bedside. No visitors present.    CSW introduced myself and explained role. Patient lives alone but is agreeable to returning home. Patient will have HH at dc. Patient reports that she does not have any supports to assist with transportation. Patient reports she uses [facility] and agreeable to this at dc. CSW called [facility] and received pick up time for 1415 at main entrance. CSW informed patient and RN of scheduled pick up time. CSW is signing off but available if needed.   Assessment/plan status:  No Further Intervention Required Other assessment/ plan:   Information/referral to community resources:   [facility] pick up    PATIENT'S/FAMILY'S RESPONSE TO PLAN OF CARE: Patient alert and oriented. Patient agreeable to CSW involvement and reports that [facility] would not let her book an appointment on the same day unless it was an emergency. Patient agreeable to CSW setting up transport.

## 2011-11-18 DIAGNOSIS — G7000 Myasthenia gravis without (acute) exacerbation: Secondary | ICD-10-CM | POA: Diagnosis not present

## 2011-11-18 DIAGNOSIS — M129 Arthropathy, unspecified: Secondary | ICD-10-CM | POA: Diagnosis not present

## 2011-11-18 DIAGNOSIS — Z5189 Encounter for other specified aftercare: Secondary | ICD-10-CM | POA: Diagnosis not present

## 2011-11-18 DIAGNOSIS — I509 Heart failure, unspecified: Secondary | ICD-10-CM | POA: Diagnosis not present

## 2011-11-18 DIAGNOSIS — B965 Pseudomonas (aeruginosa) (mallei) (pseudomallei) as the cause of diseases classified elsewhere: Secondary | ICD-10-CM | POA: Diagnosis not present

## 2011-11-18 DIAGNOSIS — N179 Acute kidney failure, unspecified: Secondary | ICD-10-CM | POA: Diagnosis not present

## 2011-11-18 DIAGNOSIS — D649 Anemia, unspecified: Secondary | ICD-10-CM | POA: Diagnosis not present

## 2011-11-18 DIAGNOSIS — N183 Chronic kidney disease, stage 3 unspecified: Secondary | ICD-10-CM | POA: Diagnosis not present

## 2011-11-19 DIAGNOSIS — I872 Venous insufficiency (chronic) (peripheral): Secondary | ICD-10-CM | POA: Diagnosis not present

## 2011-11-19 DIAGNOSIS — I509 Heart failure, unspecified: Secondary | ICD-10-CM | POA: Diagnosis not present

## 2011-11-19 DIAGNOSIS — L97809 Non-pressure chronic ulcer of other part of unspecified lower leg with unspecified severity: Secondary | ICD-10-CM | POA: Diagnosis not present

## 2011-11-19 DIAGNOSIS — I10 Essential (primary) hypertension: Secondary | ICD-10-CM | POA: Diagnosis not present

## 2011-11-19 DIAGNOSIS — L02419 Cutaneous abscess of limb, unspecified: Secondary | ICD-10-CM | POA: Diagnosis not present

## 2011-11-19 DIAGNOSIS — L03119 Cellulitis of unspecified part of limb: Secondary | ICD-10-CM | POA: Diagnosis not present

## 2011-11-19 NOTE — Progress Notes (Signed)
Need orders for 10-30 surgery, thanks

## 2011-11-22 ENCOUNTER — Encounter (HOSPITAL_BASED_OUTPATIENT_CLINIC_OR_DEPARTMENT_OTHER): Payer: Medicare Other

## 2011-11-22 DIAGNOSIS — L02419 Cutaneous abscess of limb, unspecified: Secondary | ICD-10-CM | POA: Diagnosis not present

## 2011-11-22 DIAGNOSIS — L97809 Non-pressure chronic ulcer of other part of unspecified lower leg with unspecified severity: Secondary | ICD-10-CM | POA: Diagnosis not present

## 2011-11-22 DIAGNOSIS — I10 Essential (primary) hypertension: Secondary | ICD-10-CM | POA: Diagnosis not present

## 2011-11-22 DIAGNOSIS — I872 Venous insufficiency (chronic) (peripheral): Secondary | ICD-10-CM | POA: Diagnosis not present

## 2011-11-22 DIAGNOSIS — I509 Heart failure, unspecified: Secondary | ICD-10-CM | POA: Diagnosis not present

## 2011-11-23 ENCOUNTER — Inpatient Hospital Stay (HOSPITAL_COMMUNITY): Admission: RE | Admit: 2011-11-23 | Discharge: 2011-11-23 | Payer: Medicare Other | Source: Ambulatory Visit

## 2011-11-24 ENCOUNTER — Ambulatory Visit (HOSPITAL_COMMUNITY): Admission: RE | Admit: 2011-11-24 | Payer: Medicare Other | Source: Ambulatory Visit | Admitting: Plastic Surgery

## 2011-11-24 ENCOUNTER — Encounter (HOSPITAL_COMMUNITY): Admission: RE | Payer: Self-pay | Source: Ambulatory Visit

## 2011-11-24 SURGERY — IRRIGATION AND DEBRIDEMENT EXTREMITY
Anesthesia: General | Laterality: Bilateral

## 2011-11-26 DIAGNOSIS — I872 Venous insufficiency (chronic) (peripheral): Secondary | ICD-10-CM | POA: Diagnosis not present

## 2011-11-26 DIAGNOSIS — I10 Essential (primary) hypertension: Secondary | ICD-10-CM | POA: Diagnosis not present

## 2011-11-26 DIAGNOSIS — L97809 Non-pressure chronic ulcer of other part of unspecified lower leg with unspecified severity: Secondary | ICD-10-CM | POA: Diagnosis not present

## 2011-11-26 DIAGNOSIS — L03119 Cellulitis of unspecified part of limb: Secondary | ICD-10-CM | POA: Diagnosis not present

## 2011-11-26 DIAGNOSIS — I509 Heart failure, unspecified: Secondary | ICD-10-CM | POA: Diagnosis not present

## 2011-11-26 DIAGNOSIS — L02419 Cutaneous abscess of limb, unspecified: Secondary | ICD-10-CM | POA: Diagnosis not present

## 2011-11-29 ENCOUNTER — Encounter (HOSPITAL_BASED_OUTPATIENT_CLINIC_OR_DEPARTMENT_OTHER): Payer: Medicare Other | Attending: Plastic Surgery

## 2011-11-29 DIAGNOSIS — E875 Hyperkalemia: Secondary | ICD-10-CM | POA: Diagnosis not present

## 2011-12-01 DIAGNOSIS — I509 Heart failure, unspecified: Secondary | ICD-10-CM | POA: Diagnosis not present

## 2011-12-01 DIAGNOSIS — L03119 Cellulitis of unspecified part of limb: Secondary | ICD-10-CM | POA: Diagnosis not present

## 2011-12-01 DIAGNOSIS — I10 Essential (primary) hypertension: Secondary | ICD-10-CM | POA: Diagnosis not present

## 2011-12-01 DIAGNOSIS — L97809 Non-pressure chronic ulcer of other part of unspecified lower leg with unspecified severity: Secondary | ICD-10-CM | POA: Diagnosis not present

## 2011-12-01 DIAGNOSIS — I872 Venous insufficiency (chronic) (peripheral): Secondary | ICD-10-CM | POA: Diagnosis not present

## 2011-12-01 DIAGNOSIS — L02419 Cutaneous abscess of limb, unspecified: Secondary | ICD-10-CM | POA: Diagnosis not present

## 2011-12-02 DIAGNOSIS — L03119 Cellulitis of unspecified part of limb: Secondary | ICD-10-CM | POA: Diagnosis not present

## 2011-12-02 DIAGNOSIS — I872 Venous insufficiency (chronic) (peripheral): Secondary | ICD-10-CM | POA: Diagnosis not present

## 2011-12-02 DIAGNOSIS — I10 Essential (primary) hypertension: Secondary | ICD-10-CM | POA: Diagnosis not present

## 2011-12-02 DIAGNOSIS — L97809 Non-pressure chronic ulcer of other part of unspecified lower leg with unspecified severity: Secondary | ICD-10-CM | POA: Diagnosis not present

## 2011-12-02 DIAGNOSIS — L02419 Cutaneous abscess of limb, unspecified: Secondary | ICD-10-CM | POA: Diagnosis not present

## 2011-12-02 DIAGNOSIS — I509 Heart failure, unspecified: Secondary | ICD-10-CM | POA: Diagnosis not present

## 2011-12-03 DIAGNOSIS — I509 Heart failure, unspecified: Secondary | ICD-10-CM | POA: Diagnosis not present

## 2011-12-03 DIAGNOSIS — L97809 Non-pressure chronic ulcer of other part of unspecified lower leg with unspecified severity: Secondary | ICD-10-CM | POA: Diagnosis not present

## 2011-12-03 DIAGNOSIS — L03119 Cellulitis of unspecified part of limb: Secondary | ICD-10-CM | POA: Diagnosis not present

## 2011-12-03 DIAGNOSIS — I10 Essential (primary) hypertension: Secondary | ICD-10-CM | POA: Diagnosis not present

## 2011-12-03 DIAGNOSIS — I872 Venous insufficiency (chronic) (peripheral): Secondary | ICD-10-CM | POA: Diagnosis not present

## 2011-12-03 DIAGNOSIS — L02419 Cutaneous abscess of limb, unspecified: Secondary | ICD-10-CM | POA: Diagnosis not present

## 2011-12-04 ENCOUNTER — Encounter (HOSPITAL_COMMUNITY): Payer: Self-pay

## 2011-12-04 ENCOUNTER — Emergency Department (INDEPENDENT_AMBULATORY_CARE_PROVIDER_SITE_OTHER)
Admission: EM | Admit: 2011-12-04 | Discharge: 2011-12-04 | Disposition: A | Discharge status: Home / Self Care | Payer: Medicare Other | Source: Home / Self Care | Attending: Family Medicine | Admitting: Family Medicine

## 2011-12-04 DIAGNOSIS — G894 Chronic pain syndrome: Secondary | ICD-10-CM | POA: Diagnosis not present

## 2011-12-04 DIAGNOSIS — G7000 Myasthenia gravis without (acute) exacerbation: Secondary | ICD-10-CM | POA: Diagnosis not present

## 2011-12-04 MED ORDER — PYRIDOSTIGMINE BROMIDE 60 MG PO TABS: 60.0000 mg | ORAL_TABLET | Freq: Four times a day (QID) | ORAL | Status: DC

## 2011-12-04 MED ORDER — OXYCODONE-ACETAMINOPHEN 5-325 MG PO TABS: 1.0000 | ORAL_TABLET | ORAL | Status: DC | PRN

## 2011-12-04 NOTE — ED Notes (Signed)
Pt states she is out of her pyriclostigmine for her myasthenia and also needs pain med oxycodone refill. States she has appt on mon with her neurologist.

## 2011-12-04 NOTE — ED Provider Notes (Signed)
History     CSN: [id number]  Arrival date & time 12/04/11  1311   First MD Initiated Contact with Patient 12/04/11 1357      Chief Complaint  Patient presents with  . Medication Refill    pt states she need medication refills    (Consider location/radiation/quality/duration/timing/severity/associated sxs/prior treatment) Patient is a 51 y.o. female presenting with extremity pain. The history is provided by the patient.  Extremity Pain This is a chronic problem. The current episode started more than 1 week ago. The problem has not changed since onset.Associated symptoms comments: Requesting med refills until seen by neurologist on mon.for myaesthenia gravis..    Past Medical History  Diagnosis Date  . Asthma   . Hypertension   . CHF (congestive heart failure)   . Myasthenia gravis   . Heart murmur   . Venous stasis     Past Surgical History  Procedure Date  . Tonsillectomy   . Myomectomy     No family history on file.  History  Substance Use Topics  . Smoking status: Never Smoker   . Smokeless tobacco: Not on file  . Alcohol Use: No    OB History    Grav Para Term Preterm Abortions TAB SAB Ect Mult Living                  Review of Systems  Constitutional: Negative.   Musculoskeletal: Positive for gait problem.  Skin: Positive for wound.    Allergies  Asa; Dilaudid; Erythromycin; Indomethacin; Naproxen; Alginate-collagen; Gold salts; Silvadene; and Tylenol  Home Medications   Current Outpatient Rx  Name  Route  Sig  Dispense  Refill  . ALBUTEROL SULFATE HFA 108 (90 BASE) MCG/ACT IN AERS   Inhalation   Inhale 4 puffs into the lungs every 6 (six) hours as needed. For shortness of breath         . CIPROFLOXACIN HCL 750 MG PO TABS   Oral   Take 1 tablet (750 mg total) by mouth 2 (two) times daily.   14 tablet   0   . COD LIVER OIL PO   Oral   Take 2 capsules by mouth daily.         Marland Kitchen DIPHENHYDRAMINE HCL 25 MG PO TABS   Oral   Take 50 mg  by mouth 4 (four) times daily.         Marland Kitchen NIVEA EX CREA   Topical   Apply 1 application topically daily. Apply all over body         . EPINEPHRINE 0.3 MG/0.3ML IJ DEVI   Intramuscular   Inject 0.3 mg into the muscle as needed. For allergic reaction         . FERROUS SULFATE 325 (65 FE) MG PO TABS   Oral   Take 325-650 mg by mouth daily with breakfast.         . GARLIC OIL 1000 MG PO CAPS   Oral   Take 1,000 mg by mouth daily.         . IBUPROFEN 200 MG PO TABS   Oral   Take 2,000 mg by mouth 4 (four) times daily. Takes 10 tablets to get 2000mg          . ADULT MULTIVITAMIN W/MINERALS CH   Oral   Take 1 tablet by mouth daily. 1 gummy         . FISH OIL 500 MG PO CAPS   Oral   Take 1,000 mg  by mouth daily.         . OXYCODONE HCL 5 MG PO TABS   Oral   Take 1-2 tablets (5-10 mg total) by mouth every 4 (four) hours as needed.   30 tablet   0   . PYRIDOSTIGMINE BROMIDE 60 MG PO TABS   Oral   Take 60 mg by mouth 4 (four) times daily.         Marland Kitchen VITAMIN E 100 UNITS PO CAPS   Oral   Take 100 Units by mouth daily.         . OXYCODONE-ACETAMINOPHEN 5-325 MG PO TABS   Oral   Take 1 tablet by mouth every 4 (four) hours as needed for pain.   30 tablet   0   . PYRIDOSTIGMINE BROMIDE 60 MG PO TABS   Oral   Take 1 tablet (60 mg total) by mouth 4 (four) times daily.   60 tablet   1     BP 124/70  Pulse 67  Temp 98 F (36.7 C) (Oral)  Resp 20  SpO2 100%  LMP 07/12/2011  Physical Exam  Nursing note and vitals reviewed. Constitutional: She is oriented to person, place, and time. She appears well-developed and well-nourished. No distress.  Neurological: She is alert and oriented to person, place, and time.  Skin: Skin is warm and dry.       Leg ulcerations not undressed, has been seeing wound care team, and taking cipro.    ED Course  Procedures (including critical care time)  Labs Reviewed - No data to display No results found.   1.  Chronic pain disorder   2. Congenital myasthenia gravis       MDM          Linna Hoff, MD 12/04/11 1439

## 2011-12-05 NOTE — Discharge Summary (Signed)
I have seen and examined pt, agre with [name] eval and plan. He is medically stable for discharge at this time for outpt follow up.  [name] MD [facility]

## 2011-12-06 DIAGNOSIS — L97809 Non-pressure chronic ulcer of other part of unspecified lower leg with unspecified severity: Secondary | ICD-10-CM | POA: Diagnosis not present

## 2011-12-06 DIAGNOSIS — I872 Venous insufficiency (chronic) (peripheral): Secondary | ICD-10-CM | POA: Diagnosis not present

## 2011-12-06 DIAGNOSIS — L02419 Cutaneous abscess of limb, unspecified: Secondary | ICD-10-CM | POA: Diagnosis not present

## 2011-12-06 DIAGNOSIS — R6889 Other general symptoms and signs: Secondary | ICD-10-CM | POA: Diagnosis not present

## 2011-12-06 DIAGNOSIS — I509 Heart failure, unspecified: Secondary | ICD-10-CM | POA: Diagnosis not present

## 2011-12-06 DIAGNOSIS — G7000 Myasthenia gravis without (acute) exacerbation: Secondary | ICD-10-CM | POA: Diagnosis not present

## 2011-12-06 DIAGNOSIS — L03119 Cellulitis of unspecified part of limb: Secondary | ICD-10-CM | POA: Diagnosis not present

## 2011-12-06 DIAGNOSIS — I10 Essential (primary) hypertension: Secondary | ICD-10-CM | POA: Diagnosis not present

## 2011-12-08 DIAGNOSIS — I872 Venous insufficiency (chronic) (peripheral): Secondary | ICD-10-CM | POA: Diagnosis not present

## 2011-12-08 DIAGNOSIS — I10 Essential (primary) hypertension: Secondary | ICD-10-CM | POA: Diagnosis not present

## 2011-12-08 DIAGNOSIS — L97809 Non-pressure chronic ulcer of other part of unspecified lower leg with unspecified severity: Secondary | ICD-10-CM | POA: Diagnosis not present

## 2011-12-08 DIAGNOSIS — I509 Heart failure, unspecified: Secondary | ICD-10-CM | POA: Diagnosis not present

## 2011-12-08 DIAGNOSIS — L03119 Cellulitis of unspecified part of limb: Secondary | ICD-10-CM | POA: Diagnosis not present

## 2011-12-08 DIAGNOSIS — L02419 Cutaneous abscess of limb, unspecified: Secondary | ICD-10-CM | POA: Diagnosis not present

## 2011-12-10 DIAGNOSIS — I872 Venous insufficiency (chronic) (peripheral): Secondary | ICD-10-CM | POA: Diagnosis not present

## 2011-12-10 DIAGNOSIS — I10 Essential (primary) hypertension: Secondary | ICD-10-CM | POA: Diagnosis not present

## 2011-12-10 DIAGNOSIS — L97809 Non-pressure chronic ulcer of other part of unspecified lower leg with unspecified severity: Secondary | ICD-10-CM | POA: Diagnosis not present

## 2011-12-10 DIAGNOSIS — L02419 Cutaneous abscess of limb, unspecified: Secondary | ICD-10-CM | POA: Diagnosis not present

## 2011-12-10 DIAGNOSIS — I509 Heart failure, unspecified: Secondary | ICD-10-CM | POA: Diagnosis not present

## 2011-12-14 DIAGNOSIS — L97809 Non-pressure chronic ulcer of other part of unspecified lower leg with unspecified severity: Secondary | ICD-10-CM | POA: Diagnosis not present

## 2011-12-14 DIAGNOSIS — I509 Heart failure, unspecified: Secondary | ICD-10-CM | POA: Diagnosis not present

## 2011-12-14 DIAGNOSIS — I872 Venous insufficiency (chronic) (peripheral): Secondary | ICD-10-CM | POA: Diagnosis not present

## 2011-12-14 DIAGNOSIS — L02419 Cutaneous abscess of limb, unspecified: Secondary | ICD-10-CM | POA: Diagnosis not present

## 2011-12-14 DIAGNOSIS — I10 Essential (primary) hypertension: Secondary | ICD-10-CM | POA: Diagnosis not present

## 2011-12-15 DIAGNOSIS — I10 Essential (primary) hypertension: Secondary | ICD-10-CM | POA: Diagnosis not present

## 2011-12-15 DIAGNOSIS — I872 Venous insufficiency (chronic) (peripheral): Secondary | ICD-10-CM | POA: Diagnosis not present

## 2011-12-15 DIAGNOSIS — I509 Heart failure, unspecified: Secondary | ICD-10-CM | POA: Diagnosis not present

## 2011-12-15 DIAGNOSIS — L03119 Cellulitis of unspecified part of limb: Secondary | ICD-10-CM | POA: Diagnosis not present

## 2011-12-15 DIAGNOSIS — L97809 Non-pressure chronic ulcer of other part of unspecified lower leg with unspecified severity: Secondary | ICD-10-CM | POA: Diagnosis not present

## 2011-12-15 DIAGNOSIS — L02419 Cutaneous abscess of limb, unspecified: Secondary | ICD-10-CM | POA: Diagnosis not present

## 2011-12-17 DIAGNOSIS — L97809 Non-pressure chronic ulcer of other part of unspecified lower leg with unspecified severity: Secondary | ICD-10-CM | POA: Diagnosis not present

## 2011-12-17 DIAGNOSIS — I872 Venous insufficiency (chronic) (peripheral): Secondary | ICD-10-CM | POA: Diagnosis not present

## 2011-12-17 DIAGNOSIS — I509 Heart failure, unspecified: Secondary | ICD-10-CM | POA: Diagnosis not present

## 2011-12-17 DIAGNOSIS — I10 Essential (primary) hypertension: Secondary | ICD-10-CM | POA: Diagnosis not present

## 2011-12-17 DIAGNOSIS — L02419 Cutaneous abscess of limb, unspecified: Secondary | ICD-10-CM | POA: Diagnosis not present

## 2011-12-20 ENCOUNTER — Encounter (HOSPITAL_BASED_OUTPATIENT_CLINIC_OR_DEPARTMENT_OTHER): Payer: Medicare Other

## 2011-12-20 DIAGNOSIS — I10 Essential (primary) hypertension: Secondary | ICD-10-CM | POA: Diagnosis not present

## 2011-12-20 DIAGNOSIS — L97809 Non-pressure chronic ulcer of other part of unspecified lower leg with unspecified severity: Secondary | ICD-10-CM | POA: Diagnosis not present

## 2011-12-20 DIAGNOSIS — I509 Heart failure, unspecified: Secondary | ICD-10-CM | POA: Diagnosis not present

## 2011-12-20 DIAGNOSIS — I872 Venous insufficiency (chronic) (peripheral): Secondary | ICD-10-CM | POA: Diagnosis not present

## 2011-12-20 DIAGNOSIS — L02419 Cutaneous abscess of limb, unspecified: Secondary | ICD-10-CM | POA: Diagnosis not present

## 2011-12-22 DIAGNOSIS — L97809 Non-pressure chronic ulcer of other part of unspecified lower leg with unspecified severity: Secondary | ICD-10-CM | POA: Diagnosis not present

## 2011-12-22 DIAGNOSIS — I509 Heart failure, unspecified: Secondary | ICD-10-CM | POA: Diagnosis not present

## 2011-12-22 DIAGNOSIS — I872 Venous insufficiency (chronic) (peripheral): Secondary | ICD-10-CM | POA: Diagnosis not present

## 2011-12-22 DIAGNOSIS — I10 Essential (primary) hypertension: Secondary | ICD-10-CM | POA: Diagnosis not present

## 2011-12-22 DIAGNOSIS — L02419 Cutaneous abscess of limb, unspecified: Secondary | ICD-10-CM | POA: Diagnosis not present

## 2011-12-24 DIAGNOSIS — I872 Venous insufficiency (chronic) (peripheral): Secondary | ICD-10-CM | POA: Diagnosis not present

## 2011-12-24 DIAGNOSIS — L02419 Cutaneous abscess of limb, unspecified: Secondary | ICD-10-CM | POA: Diagnosis not present

## 2011-12-24 DIAGNOSIS — I10 Essential (primary) hypertension: Secondary | ICD-10-CM | POA: Diagnosis not present

## 2011-12-24 DIAGNOSIS — I509 Heart failure, unspecified: Secondary | ICD-10-CM | POA: Diagnosis not present

## 2011-12-24 DIAGNOSIS — L97809 Non-pressure chronic ulcer of other part of unspecified lower leg with unspecified severity: Secondary | ICD-10-CM | POA: Diagnosis not present

## 2011-12-27 ENCOUNTER — Encounter (HOSPITAL_BASED_OUTPATIENT_CLINIC_OR_DEPARTMENT_OTHER): Payer: Medicare Other | Attending: Plastic Surgery

## 2011-12-27 DIAGNOSIS — Z79899 Other long term (current) drug therapy: Secondary | ICD-10-CM | POA: Diagnosis not present

## 2011-12-27 DIAGNOSIS — I872 Venous insufficiency (chronic) (peripheral): Secondary | ICD-10-CM | POA: Diagnosis not present

## 2011-12-27 DIAGNOSIS — I739 Peripheral vascular disease, unspecified: Secondary | ICD-10-CM | POA: Diagnosis not present

## 2011-12-27 DIAGNOSIS — M069 Rheumatoid arthritis, unspecified: Secondary | ICD-10-CM | POA: Diagnosis not present

## 2011-12-27 DIAGNOSIS — I10 Essential (primary) hypertension: Secondary | ICD-10-CM | POA: Diagnosis not present

## 2011-12-27 DIAGNOSIS — L97809 Non-pressure chronic ulcer of other part of unspecified lower leg with unspecified severity: Secondary | ICD-10-CM | POA: Diagnosis not present

## 2011-12-27 DIAGNOSIS — G7000 Myasthenia gravis without (acute) exacerbation: Secondary | ICD-10-CM | POA: Diagnosis not present

## 2011-12-27 NOTE — Progress Notes (Signed)
[facility]  NAME:  [name], [name]            ACCOUNT NO.:  [id number]  MEDICAL RECORD NO.:  [id number]      DATE OF BIRTH:  February 13, 1960  PHYSICIAN:  Wayland Denis, DO       VISIT DATE:  12/27/2011                                  OFFICE VISIT   CHIEF COMPLAINT:  Bilateral leg chronic venous insufficiency ulcers.  HISTORY OF PRESENT ILLNESS:  The patient is a 51 year old female who is known to myself and the clinic.  She has bilateral leg chronic venous insufficiency ulcers.  She has had these for a number of years.  She has undergone multiple treatment modalities, including Apligraf and silver dressings, collagen dressings, and skin grafts.  None of them have been successful according to the patient and according to the exam.  The patient has some clear ideas of her treatment plan and at this point, not open to suggestions that have been made other than a consult.  She is currently using ABDs at home.  No other dressings or products being used.  She was admitted for some renal issues and those have been resolved.  PAST MEDICAL HISTORY:  Positive for chronic heart failure, hypertension, peripheral vascular disease, rheumatoid arthritis, myasthenia gravis, thalassemia, has questionable asthma, blood clots to the left leg 10 years ago, and 5 and 6 years ago.  PAST SURGICAL HISTORY:  Bronchoscope, Apligraf placement, fibroid removal and tonsillectomy.  MEDICATIONS:  Triamterene, oxycodone, Advil, multivitamin, albuterol, EpiPen, iron, folic acid, B12, vitamin E, and pyridostigmine.  ALLERGIES:  ASPIRIN, DILAUDID, IODINE, ACETAMINOPHEN, INDOMETHACIN, NAPROXEN, SILVER ALGINATE AND ERYTHROMYCIN.  She occasionally takes Benadryl over-the-counter.  REVIEW OF SYSTEMS:  Otherwise, negative.  At least from her recent admission and discharge from the hospital.  SOCIAL HISTORY:  She lives at home.  Does not smoke.  PHYSICAL EXAMINATION:  She is alert.  She is  oriented.  She is cooperative for the exam.  Her glasses are on.  Pupils are equal. Extraocular muscles are intact.  She does not have any breathing difficulty.  Her pulses are regular.  Her lower extremity, she has bilateral circumferential ulcerations of the lower legs from the middle to lower third.  She has swelling, thickening of the skin.  She has redness in her feet.  It does not look cellulitic but hyperemic. ABDOMEN:  Soft, nontender.  Pulses are very difficult to palpate.  ASSESSMENT:  Bilateral lower leg chronic venous insufficiency ulcers. Recommend re-evaluation with Vascular Surgery.  Check a prealbumin.  We will get the labs from her recent family doctor visit.  We will also ask for a consult from Dr. Mardene Speak.  We had a very long discussion about treatment options.  At this point, I do not think that any of the Apligraf Dermagraft collagens or silvers are going to work or even improve her current condition plus she is not willing to do them.  I think at this point the only thing that would work is surgical debridement with ACell placement in the [facility].  She is not willing to do that either.  I do have no confidence that a skin graft would work.  I have nothing more than I know to offer her except for a second opinion, and we will send her to see Dr. Mardene Speak to  see if there is anything that he knows of that may be available to her.  Otherwise, I do not have any treatment to offer and she can followup with her family doctor and she is in agreement for this.     Wayland Denis, DO     CS/MEDQ  D:  12/27/2011  T:  12/27/2011  Job:  [id number]

## 2011-12-29 DIAGNOSIS — L02419 Cutaneous abscess of limb, unspecified: Secondary | ICD-10-CM | POA: Diagnosis not present

## 2011-12-29 DIAGNOSIS — I509 Heart failure, unspecified: Secondary | ICD-10-CM | POA: Diagnosis not present

## 2011-12-29 DIAGNOSIS — I10 Essential (primary) hypertension: Secondary | ICD-10-CM | POA: Diagnosis not present

## 2011-12-29 DIAGNOSIS — L97809 Non-pressure chronic ulcer of other part of unspecified lower leg with unspecified severity: Secondary | ICD-10-CM | POA: Diagnosis not present

## 2011-12-29 DIAGNOSIS — I872 Venous insufficiency (chronic) (peripheral): Secondary | ICD-10-CM | POA: Diagnosis not present

## 2011-12-31 DIAGNOSIS — L03119 Cellulitis of unspecified part of limb: Secondary | ICD-10-CM | POA: Diagnosis not present

## 2011-12-31 DIAGNOSIS — L97809 Non-pressure chronic ulcer of other part of unspecified lower leg with unspecified severity: Secondary | ICD-10-CM | POA: Diagnosis not present

## 2011-12-31 DIAGNOSIS — L02419 Cutaneous abscess of limb, unspecified: Secondary | ICD-10-CM | POA: Diagnosis not present

## 2011-12-31 DIAGNOSIS — I872 Venous insufficiency (chronic) (peripheral): Secondary | ICD-10-CM | POA: Diagnosis not present

## 2011-12-31 DIAGNOSIS — I10 Essential (primary) hypertension: Secondary | ICD-10-CM | POA: Diagnosis not present

## 2011-12-31 DIAGNOSIS — I509 Heart failure, unspecified: Secondary | ICD-10-CM | POA: Diagnosis not present

## 2012-01-03 DIAGNOSIS — L02419 Cutaneous abscess of limb, unspecified: Secondary | ICD-10-CM | POA: Diagnosis not present

## 2012-01-03 DIAGNOSIS — I509 Heart failure, unspecified: Secondary | ICD-10-CM | POA: Diagnosis not present

## 2012-01-03 DIAGNOSIS — I872 Venous insufficiency (chronic) (peripheral): Secondary | ICD-10-CM | POA: Diagnosis not present

## 2012-01-03 DIAGNOSIS — L97809 Non-pressure chronic ulcer of other part of unspecified lower leg with unspecified severity: Secondary | ICD-10-CM | POA: Diagnosis not present

## 2012-01-03 DIAGNOSIS — L03119 Cellulitis of unspecified part of limb: Secondary | ICD-10-CM | POA: Diagnosis not present

## 2012-01-03 DIAGNOSIS — I10 Essential (primary) hypertension: Secondary | ICD-10-CM | POA: Diagnosis not present

## 2012-01-05 DIAGNOSIS — I509 Heart failure, unspecified: Secondary | ICD-10-CM | POA: Diagnosis not present

## 2012-01-05 DIAGNOSIS — I872 Venous insufficiency (chronic) (peripheral): Secondary | ICD-10-CM | POA: Diagnosis not present

## 2012-01-05 DIAGNOSIS — L02419 Cutaneous abscess of limb, unspecified: Secondary | ICD-10-CM | POA: Diagnosis not present

## 2012-01-05 DIAGNOSIS — I10 Essential (primary) hypertension: Secondary | ICD-10-CM | POA: Diagnosis not present

## 2012-01-05 DIAGNOSIS — L97809 Non-pressure chronic ulcer of other part of unspecified lower leg with unspecified severity: Secondary | ICD-10-CM | POA: Diagnosis not present

## 2012-01-07 DIAGNOSIS — L97809 Non-pressure chronic ulcer of other part of unspecified lower leg with unspecified severity: Secondary | ICD-10-CM | POA: Diagnosis not present

## 2012-01-07 DIAGNOSIS — I509 Heart failure, unspecified: Secondary | ICD-10-CM | POA: Diagnosis not present

## 2012-01-07 DIAGNOSIS — L03119 Cellulitis of unspecified part of limb: Secondary | ICD-10-CM | POA: Diagnosis not present

## 2012-01-07 DIAGNOSIS — I10 Essential (primary) hypertension: Secondary | ICD-10-CM | POA: Diagnosis not present

## 2012-01-07 DIAGNOSIS — I872 Venous insufficiency (chronic) (peripheral): Secondary | ICD-10-CM | POA: Diagnosis not present

## 2012-01-07 DIAGNOSIS — L02419 Cutaneous abscess of limb, unspecified: Secondary | ICD-10-CM | POA: Diagnosis not present

## 2012-01-10 DIAGNOSIS — I509 Heart failure, unspecified: Secondary | ICD-10-CM | POA: Diagnosis not present

## 2012-01-10 DIAGNOSIS — I872 Venous insufficiency (chronic) (peripheral): Secondary | ICD-10-CM | POA: Diagnosis not present

## 2012-01-10 DIAGNOSIS — L03119 Cellulitis of unspecified part of limb: Secondary | ICD-10-CM | POA: Diagnosis not present

## 2012-01-10 DIAGNOSIS — L97809 Non-pressure chronic ulcer of other part of unspecified lower leg with unspecified severity: Secondary | ICD-10-CM | POA: Diagnosis not present

## 2012-01-10 DIAGNOSIS — I10 Essential (primary) hypertension: Secondary | ICD-10-CM | POA: Diagnosis not present

## 2012-01-10 DIAGNOSIS — L02419 Cutaneous abscess of limb, unspecified: Secondary | ICD-10-CM | POA: Diagnosis not present

## 2012-01-12 DIAGNOSIS — L03119 Cellulitis of unspecified part of limb: Secondary | ICD-10-CM | POA: Diagnosis not present

## 2012-01-12 DIAGNOSIS — I509 Heart failure, unspecified: Secondary | ICD-10-CM | POA: Diagnosis not present

## 2012-01-12 DIAGNOSIS — I10 Essential (primary) hypertension: Secondary | ICD-10-CM | POA: Diagnosis not present

## 2012-01-12 DIAGNOSIS — L97809 Non-pressure chronic ulcer of other part of unspecified lower leg with unspecified severity: Secondary | ICD-10-CM | POA: Diagnosis not present

## 2012-01-12 DIAGNOSIS — I872 Venous insufficiency (chronic) (peripheral): Secondary | ICD-10-CM | POA: Diagnosis not present

## 2012-01-12 DIAGNOSIS — L02419 Cutaneous abscess of limb, unspecified: Secondary | ICD-10-CM | POA: Diagnosis not present

## 2012-01-13 DIAGNOSIS — I10 Essential (primary) hypertension: Secondary | ICD-10-CM | POA: Diagnosis not present

## 2012-01-13 DIAGNOSIS — I509 Heart failure, unspecified: Secondary | ICD-10-CM | POA: Diagnosis not present

## 2012-01-13 DIAGNOSIS — I872 Venous insufficiency (chronic) (peripheral): Secondary | ICD-10-CM | POA: Diagnosis not present

## 2012-01-13 DIAGNOSIS — L03119 Cellulitis of unspecified part of limb: Secondary | ICD-10-CM | POA: Diagnosis not present

## 2012-01-13 DIAGNOSIS — L97809 Non-pressure chronic ulcer of other part of unspecified lower leg with unspecified severity: Secondary | ICD-10-CM | POA: Diagnosis not present

## 2012-01-13 DIAGNOSIS — L02419 Cutaneous abscess of limb, unspecified: Secondary | ICD-10-CM | POA: Diagnosis not present

## 2012-01-14 DIAGNOSIS — S81809A Unspecified open wound, unspecified lower leg, initial encounter: Secondary | ICD-10-CM | POA: Diagnosis not present

## 2012-01-14 DIAGNOSIS — I872 Venous insufficiency (chronic) (peripheral): Secondary | ICD-10-CM | POA: Diagnosis not present

## 2012-01-14 DIAGNOSIS — L97909 Non-pressure chronic ulcer of unspecified part of unspecified lower leg with unspecified severity: Secondary | ICD-10-CM | POA: Diagnosis not present

## 2012-01-17 DIAGNOSIS — L97809 Non-pressure chronic ulcer of other part of unspecified lower leg with unspecified severity: Secondary | ICD-10-CM | POA: Diagnosis not present

## 2012-01-17 DIAGNOSIS — I10 Essential (primary) hypertension: Secondary | ICD-10-CM | POA: Diagnosis not present

## 2012-01-17 DIAGNOSIS — I872 Venous insufficiency (chronic) (peripheral): Secondary | ICD-10-CM | POA: Diagnosis not present

## 2012-01-17 DIAGNOSIS — L02419 Cutaneous abscess of limb, unspecified: Secondary | ICD-10-CM | POA: Diagnosis not present

## 2012-01-17 DIAGNOSIS — I509 Heart failure, unspecified: Secondary | ICD-10-CM | POA: Diagnosis not present

## 2012-01-17 NOTE — Progress Notes (Signed)
[facility]  NAME:  [name], [name]            ACCOUNT NO.:  [id number]  MEDICAL RECORD NO.:  [id number]      DATE OF BIRTH:  Sep 10, 1960  PHYSICIAN:  Wayland Denis, DO       VISIT DATE:  01/17/2012                                  OFFICE VISIT   The patient is a 51 year old female who is here for followup on her bilateral lower extremity ulcers.  She has been here previously and has bilateral lower extremity circumferential ulcers that have been chronic and she has had a very difficult time getting these to heal or even improved.  She has also been admitted for this as well.  Treatment plan was described on her last visit and she did not agree.  She wanted to go elsewhere, so she was sent to see Dr. Mardene Speak, and it is little unclear why she returned because she still does not want to do the treatment plan that was discussed.  She wants to lay out her own treatment plan, which I am not in agreement with.  She is not willing to do the dressing changes that we have talked about.  She wants to do ABD dressing changes every day by herself with no products and no other dressings.  After a lengthy discussion, it was agreed upon that she needs to find care at a facility that is willing to do her treatment plan or at least can get her to do their treatment plan and that does not seem to be happening here.  Therefore, she is being discharged from our clinic and recommended that she see somebody at [facility] or [place].  She has agreed to go to [facility].  I discussed with her that I think debridement is imperative that it will need to be staged.  I recommended A-Cell with VAC, she does not want to do that.  She wanted the skin graft, which is not going to take and I have zero confidence that, that will be successful, and therefore, I am not willing to do a skin graft only.  At one point, she said she might be willing to do debridement, but debridement alone is not going  to help her get these wounds healed. Therefore, I do believe it is in her best interest to seek care at either [facility] or [facility], and we will get her an appointment at [facility] with the [facility]. We are available to take care of any emergent needs that may arise over the next 30 days while she is waiting to get into [facility].     Wayland Denis, DO     CS/MEDQ  D:  01/17/2012  T:  01/17/2012  Job:  [id number]

## 2012-01-31 ENCOUNTER — Encounter (HOSPITAL_BASED_OUTPATIENT_CLINIC_OR_DEPARTMENT_OTHER): Payer: Medicare Other

## 2012-02-04 DIAGNOSIS — R609 Edema, unspecified: Secondary | ICD-10-CM | POA: Diagnosis not present

## 2012-02-04 DIAGNOSIS — L97909 Non-pressure chronic ulcer of unspecified part of unspecified lower leg with unspecified severity: Secondary | ICD-10-CM | POA: Diagnosis not present

## 2012-02-04 DIAGNOSIS — I890 Lymphedema, not elsewhere classified: Secondary | ICD-10-CM | POA: Diagnosis not present

## 2012-02-04 DIAGNOSIS — Z86718 Personal history of other venous thrombosis and embolism: Secondary | ICD-10-CM | POA: Diagnosis not present

## 2012-02-04 DIAGNOSIS — G7000 Myasthenia gravis without (acute) exacerbation: Secondary | ICD-10-CM | POA: Diagnosis not present

## 2012-02-04 DIAGNOSIS — E119 Type 2 diabetes mellitus without complications: Secondary | ICD-10-CM | POA: Diagnosis not present

## 2012-02-04 DIAGNOSIS — Z8619 Personal history of other infectious and parasitic diseases: Secondary | ICD-10-CM | POA: Diagnosis not present

## 2012-02-04 DIAGNOSIS — I872 Venous insufficiency (chronic) (peripheral): Secondary | ICD-10-CM | POA: Diagnosis not present

## 2012-02-04 DIAGNOSIS — L97209 Non-pressure chronic ulcer of unspecified calf with unspecified severity: Secondary | ICD-10-CM | POA: Diagnosis not present

## 2012-02-08 DIAGNOSIS — I10 Essential (primary) hypertension: Secondary | ICD-10-CM | POA: Diagnosis not present

## 2012-02-08 DIAGNOSIS — K219 Gastro-esophageal reflux disease without esophagitis: Secondary | ICD-10-CM | POA: Diagnosis not present

## 2012-02-08 DIAGNOSIS — J45909 Unspecified asthma, uncomplicated: Secondary | ICD-10-CM | POA: Diagnosis not present

## 2012-02-08 DIAGNOSIS — R000 Tachycardia, unspecified: Secondary | ICD-10-CM | POA: Diagnosis not present

## 2012-02-08 DIAGNOSIS — T8130XA Disruption of wound, unspecified, initial encounter: Secondary | ICD-10-CM | POA: Diagnosis not present

## 2012-02-08 DIAGNOSIS — R7301 Impaired fasting glucose: Secondary | ICD-10-CM | POA: Diagnosis not present

## 2012-02-08 DIAGNOSIS — G7000 Myasthenia gravis without (acute) exacerbation: Secondary | ICD-10-CM | POA: Diagnosis not present

## 2012-02-08 DIAGNOSIS — I517 Cardiomegaly: Secondary | ICD-10-CM | POA: Diagnosis not present

## 2012-02-08 DIAGNOSIS — T7840XA Allergy, unspecified, initial encounter: Secondary | ICD-10-CM | POA: Diagnosis not present

## 2012-02-08 DIAGNOSIS — G8929 Other chronic pain: Secondary | ICD-10-CM | POA: Diagnosis not present

## 2012-03-02 DIAGNOSIS — I10 Essential (primary) hypertension: Secondary | ICD-10-CM | POA: Diagnosis not present

## 2012-03-02 DIAGNOSIS — T7840XA Allergy, unspecified, initial encounter: Secondary | ICD-10-CM | POA: Diagnosis not present

## 2012-03-02 DIAGNOSIS — T8130XA Disruption of wound, unspecified, initial encounter: Secondary | ICD-10-CM | POA: Diagnosis not present

## 2012-03-02 DIAGNOSIS — J45909 Unspecified asthma, uncomplicated: Secondary | ICD-10-CM | POA: Diagnosis not present

## 2012-03-02 DIAGNOSIS — R000 Tachycardia, unspecified: Secondary | ICD-10-CM | POA: Diagnosis not present

## 2012-03-02 DIAGNOSIS — G7000 Myasthenia gravis without (acute) exacerbation: Secondary | ICD-10-CM | POA: Diagnosis not present

## 2012-03-02 DIAGNOSIS — K219 Gastro-esophageal reflux disease without esophagitis: Secondary | ICD-10-CM | POA: Diagnosis not present

## 2012-03-02 DIAGNOSIS — G8929 Other chronic pain: Secondary | ICD-10-CM | POA: Diagnosis not present

## 2012-03-03 DIAGNOSIS — L02419 Cutaneous abscess of limb, unspecified: Secondary | ICD-10-CM | POA: Diagnosis not present

## 2012-03-03 DIAGNOSIS — L97209 Non-pressure chronic ulcer of unspecified calf with unspecified severity: Secondary | ICD-10-CM | POA: Diagnosis not present

## 2012-03-03 DIAGNOSIS — I998 Other disorder of circulatory system: Secondary | ICD-10-CM | POA: Diagnosis not present

## 2012-03-03 DIAGNOSIS — I890 Lymphedema, not elsewhere classified: Secondary | ICD-10-CM | POA: Diagnosis not present

## 2012-04-03 DIAGNOSIS — Z86718 Personal history of other venous thrombosis and embolism: Secondary | ICD-10-CM | POA: Diagnosis not present

## 2012-04-03 DIAGNOSIS — I890 Lymphedema, not elsewhere classified: Secondary | ICD-10-CM | POA: Diagnosis not present

## 2012-04-03 DIAGNOSIS — L97209 Non-pressure chronic ulcer of unspecified calf with unspecified severity: Secondary | ICD-10-CM | POA: Diagnosis not present

## 2012-04-07 DIAGNOSIS — I517 Cardiomegaly: Secondary | ICD-10-CM | POA: Diagnosis not present

## 2012-04-07 DIAGNOSIS — I10 Essential (primary) hypertension: Secondary | ICD-10-CM | POA: Diagnosis not present

## 2012-05-05 DIAGNOSIS — M79609 Pain in unspecified limb: Secondary | ICD-10-CM | POA: Diagnosis not present

## 2012-05-29 DIAGNOSIS — G7000 Myasthenia gravis without (acute) exacerbation: Secondary | ICD-10-CM | POA: Diagnosis not present

## 2012-05-29 DIAGNOSIS — T7840XA Allergy, unspecified, initial encounter: Secondary | ICD-10-CM | POA: Diagnosis not present

## 2012-05-29 DIAGNOSIS — G8929 Other chronic pain: Secondary | ICD-10-CM | POA: Diagnosis not present

## 2012-05-29 DIAGNOSIS — T8130XA Disruption of wound, unspecified, initial encounter: Secondary | ICD-10-CM | POA: Diagnosis not present

## 2012-05-29 DIAGNOSIS — J45909 Unspecified asthma, uncomplicated: Secondary | ICD-10-CM | POA: Diagnosis not present

## 2012-05-29 DIAGNOSIS — R000 Tachycardia, unspecified: Secondary | ICD-10-CM | POA: Diagnosis not present

## 2012-05-29 DIAGNOSIS — I10 Essential (primary) hypertension: Secondary | ICD-10-CM | POA: Diagnosis not present

## 2012-05-29 DIAGNOSIS — K219 Gastro-esophageal reflux disease without esophagitis: Secondary | ICD-10-CM | POA: Diagnosis not present

## 2012-06-02 DIAGNOSIS — L02619 Cutaneous abscess of unspecified foot: Secondary | ICD-10-CM | POA: Diagnosis not present

## 2012-06-02 DIAGNOSIS — I509 Heart failure, unspecified: Secondary | ICD-10-CM | POA: Diagnosis not present

## 2012-06-02 DIAGNOSIS — N189 Chronic kidney disease, unspecified: Secondary | ICD-10-CM | POA: Diagnosis present

## 2012-06-02 DIAGNOSIS — L03119 Cellulitis of unspecified part of limb: Secondary | ICD-10-CM | POA: Diagnosis not present

## 2012-06-02 DIAGNOSIS — I10 Essential (primary) hypertension: Secondary | ICD-10-CM | POA: Diagnosis not present

## 2012-06-02 DIAGNOSIS — I129 Hypertensive chronic kidney disease with stage 1 through stage 4 chronic kidney disease, or unspecified chronic kidney disease: Secondary | ICD-10-CM | POA: Diagnosis present

## 2012-06-02 DIAGNOSIS — L97209 Non-pressure chronic ulcer of unspecified calf with unspecified severity: Secondary | ICD-10-CM | POA: Diagnosis not present

## 2012-06-02 DIAGNOSIS — Z86718 Personal history of other venous thrombosis and embolism: Secondary | ICD-10-CM | POA: Diagnosis not present

## 2012-06-02 DIAGNOSIS — G7000 Myasthenia gravis without (acute) exacerbation: Secondary | ICD-10-CM | POA: Diagnosis present

## 2012-06-02 DIAGNOSIS — L97809 Non-pressure chronic ulcer of other part of unspecified lower leg with unspecified severity: Secondary | ICD-10-CM | POA: Diagnosis not present

## 2012-06-02 DIAGNOSIS — M129 Arthropathy, unspecified: Secondary | ICD-10-CM | POA: Diagnosis not present

## 2012-06-02 DIAGNOSIS — N179 Acute kidney failure, unspecified: Secondary | ICD-10-CM | POA: Diagnosis not present

## 2012-06-02 DIAGNOSIS — L97909 Non-pressure chronic ulcer of unspecified part of unspecified lower leg with unspecified severity: Secondary | ICD-10-CM | POA: Diagnosis not present

## 2012-06-02 DIAGNOSIS — E876 Hypokalemia: Secondary | ICD-10-CM | POA: Diagnosis present

## 2012-06-02 DIAGNOSIS — J45909 Unspecified asthma, uncomplicated: Secondary | ICD-10-CM | POA: Diagnosis present

## 2012-06-02 DIAGNOSIS — L0390 Cellulitis, unspecified: Secondary | ICD-10-CM | POA: Diagnosis not present

## 2012-06-02 DIAGNOSIS — I890 Lymphedema, not elsewhere classified: Secondary | ICD-10-CM | POA: Diagnosis not present

## 2012-06-02 DIAGNOSIS — I872 Venous insufficiency (chronic) (peripheral): Secondary | ICD-10-CM | POA: Diagnosis present

## 2012-06-02 DIAGNOSIS — M79609 Pain in unspecified limb: Secondary | ICD-10-CM | POA: Diagnosis not present

## 2012-06-02 DIAGNOSIS — R609 Edema, unspecified: Secondary | ICD-10-CM | POA: Diagnosis not present

## 2012-06-02 DIAGNOSIS — M7989 Other specified soft tissue disorders: Secondary | ICD-10-CM | POA: Diagnosis not present

## 2012-06-02 DIAGNOSIS — L0291 Cutaneous abscess, unspecified: Secondary | ICD-10-CM | POA: Diagnosis not present

## 2012-06-02 DIAGNOSIS — L02419 Cutaneous abscess of limb, unspecified: Secondary | ICD-10-CM | POA: Diagnosis not present

## 2012-07-12 DIAGNOSIS — I872 Venous insufficiency (chronic) (peripheral): Secondary | ICD-10-CM | POA: Diagnosis not present

## 2012-07-12 DIAGNOSIS — L97909 Non-pressure chronic ulcer of unspecified part of unspecified lower leg with unspecified severity: Secondary | ICD-10-CM | POA: Diagnosis not present

## 2012-07-12 DIAGNOSIS — I83009 Varicose veins of unspecified lower extremity with ulcer of unspecified site: Secondary | ICD-10-CM | POA: Diagnosis not present

## 2012-08-01 DIAGNOSIS — I10 Essential (primary) hypertension: Secondary | ICD-10-CM | POA: Diagnosis not present

## 2012-08-01 DIAGNOSIS — K219 Gastro-esophageal reflux disease without esophagitis: Secondary | ICD-10-CM | POA: Diagnosis not present

## 2012-08-01 DIAGNOSIS — G7000 Myasthenia gravis without (acute) exacerbation: Secondary | ICD-10-CM | POA: Diagnosis not present

## 2012-08-01 DIAGNOSIS — I872 Venous insufficiency (chronic) (peripheral): Secondary | ICD-10-CM | POA: Diagnosis not present

## 2012-08-01 DIAGNOSIS — L97209 Non-pressure chronic ulcer of unspecified calf with unspecified severity: Secondary | ICD-10-CM | POA: Diagnosis not present

## 2012-08-01 DIAGNOSIS — Z01818 Encounter for other preprocedural examination: Secondary | ICD-10-CM | POA: Diagnosis not present

## 2012-08-01 DIAGNOSIS — I972 Postmastectomy lymphedema syndrome: Secondary | ICD-10-CM | POA: Diagnosis not present

## 2012-08-01 DIAGNOSIS — I83009 Varicose veins of unspecified lower extremity with ulcer of unspecified site: Secondary | ICD-10-CM | POA: Diagnosis not present

## 2012-08-01 DIAGNOSIS — L97909 Non-pressure chronic ulcer of unspecified part of unspecified lower leg with unspecified severity: Secondary | ICD-10-CM | POA: Diagnosis not present

## 2012-08-01 DIAGNOSIS — J45909 Unspecified asthma, uncomplicated: Secondary | ICD-10-CM | POA: Diagnosis not present

## 2012-08-17 DIAGNOSIS — Z86718 Personal history of other venous thrombosis and embolism: Secondary | ICD-10-CM | POA: Diagnosis not present

## 2012-08-17 DIAGNOSIS — Z79899 Other long term (current) drug therapy: Secondary | ICD-10-CM | POA: Diagnosis not present

## 2012-08-17 DIAGNOSIS — I509 Heart failure, unspecified: Secondary | ICD-10-CM | POA: Diagnosis not present

## 2012-08-17 DIAGNOSIS — H547 Unspecified visual loss: Secondary | ICD-10-CM | POA: Diagnosis not present

## 2012-08-17 DIAGNOSIS — G7000 Myasthenia gravis without (acute) exacerbation: Secondary | ICD-10-CM | POA: Diagnosis not present

## 2012-08-17 DIAGNOSIS — R011 Cardiac murmur, unspecified: Secondary | ICD-10-CM | POA: Diagnosis not present

## 2012-08-17 DIAGNOSIS — I10 Essential (primary) hypertension: Secondary | ICD-10-CM | POA: Diagnosis not present

## 2012-08-17 DIAGNOSIS — I872 Venous insufficiency (chronic) (peripheral): Secondary | ICD-10-CM | POA: Diagnosis not present

## 2012-08-17 DIAGNOSIS — L97909 Non-pressure chronic ulcer of unspecified part of unspecified lower leg with unspecified severity: Secondary | ICD-10-CM | POA: Diagnosis not present

## 2012-08-17 DIAGNOSIS — D569 Thalassemia, unspecified: Secondary | ICD-10-CM | POA: Diagnosis not present

## 2012-08-17 DIAGNOSIS — D259 Leiomyoma of uterus, unspecified: Secondary | ICD-10-CM | POA: Diagnosis not present

## 2012-08-17 DIAGNOSIS — I83009 Varicose veins of unspecified lower extremity with ulcer of unspecified site: Secondary | ICD-10-CM | POA: Diagnosis not present

## 2012-08-17 DIAGNOSIS — J45909 Unspecified asthma, uncomplicated: Secondary | ICD-10-CM | POA: Diagnosis not present

## 2012-08-18 DIAGNOSIS — I872 Venous insufficiency (chronic) (peripheral): Secondary | ICD-10-CM | POA: Diagnosis not present

## 2012-08-18 DIAGNOSIS — I83009 Varicose veins of unspecified lower extremity with ulcer of unspecified site: Secondary | ICD-10-CM | POA: Diagnosis not present

## 2012-08-18 DIAGNOSIS — L97909 Non-pressure chronic ulcer of unspecified part of unspecified lower leg with unspecified severity: Secondary | ICD-10-CM | POA: Diagnosis not present

## 2012-08-18 DIAGNOSIS — J45909 Unspecified asthma, uncomplicated: Secondary | ICD-10-CM | POA: Diagnosis not present

## 2012-08-18 DIAGNOSIS — I509 Heart failure, unspecified: Secondary | ICD-10-CM | POA: Diagnosis not present

## 2012-08-18 DIAGNOSIS — G7000 Myasthenia gravis without (acute) exacerbation: Secondary | ICD-10-CM | POA: Diagnosis not present

## 2012-08-18 DIAGNOSIS — I10 Essential (primary) hypertension: Secondary | ICD-10-CM | POA: Diagnosis not present

## 2012-08-20 DIAGNOSIS — I10 Essential (primary) hypertension: Secondary | ICD-10-CM | POA: Diagnosis not present

## 2012-08-20 DIAGNOSIS — I872 Venous insufficiency (chronic) (peripheral): Secondary | ICD-10-CM | POA: Diagnosis not present

## 2012-08-20 DIAGNOSIS — L97209 Non-pressure chronic ulcer of unspecified calf with unspecified severity: Secondary | ICD-10-CM | POA: Diagnosis not present

## 2012-08-20 DIAGNOSIS — G7000 Myasthenia gravis without (acute) exacerbation: Secondary | ICD-10-CM | POA: Diagnosis not present

## 2012-08-20 DIAGNOSIS — M069 Rheumatoid arthritis, unspecified: Secondary | ICD-10-CM | POA: Diagnosis not present

## 2012-08-21 DIAGNOSIS — M069 Rheumatoid arthritis, unspecified: Secondary | ICD-10-CM | POA: Diagnosis not present

## 2012-08-21 DIAGNOSIS — G7000 Myasthenia gravis without (acute) exacerbation: Secondary | ICD-10-CM | POA: Diagnosis not present

## 2012-08-21 DIAGNOSIS — L97209 Non-pressure chronic ulcer of unspecified calf with unspecified severity: Secondary | ICD-10-CM | POA: Diagnosis not present

## 2012-08-21 DIAGNOSIS — I10 Essential (primary) hypertension: Secondary | ICD-10-CM | POA: Diagnosis not present

## 2012-08-21 DIAGNOSIS — I872 Venous insufficiency (chronic) (peripheral): Secondary | ICD-10-CM | POA: Diagnosis not present

## 2012-08-23 DIAGNOSIS — I872 Venous insufficiency (chronic) (peripheral): Secondary | ICD-10-CM | POA: Diagnosis not present

## 2012-08-23 DIAGNOSIS — I10 Essential (primary) hypertension: Secondary | ICD-10-CM | POA: Diagnosis not present

## 2012-08-23 DIAGNOSIS — G7000 Myasthenia gravis without (acute) exacerbation: Secondary | ICD-10-CM | POA: Diagnosis not present

## 2012-08-23 DIAGNOSIS — L97209 Non-pressure chronic ulcer of unspecified calf with unspecified severity: Secondary | ICD-10-CM | POA: Diagnosis not present

## 2012-08-23 DIAGNOSIS — M069 Rheumatoid arthritis, unspecified: Secondary | ICD-10-CM | POA: Diagnosis not present

## 2012-08-25 ENCOUNTER — Telehealth: Payer: Self-pay | Admitting: Neurology

## 2012-08-25 ENCOUNTER — Encounter: Payer: Self-pay | Admitting: *Deleted

## 2012-08-25 NOTE — Telephone Encounter (Signed)
Got labs from [facility] and will put in mail on Monday.

## 2012-08-28 DIAGNOSIS — M069 Rheumatoid arthritis, unspecified: Secondary | ICD-10-CM | POA: Diagnosis not present

## 2012-08-28 DIAGNOSIS — L97209 Non-pressure chronic ulcer of unspecified calf with unspecified severity: Secondary | ICD-10-CM | POA: Diagnosis not present

## 2012-08-28 DIAGNOSIS — I10 Essential (primary) hypertension: Secondary | ICD-10-CM | POA: Diagnosis not present

## 2012-08-28 DIAGNOSIS — G7000 Myasthenia gravis without (acute) exacerbation: Secondary | ICD-10-CM | POA: Diagnosis not present

## 2012-08-28 DIAGNOSIS — I872 Venous insufficiency (chronic) (peripheral): Secondary | ICD-10-CM | POA: Diagnosis not present

## 2012-08-30 DIAGNOSIS — L97209 Non-pressure chronic ulcer of unspecified calf with unspecified severity: Secondary | ICD-10-CM | POA: Diagnosis not present

## 2012-08-30 DIAGNOSIS — I10 Essential (primary) hypertension: Secondary | ICD-10-CM | POA: Diagnosis not present

## 2012-08-30 DIAGNOSIS — I872 Venous insufficiency (chronic) (peripheral): Secondary | ICD-10-CM | POA: Diagnosis not present

## 2012-08-30 DIAGNOSIS — G7000 Myasthenia gravis without (acute) exacerbation: Secondary | ICD-10-CM | POA: Diagnosis not present

## 2012-08-30 DIAGNOSIS — M069 Rheumatoid arthritis, unspecified: Secondary | ICD-10-CM | POA: Diagnosis not present

## 2013-02-02 ENCOUNTER — Encounter (HOSPITAL_COMMUNITY): Payer: Self-pay | Admitting: Emergency Medicine

## 2013-02-02 ENCOUNTER — Emergency Department (INDEPENDENT_AMBULATORY_CARE_PROVIDER_SITE_OTHER)
Admission: EM | Admit: 2013-02-02 | Discharge: 2013-02-02 | Disposition: A | Discharge status: Home / Self Care | Payer: Medicare Other | Source: Home / Self Care | Attending: Family Medicine | Admitting: Family Medicine

## 2013-02-02 DIAGNOSIS — I10 Essential (primary) hypertension: Secondary | ICD-10-CM | POA: Diagnosis not present

## 2013-02-02 LAB — POCT I-STAT, CHEM 8
BUN: 16 mg/dL (ref 6–23)
CREATININE: 1.3 mg/dL — AB (ref 0.50–1.10)
Calcium, Ion: 1.23 mmol/L (ref 1.12–1.23)
Chloride: 110 mEq/L (ref 96–112)
Glucose, Bld: 95 mg/dL (ref 70–99)
HCT: 31 % — ABNORMAL LOW (ref 36.0–46.0)
HEMOGLOBIN: 10.5 g/dL — AB (ref 12.0–15.0)
Potassium: 3.3 mEq/L — ABNORMAL LOW (ref 3.7–5.3)
SODIUM: 144 meq/L (ref 137–147)
TCO2: 22 mmol/L (ref 0–100)

## 2013-02-02 MED ORDER — LISINOPRIL-HYDROCHLOROTHIAZIDE 20-12.5 MG PO TABS
1.0000 | ORAL_TABLET | Freq: Every day | ORAL | Status: DC
Start: 2013-02-02 — End: 2013-08-30

## 2013-02-02 NOTE — ED Notes (Signed)
Pt requesting refill on BP medication Triometerene HCTZ 75/50.  Pt states that she has been with out meds for 2 to 3 weeks.  States in between doctors at  This time.  Having a slight headache.  Denies any other concerns.

## 2013-02-02 NOTE — ED Provider Notes (Signed)
CSN: [id number]     Arrival date & time 02/02/13  9381 History   First MD Initiated Contact with Patient 02/02/13 [phone]     Chief Complaint  Patient presents with  . Medication Refill   (Consider location/radiation/quality/duration/timing/severity/associated sxs/prior Treatment) Patient is a 53 y.o. female presenting with hypertension. The history is provided by the patient.  Hypertension This is a chronic problem. The current episode started more than 1 week ago (out of bp med and in between doctors, here for refills.). The problem has not changed since onset.Associated symptoms include headaches. Pertinent negatives include no chest pain, no abdominal pain and no shortness of breath.    Past Medical History  Diagnosis Date  . Asthma   . Hypertension   . CHF (congestive heart failure)   . Myasthenia gravis   . Heart murmur   . Venous stasis    Past Surgical History  Procedure Laterality Date  . Tonsillectomy    . Myomectomy     History reviewed. No pertinent family history. History  Substance Use Topics  . Smoking status: Never Smoker   . Smokeless tobacco: Not on file  . Alcohol Use: No   OB History   Grav Para Term Preterm Abortions TAB SAB Ect Mult Living                 Review of Systems  Constitutional: Negative.   Respiratory: Negative for chest tightness and shortness of breath.   Cardiovascular: Positive for leg swelling. Negative for chest pain and palpitations.  Gastrointestinal: Negative.  Negative for abdominal pain.  Neurological: Positive for headaches.    Allergies  Asa; Dilaudid; Erythromycin; Indomethacin; Naproxen; Alginate-collagen; Gold-containing drug products; Silvadene; and Tylenol  Home Medications   Current Outpatient Rx  Name  Route  Sig  Dispense  Refill  . COD LIVER OIL PO   Oral   Take 2 capsules by mouth daily.         . Garlic Oil 1025 MG CAPS   Oral   Take 1,000 mg by mouth daily.         . Multiple Vitamin (MULTIVITAMIN  WITH MINERALS) TABS   Oral   Take 1 tablet by mouth daily. 1 gummy         . Omega-3 Fatty Acids (FISH OIL) 500 MG CAPS   Oral   Take 1,000 mg by mouth daily.         . vitamin E 100 UNIT capsule   Oral   Take 100 Units by mouth daily.         Marland Kitchen albuterol (PROVENTIL HFA;VENTOLIN HFA) 108 (90 BASE) MCG/ACT inhaler   Inhalation   Inhale 4 puffs into the lungs every 6 (six) hours as needed. For shortness of breath         . ciprofloxacin (CIPRO) 750 MG tablet   Oral   Take 1 tablet (750 mg total) by mouth 2 (two) times daily.   14 tablet   0   . diphenhydrAMINE (BENADRYL) 25 MG tablet   Oral   Take 50 mg by mouth 4 (four) times daily.         . Emollient (NIVEA) cream   Topical   Apply 1 application topically daily. Apply all over body         . EPINEPHrine (EPIPEN) 0.3 mg/0.3 mL DEVI   Intramuscular   Inject 0.3 mg into the muscle as needed. For allergic reaction         .  ferrous sulfate 325 (65 FE) MG tablet   Oral   Take 325-650 mg by mouth daily with breakfast.         . ibuprofen (ADVIL,MOTRIN) 200 MG tablet   Oral   Take 2,000 mg by mouth 4 (four) times daily. Takes 10 tablets to get 2000mg          . lisinopril-hydrochlorothiazide (PRINZIDE,ZESTORETIC) 20-12.5 MG per tablet   Oral   Take 1 tablet by mouth daily.   30 tablet   1   . oxyCODONE (OXY IR/ROXICODONE) 5 MG immediate release tablet   Oral   Take 1-2 tablets (5-10 mg total) by mouth every 4 (four) hours as needed.   30 tablet   0   . oxyCODONE-acetaminophen (PERCOCET/ROXICET) 5-325 MG per tablet   Oral   Take 1 tablet by mouth every 4 (four) hours as needed for pain.   30 tablet   0   . pyridostigmine (MESTINON) 60 MG tablet   Oral   Take 60 mg by mouth 4 (four) times daily.         Marland Kitchen pyridostigmine (MESTINON) 60 MG tablet   Oral   Take 1 tablet (60 mg total) by mouth 4 (four) times daily.   60 tablet   1    BP 143/81  Pulse 90  Temp(Src) 97.5 F (36.4 C) (Oral)   Resp 22  SpO2 97%  LMP 07/12/2011 Physical Exam  Nursing note and vitals reviewed. Constitutional: She is oriented to person, place, and time. She appears well-developed and well-nourished.  Neck: Normal range of motion. Neck supple.  Cardiovascular: Normal rate, regular rhythm, normal heart sounds and intact distal pulses.   Pulmonary/Chest: Effort normal and breath sounds normal.  Neurological: She is alert and oriented to person, place, and time.    ED Course  Procedures (including critical care time) Labs Review Labs Reviewed  POCT I-STAT, CHEM 8 - Abnormal; Notable for the following:    Potassium 3.3 (*)    Creatinine, Ser 1.30 (*)    Hemoglobin 10.5 (*)    HCT 31.0 (*)    All other components within normal limits   Imaging Review No results found.  EKG Interpretation    Date/Time:    Ventricular Rate:    PR Interval:    QRS Duration:   QT Interval:    QTC Calculation:   R Axis:     Text Interpretation:              MDM  i-stat --see results    Billy Fischer, MD 02/02/13 1133

## 2013-02-02 NOTE — Discharge Instructions (Signed)
tka e medicine daily and see your doctor for refills

## 2013-02-02 NOTE — ED Notes (Signed)
Waiting discharge papers 

## 2013-04-03 ENCOUNTER — Other Ambulatory Visit: Payer: Self-pay | Admitting: Neurology

## 2013-04-03 NOTE — Telephone Encounter (Signed)
Patient has appt in August.  

## 2013-07-16 ENCOUNTER — Ambulatory Visit: Payer: Medicare Other | Admitting: Internal Medicine

## 2013-07-26 ENCOUNTER — Ambulatory Visit: Payer: Medicare Other | Admitting: Family Medicine

## 2013-08-28 ENCOUNTER — Encounter: Payer: Self-pay | Admitting: Neurology

## 2013-08-28 ENCOUNTER — Ambulatory Visit: Payer: Self-pay | Admitting: Neurology

## 2013-08-30 ENCOUNTER — Ambulatory Visit (INDEPENDENT_AMBULATORY_CARE_PROVIDER_SITE_OTHER): Payer: Medicare Other | Admitting: Internal Medicine

## 2013-08-30 VITALS — BP 121/84 | HR 103 | Temp 98.0°F | Resp 18 | Ht 67.0 in | Wt 197.0 lb

## 2013-08-30 DIAGNOSIS — M069 Rheumatoid arthritis, unspecified: Secondary | ICD-10-CM | POA: Diagnosis not present

## 2013-08-30 DIAGNOSIS — I10 Essential (primary) hypertension: Secondary | ICD-10-CM | POA: Diagnosis not present

## 2013-08-30 DIAGNOSIS — I872 Venous insufficiency (chronic) (peripheral): Secondary | ICD-10-CM | POA: Diagnosis not present

## 2013-08-30 DIAGNOSIS — L97909 Non-pressure chronic ulcer of unspecified part of unspecified lower leg with unspecified severity: Secondary | ICD-10-CM | POA: Diagnosis not present

## 2013-08-30 DIAGNOSIS — R0989 Other specified symptoms and signs involving the circulatory and respiratory systems: Secondary | ICD-10-CM

## 2013-08-30 DIAGNOSIS — I878 Other specified disorders of veins: Secondary | ICD-10-CM

## 2013-08-30 DIAGNOSIS — G7000 Myasthenia gravis without (acute) exacerbation: Secondary | ICD-10-CM

## 2013-08-30 DIAGNOSIS — L97929 Non-pressure chronic ulcer of unspecified part of left lower leg with unspecified severity: Secondary | ICD-10-CM

## 2013-08-30 DIAGNOSIS — R0609 Other forms of dyspnea: Secondary | ICD-10-CM

## 2013-08-30 DIAGNOSIS — R0600 Dyspnea, unspecified: Secondary | ICD-10-CM

## 2013-08-30 DIAGNOSIS — L97919 Non-pressure chronic ulcer of unspecified part of right lower leg with unspecified severity: Secondary | ICD-10-CM

## 2013-08-30 MED ORDER — OXYCODONE HCL 5 MG PO TABS: 5.0000 mg | ORAL_TABLET | ORAL | Status: DC | PRN

## 2013-08-30 MED ORDER — LISINOPRIL 10 MG PO TABS: 10.0000 mg | ORAL_TABLET | Freq: Every day | ORAL | Status: DC

## 2013-08-30 NOTE — Progress Notes (Signed)
Patient ID: [name], female   DOB: 1960-10-21, 53 y.o.   MRN: [id number]   [name], is a 53 y.o. female  EUM:[id number]  [id number]:[id number]  DOB - 1960/08/31  CC:  Chief Complaint  Patient presents with  . Establish Care       HPI: [name] is a 53 y.o. female here today to establish medical care. She has a H/O Myasthenia Gravis and is managed by [facility]. She reports that she has not had a flare up of her disease in many years.   She has not been seen by a Primary Care in many years, He has extensive ulcers on both legs but has not returned to the wound clinic as she wants to dictate her care to the Physician and states that she just doesn't want to feel like an experiment with new treatments. I've had a long discussion with patient about the fact that newer treatments may in fact be more beneficial and she is willing to go back to the wound center to seek treatment.  Patient has No headache, No chest pain, No abdominal pain - No Nausea, No new weakness tingling or numbness, No Cough - SOB.  Allergies  Allergen Reactions  . Asa [Aspirin] Shortness Of Breath and Rash  . Dilaudid [Hydromorphone Hcl] Shortness Of Breath and Palpitations  . Erythromycin Other (See Comments)    Burn veins  . Indomethacin Other (See Comments)    Stroke symptoms  . Naproxen Other (See Comments)    Renal failure  . Alginate-Collagen [Wound Dressings] Rash  . Gold-Containing Drug Products Rash  . Silvadene [Silver Sulfadiazine] Rash  . Tylenol [Acetaminophen] Rash and Other (See Comments)    Salivation and hearing loss   Past Medical History  Diagnosis Date  . Asthma   . Hypertension   . CHF (congestive heart failure)   . Myasthenia gravis   . Heart murmur   . Venous stasis    Current Outpatient Prescriptions on File Prior to Visit  Medication Sig Dispense Refill  . albuterol (PROVENTIL HFA;VENTOLIN HFA) 108 (90 BASE) MCG/ACT inhaler Inhale 4 puffs into the lungs  every 6 (six) hours as needed. For shortness of breath      . COD LIVER OIL PO Take 2 capsules by mouth daily.      . diphenhydrAMINE (BENADRYL) 25 MG tablet Take 50 mg by mouth 4 (four) times daily.      . Emollient (NIVEA) cream Apply 1 application topically daily. Apply all over body      . EPINEPHrine (EPIPEN) 0.3 mg/0.3 mL DEVI Inject 0.3 mg into the muscle as needed. For allergic reaction      . ferrous sulfate 325 (65 FE) MG tablet Take 325-650 mg by mouth daily with breakfast.      . Garlic Oil 9326 MG CAPS Take 1,000 mg by mouth daily.      Marland Kitchen ibuprofen (ADVIL,MOTRIN) 200 MG tablet Take 2,000 mg by mouth 4 (four) times daily. Takes 10 tablets to get 2000mg       . lidocaine (XYLOCAINE) 4 % external solution       . Multiple Vitamin (MULTIVITAMIN WITH MINERALS) TABS Take 1 tablet by mouth daily. 1 gummy      . Omega-3 Fatty Acids (FISH OIL) 500 MG CAPS Take 1,000 mg by mouth daily.      Marland Kitchen pyridostigmine (MESTINON) 60 MG tablet TAKE ONE TABLET BY MOUTH 4 TIMES DAILY  120 tablet  5  . vitamin E 100 UNIT  capsule Take 100 Units by mouth daily.       No current facility-administered medications on file prior to visit.   Family History  Problem Relation Age of Onset  . Depression    . Cancer     History   Social History  . Marital Status: Single    Spouse Name: N/A    Number of Children: N/A  . Years of Education: college   Occupational History  .      Disabled   Social History Main Topics  . Smoking status: Never Smoker   . Smokeless tobacco: Never Used  . Alcohol Use: No  . Drug Use: No  . Sexual Activity: No   Other Topics Concern  . Not on file   Social History Narrative   Patient lives at home alone.   Disabled.   Education college.   Right handed.   Caffeine patient drinks coffee.    Review of Systems: Constitutional: Negative for fever, chills, diaphoresis, activity change, appetite change and fatigue. HENT: Negative for ear pain, nosebleeds, congestion,  facial swelling, rhinorrhea, neck pain, neck stiffness and ear discharge.  Eyes: Negative for pain, discharge, redness, itching and visual disturbance. Respiratory: Negative for cough, choking, chest tightness, shortness of breath, wheezing and stridor.  Cardiovascular: Negative for chest pain, palpitations and leg swelling. Gastrointestinal: Negative for abdominal distention. Genitourinary: Negative for dysuria, urgency, frequency, hematuria, flank pain, decreased urine volume, difficulty urinating and dyspareunia.  Musculoskeletal: Negative for back pain, joint swelling, arthralgia and gait problem. Neurological: Negative for dizziness, tremors, seizures, syncope, facial asymmetry, speech difficulty, weakness, light-headedness, numbness and headaches.  Hematological: Negative for adenopathy. Does not bruise/bleed easily. Psychiatric/Behavioral: Negative for hallucinations, behavioral problems, confusion, dysphoric mood, decreased concentration and agitation.    Objective:   Filed Vitals:   08/30/13 1340  BP: 121/84  Pulse: 103  Temp: 98 F (36.7 C)  Resp: 18    Physical Exam: Constitutional: Patient appears well-developed and well-nourished. No distress. HENT: Normocephalic, atraumatic, External right and left ear normal. Oropharynx is clear and moist.  Eyes: Conjunctivae and EOM are normal. PERRLA, no scleral icterus. Neck: Normal ROM. Neck supple. No JVD. No tracheal deviation. No thyromegaly. CVS: RRR, S1/S2 +, no murmurs, no gallops, no carotid bruit.  Pulmonary: Effort and breath sounds normal, no stridor, rhonchi, wheezes, rales.  Abdominal: Soft. BS +, no distension, tenderness, rebound or guarding.  Musculoskeletal: Normal range of motion. No edema and no tenderness.  Lymphadenopathy: No lymphadenopathy noted, cervical, inguinal or axillary Neuro: Alert. Normal reflexes, muscle tone coordination. No cranial nerve deficit. Skin: Pt has extensive non-staegable wounds on both  legs. The wounds cover most of the anterior and lateral surfaces of the legs. There are some areas of dehydrated necrotic tissue and slough interspersed with granulated tissue in the wound bed. The wound is dressed with sanitary napkins and a trash bag.  Psychiatric: Normal mood and with a very animated affect. Behavior, judgment, thought content normal.  Lab Results  Component Value Date   WBC 6.9 11/12/2011   HGB 10.5* 02/02/2013   HCT 31.0* 02/02/2013   MCV 71.7* 11/12/2011   PLT 395 11/12/2011   Lab Results  Component Value Date   CREATININE 1.30* 02/02/2013   BUN 16 02/02/2013   NA 144 02/02/2013   K 3.3* 02/02/2013   CL 110 02/02/2013   CO2 27 11/14/2011    No results found for this basename: HGBA1C   Lipid Panel     Component Value Date/Time  CHOL  Value: 75        ATP III CLASSIFICATION:  <200     mg/dL   Desirable  200-239  mg/dL   Borderline High  >=240    mg/dL   High        08/03/2008 1105   TRIG 110 08/03/2008 1105   HDL 38* 08/03/2008 1105   CHOLHDL 2.0 08/03/2008 1105   VLDL 22 08/03/2008 1105   LDLCALC  Value: 15        Total Cholesterol/HDL:CHD Risk Coronary Heart Disease Risk Table                     Men   Women  1/2 Average Risk   3.4   3.3  Average Risk       5.0   4.4  2 X Average Risk   9.6   7.1  3 X Average Risk  23.4   11.0        Use the calculated Patient Ratio above and the CHD Risk Table to determine the patient's CHD Risk.        ATP III CLASSIFICATION (LDL):  <100     mg/dL   Optimal  100-129  mg/dL   Near or Above                    Optimal  130-159  mg/dL   Borderline  160-189  mg/dL   High  >190     mg/dL   Very High 08/03/2008 1105       Assessment and plan:   1. Rheumatoid arthritis - Pt has some deformities of the hand consistent with RA. She has not been followed by a Rheumatologist. Will hold on obtaining any labs except a CCP to confirm diagnosis as we have no documentation. - Ambulatory referral to Rheumatology - Comprehensive metabolic panel  2.  Essential hypertension - Pt has a normal BP after being without her Lisinopril 40/HCTZ 25 for more than a week. So will discontinue HCTZ and decrease dose of Lisinopril to 10 mg.  -Comprehensive metabolic panel  - lisinopril (PRINIVIL,ZESTRIL) 10 MG tablet; Take 1 tablet (10 mg total) by mouth daily.  Dispense: 90 tablet; Refill: 3  3. Ulcers of both lower legs - Wounds do appear infected. Will refer back to wound clinic. Wounds dressed with dry dressing. Will consider home care if patient's appointment is delayed. - oxyCODONE (OXY IR/ROXICODONE) 5 MG immediate release tablet; Take 1-2 tablets (5-10 mg total) by mouth every 4 (four) hours as needed.  Dispense: 30 tablet; Refill: 0 - AMB referral to wound care center - CBC with Differential - Comprehensive metabolic panel - Magnesium  4. Myasthenia gravis - Continue care under Neurology - Ambulatory referral to Ophthalmology  5. Dyspnea - Will obtain ECHO in light of Dyspnea and BLE edema.    Return in about 2 months (around 10/30/2013) for leg ulcers, Review of lab data, HTN.  The patient was given clear instructions to go to ER or return to medical center if symptoms don't improve, worsen or new problems develop. The patient verbalized understanding. The patient was told to call to get lab results if they haven't heard anything in the next week.     This note has been created with Surveyor, quantity. Any transcriptional errors are unintentional.    Tee Richeson A., MD [place], [place]   08/30/2013, 4:10 PM

## 2013-08-31 ENCOUNTER — Encounter: Payer: Self-pay | Admitting: Internal Medicine

## 2013-08-31 LAB — CBC WITH DIFFERENTIAL/PLATELET
BASOS ABS: 0 10*3/uL (ref 0.0–0.1)
BASOS PCT: 0 % (ref 0–1)
EOS ABS: 0.1 10*3/uL (ref 0.0–0.7)
Eosinophils Relative: 2 % (ref 0–5)
HCT: 35 % — ABNORMAL LOW (ref 36.0–46.0)
Hemoglobin: 11.5 g/dL — ABNORMAL LOW (ref 12.0–15.0)
LYMPHS ABS: 1 10*3/uL (ref 0.7–4.0)
Lymphocytes Relative: 15 % (ref 12–46)
MCH: 22.6 pg — AB (ref 26.0–34.0)
MCHC: 32.9 g/dL (ref 30.0–36.0)
MCV: 68.8 fL — ABNORMAL LOW (ref 78.0–100.0)
Monocytes Absolute: 0.9 10*3/uL (ref 0.1–1.0)
Monocytes Relative: 13 % — ABNORMAL HIGH (ref 3–12)
NEUTROS PCT: 70 % (ref 43–77)
Neutro Abs: 4.8 10*3/uL (ref 1.7–7.7)
PLATELETS: 367 10*3/uL (ref 150–400)
RBC: 5.09 MIL/uL (ref 3.87–5.11)
RDW: 19.6 % — AB (ref 11.5–15.5)
WBC: 6.9 10*3/uL (ref 4.0–10.5)

## 2013-08-31 LAB — MAGNESIUM: Magnesium: 2.4 mg/dL (ref 1.5–2.5)

## 2013-08-31 LAB — COMPREHENSIVE METABOLIC PANEL
ALK PHOS: 70 U/L (ref 39–117)
ALT: 10 U/L (ref 0–35)
AST: 13 U/L (ref 0–37)
Albumin: 3.5 g/dL (ref 3.5–5.2)
BILIRUBIN TOTAL: 0.2 mg/dL (ref 0.2–1.2)
BUN: 44 mg/dL — ABNORMAL HIGH (ref 6–23)
CO2: 14 meq/L — AB (ref 19–32)
Calcium: 8.9 mg/dL (ref 8.4–10.5)
Chloride: 100 mEq/L (ref 96–112)
Creat: 2.27 mg/dL — ABNORMAL HIGH (ref 0.50–1.10)
Glucose, Bld: 104 mg/dL — ABNORMAL HIGH (ref 70–99)
Potassium: 4.3 mEq/L (ref 3.5–5.3)
SODIUM: 127 meq/L — AB (ref 135–145)
TOTAL PROTEIN: 6.9 g/dL (ref 6.0–8.3)

## 2013-09-03 ENCOUNTER — Telehealth: Payer: Self-pay

## 2013-09-03 ENCOUNTER — Telehealth: Payer: Self-pay | Admitting: Internal Medicine

## 2013-09-03 DIAGNOSIS — N183 Chronic kidney disease, stage 3 unspecified: Secondary | ICD-10-CM

## 2013-09-03 DIAGNOSIS — N189 Chronic kidney disease, unspecified: Secondary | ICD-10-CM | POA: Insufficient documentation

## 2013-09-03 DIAGNOSIS — D509 Iron deficiency anemia, unspecified: Secondary | ICD-10-CM

## 2013-09-03 DIAGNOSIS — N289 Disorder of kidney and ureter, unspecified: Secondary | ICD-10-CM

## 2013-09-03 DIAGNOSIS — E871 Hypo-osmolality and hyponatremia: Secondary | ICD-10-CM | POA: Insufficient documentation

## 2013-09-03 NOTE — Telephone Encounter (Addendum)
LATE ENTRY:  Reviewed labs on Friday afternoon (08/31/2013) and call patient at [phone].  Unable to reach patient. Also tried contacting her over the weekend and no answer. Pt has significantly abnormal electrolytes in a setting of Myasthenia Gravis and significant leg wounds. She also has microcytic anemia. On her last examination she was alert and oriented x 3. This is probably chronic but will likely require hospitalization. Will see patient in the office for evaluation. Pt to stop ibuprofen and Lisinopril. Will also check Urinalysis and Iron panel.  Addendum: from yesterday afternoon (09/03/2013) Nurse called patient for direct admission and patient reports that she will come tomorrow instead of today. I called patient and impressed upon her the impotance a timely admission in light of her renal function and electrolytes. I also advised patient to stop lisinopril, potassiumand Ibuprofen. She states that she had some vision changes after taking her Lisinopril this weekend, but that this has resolved and she feels fine now so she feels comfortable waiting until tomorrow. I advised her to go to the ED if she has any further vision symptoms or any SOB, weakness, N/V.D

## 2013-09-03 NOTE — Telephone Encounter (Signed)
Called and advised patient of electrolytes being out of balance and that She needs to come in to be admitted, Patient states she can Not come today due to transportation issues, but could come tomorrow. Spoke with [name] about this verbally. Thanks!

## 2013-09-04 ENCOUNTER — Ambulatory Visit: Payer: Medicare Other | Admitting: Internal Medicine

## 2013-09-04 ENCOUNTER — Encounter (HOSPITAL_COMMUNITY): Payer: Self-pay | Admitting: Emergency Medicine

## 2013-09-04 ENCOUNTER — Emergency Department (HOSPITAL_COMMUNITY): Payer: Medicare Other

## 2013-09-04 ENCOUNTER — Inpatient Hospital Stay (HOSPITAL_COMMUNITY)
Admission: EM | Admit: 2013-09-04 | Discharge: 2013-09-09 | DRG: 871 | Disposition: A | Discharge status: Home Health | Payer: Medicare Other | Attending: Internal Medicine | Admitting: Internal Medicine

## 2013-09-04 DIAGNOSIS — J45909 Unspecified asthma, uncomplicated: Secondary | ICD-10-CM | POA: Diagnosis present

## 2013-09-04 DIAGNOSIS — L97909 Non-pressure chronic ulcer of unspecified part of unspecified lower leg with unspecified severity: Secondary | ICD-10-CM | POA: Diagnosis not present

## 2013-09-04 DIAGNOSIS — I129 Hypertensive chronic kidney disease with stage 1 through stage 4 chronic kidney disease, or unspecified chronic kidney disease: Secondary | ICD-10-CM | POA: Diagnosis present

## 2013-09-04 DIAGNOSIS — D638 Anemia in other chronic diseases classified elsewhere: Secondary | ICD-10-CM | POA: Diagnosis present

## 2013-09-04 DIAGNOSIS — N183 Chronic kidney disease, stage 3 unspecified: Secondary | ICD-10-CM | POA: Diagnosis present

## 2013-09-04 DIAGNOSIS — R0602 Shortness of breath: Secondary | ICD-10-CM | POA: Diagnosis not present

## 2013-09-04 DIAGNOSIS — Z9119 Patient's noncompliance with other medical treatment and regimen: Secondary | ICD-10-CM | POA: Diagnosis not present

## 2013-09-04 DIAGNOSIS — I959 Hypotension, unspecified: Secondary | ICD-10-CM | POA: Diagnosis not present

## 2013-09-04 DIAGNOSIS — D509 Iron deficiency anemia, unspecified: Secondary | ICD-10-CM | POA: Diagnosis present

## 2013-09-04 DIAGNOSIS — E871 Hypo-osmolality and hyponatremia: Secondary | ICD-10-CM | POA: Diagnosis present

## 2013-09-04 DIAGNOSIS — N170 Acute kidney failure with tubular necrosis: Secondary | ICD-10-CM | POA: Diagnosis present

## 2013-09-04 DIAGNOSIS — M069 Rheumatoid arthritis, unspecified: Secondary | ICD-10-CM | POA: Diagnosis present

## 2013-09-04 DIAGNOSIS — E869 Volume depletion, unspecified: Secondary | ICD-10-CM | POA: Diagnosis not present

## 2013-09-04 DIAGNOSIS — J40 Bronchitis, not specified as acute or chronic: Secondary | ICD-10-CM | POA: Diagnosis not present

## 2013-09-04 DIAGNOSIS — I509 Heart failure, unspecified: Secondary | ICD-10-CM | POA: Diagnosis present

## 2013-09-04 DIAGNOSIS — R011 Cardiac murmur, unspecified: Secondary | ICD-10-CM | POA: Diagnosis present

## 2013-09-04 DIAGNOSIS — N179 Acute kidney failure, unspecified: Secondary | ICD-10-CM | POA: Diagnosis not present

## 2013-09-04 DIAGNOSIS — I872 Venous insufficiency (chronic) (peripheral): Secondary | ICD-10-CM | POA: Diagnosis present

## 2013-09-04 DIAGNOSIS — Z452 Encounter for adjustment and management of vascular access device: Secondary | ICD-10-CM | POA: Diagnosis not present

## 2013-09-04 DIAGNOSIS — E876 Hypokalemia: Secondary | ICD-10-CM | POA: Diagnosis present

## 2013-09-04 DIAGNOSIS — E875 Hyperkalemia: Secondary | ICD-10-CM | POA: Diagnosis not present

## 2013-09-04 DIAGNOSIS — G7000 Myasthenia gravis without (acute) exacerbation: Secondary | ICD-10-CM | POA: Diagnosis present

## 2013-09-04 DIAGNOSIS — R112 Nausea with vomiting, unspecified: Secondary | ICD-10-CM | POA: Diagnosis present

## 2013-09-04 DIAGNOSIS — N186 End stage renal disease: Secondary | ICD-10-CM | POA: Diagnosis not present

## 2013-09-04 DIAGNOSIS — E872 Acidosis, unspecified: Secondary | ICD-10-CM | POA: Diagnosis present

## 2013-09-04 DIAGNOSIS — E861 Hypovolemia: Secondary | ICD-10-CM | POA: Diagnosis present

## 2013-09-04 DIAGNOSIS — N19 Unspecified kidney failure: Secondary | ICD-10-CM | POA: Diagnosis not present

## 2013-09-04 DIAGNOSIS — R5381 Other malaise: Secondary | ICD-10-CM | POA: Diagnosis present

## 2013-09-04 DIAGNOSIS — Z86718 Personal history of other venous thrombosis and embolism: Secondary | ICD-10-CM | POA: Diagnosis not present

## 2013-09-04 DIAGNOSIS — N289 Disorder of kidney and ureter, unspecified: Secondary | ICD-10-CM | POA: Diagnosis not present

## 2013-09-04 DIAGNOSIS — A419 Sepsis, unspecified organism: Secondary | ICD-10-CM | POA: Diagnosis present

## 2013-09-04 DIAGNOSIS — R6521 Severe sepsis with septic shock: Secondary | ICD-10-CM

## 2013-09-04 DIAGNOSIS — Z885 Allergy status to narcotic agent status: Secondary | ICD-10-CM | POA: Diagnosis not present

## 2013-09-04 DIAGNOSIS — Z881 Allergy status to other antibiotic agents status: Secondary | ICD-10-CM | POA: Diagnosis not present

## 2013-09-04 DIAGNOSIS — I83009 Varicose veins of unspecified lower extremity with ulcer of unspecified site: Secondary | ICD-10-CM | POA: Diagnosis present

## 2013-09-04 DIAGNOSIS — I878 Other specified disorders of veins: Secondary | ICD-10-CM

## 2013-09-04 DIAGNOSIS — Z888 Allergy status to other drugs, medicaments and biological substances status: Secondary | ICD-10-CM

## 2013-09-04 DIAGNOSIS — Z791 Long term (current) use of non-steroidal anti-inflammatories (NSAID): Secondary | ICD-10-CM | POA: Diagnosis not present

## 2013-09-04 DIAGNOSIS — R5383 Other fatigue: Secondary | ICD-10-CM

## 2013-09-04 DIAGNOSIS — Z886 Allergy status to analgesic agent status: Secondary | ICD-10-CM

## 2013-09-04 DIAGNOSIS — I10 Essential (primary) hypertension: Secondary | ICD-10-CM | POA: Diagnosis present

## 2013-09-04 DIAGNOSIS — Z91199 Patient's noncompliance with other medical treatment and regimen due to unspecified reason: Secondary | ICD-10-CM | POA: Diagnosis not present

## 2013-09-04 DIAGNOSIS — T3995XA Adverse effect of unspecified nonopioid analgesic, antipyretic and antirheumatic, initial encounter: Secondary | ICD-10-CM | POA: Diagnosis present

## 2013-09-04 DIAGNOSIS — R6520 Severe sepsis without septic shock: Secondary | ICD-10-CM

## 2013-09-04 DIAGNOSIS — Z79899 Other long term (current) drug therapy: Secondary | ICD-10-CM

## 2013-09-04 DIAGNOSIS — I82409 Acute embolism and thrombosis of unspecified deep veins of unspecified lower extremity: Secondary | ICD-10-CM | POA: Diagnosis present

## 2013-09-04 DIAGNOSIS — E8729 Other acidosis: Secondary | ICD-10-CM

## 2013-09-04 DIAGNOSIS — L97919 Non-pressure chronic ulcer of unspecified part of right lower leg with unspecified severity: Secondary | ICD-10-CM | POA: Diagnosis present

## 2013-09-04 DIAGNOSIS — L97929 Non-pressure chronic ulcer of unspecified part of left lower leg with unspecified severity: Secondary | ICD-10-CM

## 2013-09-04 HISTORY — DX: Acute embolism and thrombosis of unspecified deep veins of unspecified lower extremity: I82.409

## 2013-09-04 LAB — COMPREHENSIVE METABOLIC PANEL
ALT: 9 U/L (ref 0–35)
ANION GAP: 26 — AB (ref 5–15)
AST: 10 U/L (ref 0–37)
Albumin: 3 g/dL — ABNORMAL LOW (ref 3.5–5.2)
Alkaline Phosphatase: 84 U/L (ref 39–117)
BUN: 92 mg/dL — ABNORMAL HIGH (ref 6–23)
CHLORIDE: 91 meq/L — AB (ref 96–112)
CO2: 9 mEq/L — CL (ref 19–32)
Calcium: 8.2 mg/dL — ABNORMAL LOW (ref 8.4–10.5)
Creatinine, Ser: 7.71 mg/dL — ABNORMAL HIGH (ref 0.50–1.10)
GFR calc Af Amer: 6 mL/min — ABNORMAL LOW (ref >90)
GFR calc non Af Amer: 5 mL/min — ABNORMAL LOW (ref >90)
Glucose, Bld: 123 mg/dL — ABNORMAL HIGH (ref 70–99)
Potassium: 7 mEq/L (ref 3.7–5.3)
SODIUM: 126 meq/L — AB (ref 137–147)
TOTAL PROTEIN: 8 g/dL (ref 6.0–8.3)

## 2013-09-04 LAB — I-STAT CG4 LACTIC ACID, ED: Lactic Acid, Venous: 1.28 mmol/L (ref 0.5–2.2)

## 2013-09-04 LAB — OSMOLALITY, URINE: OSMOLALITY UR: 286 mosm/kg — AB (ref 390–1090)

## 2013-09-04 LAB — URINALYSIS, ROUTINE W REFLEX MICROSCOPIC
BILIRUBIN URINE: NEGATIVE
GLUCOSE, UA: NEGATIVE mg/dL
HGB URINE DIPSTICK: NEGATIVE
KETONES UR: NEGATIVE mg/dL
Leukocytes, UA: NEGATIVE
NITRITE: NEGATIVE
PH: 5 (ref 5.0–8.0)
Protein, ur: NEGATIVE mg/dL
SPECIFIC GRAVITY, URINE: 1.011 (ref 1.005–1.030)
Urobilinogen, UA: 0.2 mg/dL (ref 0.0–1.0)

## 2013-09-04 LAB — CBC WITH DIFFERENTIAL/PLATELET
BASOS ABS: 0 10*3/uL (ref 0.0–0.1)
Basophils Relative: 0 % (ref 0–1)
EOS ABS: 0 10*3/uL (ref 0.0–0.7)
Eosinophils Relative: 0 % (ref 0–5)
HEMATOCRIT: 35.5 % — AB (ref 36.0–46.0)
Hemoglobin: 11.6 g/dL — ABNORMAL LOW (ref 12.0–15.0)
LYMPHS PCT: 3 % — AB (ref 12–46)
Lymphs Abs: 0.3 10*3/uL — ABNORMAL LOW (ref 0.7–4.0)
MCH: 22.7 pg — ABNORMAL LOW (ref 26.0–34.0)
MCHC: 32.7 g/dL (ref 30.0–36.0)
MCV: 69.3 fL — ABNORMAL LOW (ref 78.0–100.0)
MONOS PCT: 6 % (ref 3–12)
Monocytes Absolute: 0.7 10*3/uL (ref 0.1–1.0)
NEUTROS ABS: 9.9 10*3/uL — AB (ref 1.7–7.7)
Neutrophils Relative %: 91 % — ABNORMAL HIGH (ref 43–77)
Platelets: 332 10*3/uL (ref 150–400)
RBC: 5.12 MIL/uL — ABNORMAL HIGH (ref 3.87–5.11)
RDW: 18.7 % — ABNORMAL HIGH (ref 11.5–15.5)
WBC: 10.9 10*3/uL — AB (ref 4.0–10.5)

## 2013-09-04 LAB — BASIC METABOLIC PANEL
ANION GAP: 29 — AB (ref 5–15)
BUN: 86 mg/dL — ABNORMAL HIGH (ref 6–23)
CO2: 8 meq/L — AB (ref 19–32)
Calcium: 7.8 mg/dL — ABNORMAL LOW (ref 8.4–10.5)
Chloride: 96 mEq/L (ref 96–112)
Creatinine, Ser: 6.77 mg/dL — ABNORMAL HIGH (ref 0.50–1.10)
GFR calc Af Amer: 7 mL/min — ABNORMAL LOW (ref >90)
GFR calc non Af Amer: 6 mL/min — ABNORMAL LOW (ref >90)
Glucose, Bld: 93 mg/dL (ref 70–99)
POTASSIUM: 6.3 meq/L — AB (ref 3.7–5.3)
SODIUM: 133 meq/L — AB (ref 137–147)

## 2013-09-04 LAB — TROPONIN I: Troponin I: <0.3 ng/mL (ref <0.30)

## 2013-09-04 LAB — MRSA PCR SCREENING: MRSA BY PCR: POSITIVE — AB

## 2013-09-04 LAB — SODIUM, URINE, RANDOM: Sodium, Ur: 60 mEq/L

## 2013-09-04 LAB — CREATININE, URINE, RANDOM: CREATININE, URINE: 64.84 mg/dL

## 2013-09-04 MED ORDER — SODIUM BICARBONATE 8.4 % IV SOLN
50.0000 meq | Freq: Once | INTRAVENOUS | Status: AC
  Administered 2013-09-04: 50 meq via INTRAVENOUS
  Filled 2013-09-04: qty 50

## 2013-09-04 MED ORDER — OXYCODONE HCL 5 MG PO TABS
5.0000 mg | ORAL_TABLET | Freq: Four times a day (QID) | ORAL | Status: DC | PRN
  Administered 2013-09-04 – 2013-09-09 (×17): 5 mg via ORAL
  Filled 2013-09-04 (×17): qty 1

## 2013-09-04 MED ORDER — CALCIUM GLUCONATE 10 % IV SOLN: 1.0000 g | Freq: Once | INTRAVENOUS | Status: DC

## 2013-09-04 MED ORDER — MUPIROCIN 2 % EX OINT
1.0000 "application " | TOPICAL_OINTMENT | Freq: Two times a day (BID) | CUTANEOUS | Status: AC
  Administered 2013-09-04 – 2013-09-09 (×10): 1 via NASAL
  Filled 2013-09-04: qty 22

## 2013-09-04 MED ORDER — SODIUM CHLORIDE 0.9 % IV BOLUS (SEPSIS): 250.0000 mL | Freq: Once | INTRAVENOUS | Status: DC

## 2013-09-04 MED ORDER — FUROSEMIDE 10 MG/ML IJ SOLN
40.0000 mg | Freq: Once | INTRAMUSCULAR | Status: AC
  Administered 2013-09-04: 40 mg via INTRAVENOUS
  Filled 2013-09-04: qty 4

## 2013-09-04 MED ORDER — SODIUM CHLORIDE 0.9 % IV BOLUS (SEPSIS)
1000.0000 mL | Freq: Once | INTRAVENOUS | Status: AC
  Administered 2013-09-04: 1000 mL via INTRAVENOUS

## 2013-09-04 MED ORDER — SODIUM CHLORIDE 0.9 % IV BOLUS (SEPSIS)
500.0000 mL | Freq: Once | INTRAVENOUS | Status: DC
  Administered 2013-09-04: 500 mL via INTRAVENOUS

## 2013-09-04 MED ORDER — HEPARIN SODIUM (PORCINE) 5000 UNIT/ML IJ SOLN
5000.0000 [IU] | Freq: Three times a day (TID) | INTRAMUSCULAR | Status: DC
  Administered 2013-09-04 – 2013-09-09 (×16): 5000 [IU] via SUBCUTANEOUS
  Filled 2013-09-04 (×18): qty 1

## 2013-09-04 MED ORDER — PYRIDOSTIGMINE BROMIDE 60 MG PO TABS
60.0000 mg | ORAL_TABLET | Freq: Four times a day (QID) | ORAL | Status: DC
  Administered 2013-09-04 – 2013-09-09 (×22): 60 mg via ORAL
  Filled 2013-09-04 (×24): qty 1

## 2013-09-04 MED ORDER — HYDROCODONE-ACETAMINOPHEN 5-325 MG PO TABS
1.0000 | ORAL_TABLET | ORAL | Status: DC | PRN
Start: 2013-09-04 — End: 2013-09-09

## 2013-09-04 MED ORDER — SODIUM CHLORIDE 0.9 % IJ SOLN
3.0000 mL | Freq: Two times a day (BID) | INTRAMUSCULAR | Status: DC
  Administered 2013-09-04 – 2013-09-07 (×6): 3 mL via INTRAVENOUS
  Administered 2013-09-08: 22:00:00 via INTRAVENOUS
  Administered 2013-09-09: 3 mL via INTRAVENOUS

## 2013-09-04 MED ORDER — SODIUM POLYSTYRENE SULFONATE 15 GM/60ML PO SUSP
60.0000 g | Freq: Once | ORAL | Status: AC
  Administered 2013-09-04: 60 g via ORAL
  Filled 2013-09-04: qty 240

## 2013-09-04 MED ORDER — SODIUM BICARBONATE 8.4 % IV SOLN
INTRAVENOUS | Status: DC
  Administered 2013-09-04 – 2013-09-06 (×5): via INTRAVENOUS
  Filled 2013-09-04 (×11): qty 150

## 2013-09-04 MED ORDER — SODIUM CHLORIDE 0.9 % IV SOLN
1.0000 g | Freq: Once | INTRAVENOUS | Status: AC
  Administered 2013-09-04: 1 g via INTRAVENOUS
  Filled 2013-09-04: qty 10

## 2013-09-04 MED ORDER — SODIUM CHLORIDE 0.9 % IV BOLUS (SEPSIS)
2000.0000 mL | Freq: Once | INTRAVENOUS | Status: AC
  Administered 2013-09-04: 2000 mL via INTRAVENOUS

## 2013-09-04 MED ORDER — MORPHINE SULFATE 4 MG/ML IJ SOLN
6.0000 mg | Freq: Once | INTRAMUSCULAR | Status: AC
  Administered 2013-09-04: 6 mg via INTRAVENOUS
  Filled 2013-09-04 (×2): qty 2

## 2013-09-04 MED ORDER — CHLORHEXIDINE GLUCONATE CLOTH 2 % EX PADS: 6.0000 | MEDICATED_PAD | Freq: Every day | CUTANEOUS | Status: DC

## 2013-09-04 NOTE — Progress Notes (Signed)
CRITICAL VALUE ALERT  Critical value received:  CO2 8   Date of notification:  09-04-13   Time of notification:  3:30 pm  Critical value read back: yes Nurse who received alert:  Kadden Osterhout, RN   MD notified (1st page):  Dr. Verlon Au   Time of first page:  3:35 pm spoke with Dr.Samtani in person   MD notified (2nd page):  Time of second page:  Responding MD:  Dr. Verlon Au on unit   Time MD responded:

## 2013-09-04 NOTE — H&P (Addendum)
[facility] History and Physical  [name] DOB: [birth year] DOA: 09/04/2013  Referring physician: ED PCP: MATTHEWS,MICHELLE A., MD  Specialists: Renal  Chief Complaint: AKI, Lower extremity pain  HPI: 53 y/o ?, h/o Myasthenia gravis diag age 41 on Mestinon under care [facility] Dr. Krista Blue, LE ulcers since 2006 [operations in [place], [place], [place], previous h/o AKI in 2013 on NSAIDs, Htn, Reported CHF [no echo in system], prior pancreatitis 2/2 to Doxy, reported Rheum arthritis, H/o prior DVT in 2000 and then in 2004-now not on coumadin?, H/o Thalassemia major? Who came to the ED 09/04/13 as she felt she was more SOB and haviong some wheezing.  This is not usual for her.  She has a reported h/o CHF but doesn't appear to be on lasix.  SHe thinks she also felt like she was having an attack of her MG coming on and felt that the Lisnopril that she was prescribed by Dr. Melida Gimenez last week might have had something to do with this.  [name] actually called me this morning and confirm that patient was on lisinopril 40/HCTZ 25 and had stopped taking this and should prescribe 10 mg lisinopril which is very reasonable Patient tells me that before she got a prescription for narcotics she was taking 10 tablets of ibuprofen 200 mg 4 times day equaling 40 tablets of ibuprofen for a reasonably extended period of time for pain she did not have any medication to control her lower extremity pain. She's not been urinating as regularly as usual  Emergency room workup blood gas at 1.2, chest x-ray bronchitic changes Sodium 126, potassium 7.0, BUN 92, creatinine 7.71, anion gap 26, total bilirubin 0.2 Hemoglobin 11.6 hematocrit 35.5 platelets 332, date 10.9 polychromasia Ultrasound kidneys showed no obstructive uropathy however increased renal cortical echogenicity with medical renal disease Urine osmolality, urine sodium, urine creatinine pending     Review of Systems: The patient denies Chest  pain, Fever, Vomiting , Diarrhea She does endorse some nausea She has had some joint stiffness upper and lower extremities Denies dark tarry stools  Past Medical History  Diagnosis Date  . Asthma   . Hypertension   . CHF (congestive heart failure)   . Myasthenia gravis   . Heart murmur   . Venous stasis    Past Surgical History  Procedure Laterality Date  . Tonsillectomy    . Myomectomy     Social History:  History   Social History Narrative   Patient lives at home alone.   Disabled.   Education college.   Right handed.   Caffeine patient drinks coffee.    Allergies  Allergen Reactions  . Asa [Aspirin] Shortness Of Breath and Rash  . Dilaudid [Hydromorphone Hcl] Shortness Of Breath and Palpitations  . Erythromycin Other (See Comments)    Burn veins  . Indomethacin Other (See Comments)    Stroke symptoms  . Naproxen Other (See Comments)    Renal failure  . Alginate-Collagen [Wound Dressings] Rash  . Gold-Containing Drug Products Rash  . Silvadene [Silver Sulfadiazine] Rash  . Tylenol [Acetaminophen] Rash and Other (See Comments)    Salivation and hearing loss    Family History  Problem Relation Age of Onset  . Depression    . Cancer      Prior to Admission medications   Medication Sig Start Date End Date Taking? Authorizing Provider  albuterol (PROVENTIL HFA;VENTOLIN HFA) 108 (90 BASE) MCG/ACT inhaler Inhale 4 puffs into the lungs every 6 (six) hours as needed. For  shortness of breath   Yes Historical Provider, MD  COD LIVER OIL PO Take 2 capsules by mouth daily.   Yes Historical Provider, MD  diphenhydrAMINE (BENADRYL) 25 MG tablet Take 50 mg by mouth 4 (four) times daily.   Yes Historical Provider, MD  Emollient (NIVEA) cream Apply 1 application topically daily. Apply all over body   Yes Historical Provider, MD  EPINEPHrine (EPIPEN) 0.3 mg/0.3 mL DEVI Inject 0.3 mg into the muscle as needed. For allergic reaction   Yes Historical Provider, MD  ferrous  sulfate 325 (65 FE) MG tablet Take 325-650 mg by mouth daily with breakfast.   Yes Historical Provider, MD  folic acid (FOLVITE) 696 MCG tablet Take 400 mcg by mouth daily.   Yes Historical Provider, MD  Garlic Oil 2952 MG CAPS Take 1,000 mg by mouth daily.   Yes Historical Provider, MD  ibuprofen (ADVIL,MOTRIN) 200 MG tablet Take 2,000 mg by mouth 4 (four) times daily. Takes 10 tablets to get 2000mg    Yes Historical Provider, MD  lidocaine (XYLOCAINE) 4 % external solution Apply 5 mLs topically 4 (four) times daily.  05/31/13  Yes Historical Provider, MD  lisinopril (PRINIVIL,ZESTRIL) 10 MG tablet Take 1 tablet (10 mg total) by mouth daily. 08/30/13  Yes Leana Gamer, MD  Multiple Vitamin (MULTIVITAMIN WITH MINERALS) TABS Take 1 tablet by mouth daily. 1 gummy   Yes Historical Provider, MD  Multiple Vitamins-Minerals (ZINC PO) Take 1 tablet by mouth daily.   Yes Historical Provider, MD  Omega-3 Fatty Acids (FISH OIL) 500 MG CAPS Take 1,000 mg by mouth daily.   Yes Historical Provider, MD  oxyCODONE (OXY IR/ROXICODONE) 5 MG immediate release tablet Take 1-2 tablets (5-10 mg total) by mouth every 4 (four) hours as needed. 08/30/13  Yes Leana Gamer, MD  POTASSIUM GLUCONATE PO Take 1 tablet by mouth daily.   Yes Historical Provider, MD  pyridostigmine (MESTINON) 60 MG tablet Take 60 mg by mouth 4 (four) times daily.   Yes Historical Provider, MD   Physical Exam: Filed Vitals:   09/04/13 0755 09/04/13 0836  BP: 86/50   Pulse: 114   Temp: 97.7 F (36.5 C)   TempSrc: Oral   Resp: 18   SpO2: 88% 100%     General:  Alert debilitated appearing and relatively unkempt  Eyes: EOMI no icterus mild pallor  ENT: Moderate dentition heart mucosa dry  Neck: JVD mildly elevated  Cardiovascular: S1-S2 no murmur rub or gallop  Respiratory: Clinically he has some Rales in lower lung fields, no fremitus  Abdomen: Soft nontender postop changes central mid abdominal scar  Skin:    Musculoskeletal: Range of motion intact power seems intact  Psychiatric: Euthymic  Neurologic: Moving all 4 limbs equally, reflexes 2/3 finger-nose-finger test negative cannot do heel-to-shin test however no dysdiadochokinesia, sensory intact smile symmetric  Labs on Admission:  Basic Metabolic Panel:  Recent Labs Lab 08/30/13 1605 09/04/13 0819  NA 127* 126*  K 4.3 7.0*  CL 100 91*  CO2 14* 9*  GLUCOSE 104* 123*  BUN 44* 92*  CREATININE 2.27* 7.71*  CALCIUM 8.9 8.2*  MG 2.4  --    Liver Function Tests:  Recent Labs Lab 08/30/13 1605 09/04/13 0819  AST 13 10  ALT 10 9  ALKPHOS 70 84  BILITOT 0.2 <0.2*  PROT 6.9 8.0  ALBUMIN 3.5 3.0*   No results found for this basename: LIPASE, AMYLASE,  in the last 168 hours No results found for this basename:  AMMONIA,  in the last 168 hours CBC:  Recent Labs Lab 08/30/13 1605 09/04/13 0819  WBC 6.9 10.9*  NEUTROABS 4.8 9.9*  HGB 11.5* 11.6*  HCT 35.0* 35.5*  MCV 68.8* 69.3*  PLT 367 332   Cardiac Enzymes:  Recent Labs Lab 09/04/13 0819  TROPONINI <0.30    BNP (last 3 results) No results found for this basename: PROBNP,  in the last 8760 hours CBG: No results found for this basename: GLUCAP,  in the last 168 hours  Radiological Exams on Admission: Dg Chest 2 View  09/04/2013   CLINICAL DATA:  Shortness of breath.  Wheezing.  EXAM: CHEST  2 VIEW  COMPARISON:  10/21/2010  FINDINGS: Heart size and pulmonary vascularity are normal. No infiltrates or effusions. Slight peribronchial thickening consistent with bronchitis. No acute osseous abnormality.  IMPRESSION: Bronchitic changes.   Electronically Signed   By: Rozetta Nunnery M.D.   On: 09/04/2013 08:51    EKG: Independently reviewed. Sinus rhythm PR interval 0 point 12 QRS axis 40 T waves are peaked in V 2 through V4  Assessment/Plan Principal Problem:   ARF (acute renal failure)-the patient will need [facility]  patient has been givencalcium gluconate and bicarbonate.  She does have T-wave peaking she'll be given Kayexalate 60 milligrams at her instruction of nephrology. I will also placed on a bicarbonate drip. We'll have to balance her ins and outs very carefully and a Foley will be placed. It is unclear if she will recover from this kidney insult so hopefully this is all just ATN. Urine studies urine sodium, urine creatinine, osmolality are pending. Ultrasound does not reveal any obstructive uropathy.  She will need education regarding nonsteroidal medications for pain as this has happened before in the past and has recurred because of her lower extremity pain.  Metabolic acidosis, increased anion gap (IAG) seco we will repeat basic metabolic panel at 3 PM and appreciate nephrology input in advance ndary to renal failure. Lactic acid is not elevated she has no fever. She received bicarbonate as above-   Ulcers of both lower legs-we will get wound care consult does not last seen by plastic surgery there was any specific treatment that was recommended as flat would've been out of the question. May consider nonemergent consult to Dr. Migdalia Dk later on during admission   Myasthenia gravis-we will ask neurology to comment on her care as well as medications-he states that she felt like she was having an attack coming on however I did not see any evidence of this on her neurological exam-Dr. Leonel Ramsay will see her this admission   Hypertension-she is actually little hypotensive and we will hold off on medications for now-do not believe that she is   Rheumatoid arthritis-awake CCP ordered by Dr. Rodman Key.  She will need outpatient care for this and DMARD's if the appropriate   Hyponatremia-secondary to renal failure   DVT (deep venous thrombosis), HO/o not on AC now-this may need to be restarted   CHF, acute-patient presents with some JVD and mild Rales on exam. We will get an echocardiogram.  She is not very compliant on  her medications and this will need to be reemphasized to her   Noncompliance-this is perhaps the most pressing issue she will need continued medical care and this will have to be reemphasized   Time spent: 75 minutes Discussed with PCP, nephrology Step down to [place] 3-5 days  Verlon Au [facility] Pager  [phone]  If 7PM-7AM, please contact night-coverage [email or web address] Password [facility] 09/04/2013, 10:26 AM

## 2013-09-04 NOTE — ED Notes (Signed)
Pt going to ICU bed [facility]?  No ICU beds at this time.  Admit/transfer delayed.

## 2013-09-04 NOTE — Progress Notes (Signed)
  CARE MANAGEMENT ED NOTE 09/04/2013  Patient:  [name],[name]   Account Number:  [id number]  Date Initiated:  09/04/2013  Documentation initiated by:  Jackelyn Poling  Subjective/Objective Assessment:   53 yr old medicare [place] pt from home with sob supposed to be checked today at [place] for CHF.  Pt was started on "fluid pill" and couldn't take it b/c it made "her go blind" all weekend. 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  She previously was under the care, plastic surgeon locally.  She currently is managing her wounds with sanitary napkins in plastic bags  bladder scan = 17 cc  pcp [facility] Last f/u visit noted for 08/30/13 see epic notes  for admission to hospitalization     Action/Plan:   ED CM noted CM consult for Patient has bilateral lower extremity leg ulcers-will probably need home health follow up.  Patient has myasthenias gravis and is basically wheelchair bound CM spoke with pt about cm consult Her choice of   Action/Plan Detail:   home health agency is [facility] (previous services) Cm spoke with [place] at 1055. Jackelyn Poling will follow pt for d/c needs Provided pt with contact information for [facility] placed in pt belonging bag   Anticipated DC Date:  09/07/2013     Status Recommendation to Physician:   Result of Recommendation:    Other ED [place]  Other  Outpatient Services - Pt will follow up   [place]   Choice offered to / List presented to:       DME agency  [facility]    Status of service:  Completed, signed off  ED Comments:   ED Comments Detail:

## 2013-09-04 NOTE — Plan of Care (Signed)
Baltazar Najjar, NP paged regarding low BP. Will give 1L NS over 2 hours and reassess pressures. At this time, pt is asymptomatic. She is alert and oriented, denying any feelings of dizziness or being light headed.

## 2013-09-04 NOTE — ED Provider Notes (Signed)
CSN: [id number]     Arrival date & time 09/04/13  8527 History   First MD Initiated Contact with Patient 09/04/13 0757     Chief Complaint  Patient presents with  . Shortness of Breath  . Dysuria      HPI Patient reports increased shortness of breath without orthopnea over the past 2-3 days.  She also complains of no urine output since yesterday.  She does not feel the need to urinate.  She has a long-standing history of chronic venous stasis ulcerations of her lower extremities that have undergone multiple skin grafts at outside hospitals before and have failed.  She previously was under the care, plastic surgeon locally.  She currently is managing her wounds with sanitary napkins in plastic bags.  She states no fevers or chills.  She denies productive cough.  She does report a history of DVT in the past.  She is not on anticoagulants.  She otherwise is compliant with her medications.  She recently began under the care of [name] and was seen in the office several days ago and was found to be hyponatremic, acidotic, in renal failure as compared to prior laboratory studies.  Patient states that she has had renal failure once before in the past after anti-inflammatories but never required dialysis.  She does report a history of asthma congestive heart failure as well.  She did not try her albuterol today.   Past Medical History  Diagnosis Date  . Asthma   . Hypertension   . CHF (congestive heart failure)   . Myasthenia gravis   . Heart murmur   . Venous stasis    Past Surgical History  Procedure Laterality Date  . Tonsillectomy    . Myomectomy     Family History  Problem Relation Age of Onset  . Depression    . Cancer     History  Substance Use Topics  . Smoking status: Never Smoker   . Smokeless tobacco: Never Used  . Alcohol Use: No   OB History   Grav Para Term Preterm Abortions TAB SAB Ect Mult Living                 Review of Systems  All other systems reviewed  and are negative.     Allergies  Asa; Dilaudid; Erythromycin; Indomethacin; Naproxen; Alginate-collagen; Gold-containing drug products; Silvadene; and Tylenol  Home Medications   Prior to Admission medications   Medication Sig Start Date End Date Taking? Authorizing Provider  albuterol (PROVENTIL HFA;VENTOLIN HFA) 108 (90 BASE) MCG/ACT inhaler Inhale 4 puffs into the lungs every 6 (six) hours as needed. For shortness of breath   Yes Historical Provider, MD  COD LIVER OIL PO Take 2 capsules by mouth daily.   Yes Historical Provider, MD  diphenhydrAMINE (BENADRYL) 25 MG tablet Take 50 mg by mouth 4 (four) times daily.   Yes Historical Provider, MD  Emollient (NIVEA) cream Apply 1 application topically daily. Apply all over body   Yes Historical Provider, MD  EPINEPHrine (EPIPEN) 0.3 mg/0.3 mL DEVI Inject 0.3 mg into the muscle as needed. For allergic reaction   Yes Historical Provider, MD  ferrous sulfate 325 (65 FE) MG tablet Take 325-650 mg by mouth daily with breakfast.   Yes Historical Provider, MD  folic acid (FOLVITE) 782 MCG tablet Take 400 mcg by mouth daily.   Yes Historical Provider, MD  Garlic Oil 4235 MG CAPS Take 1,000 mg by mouth daily.   Yes Historical Provider, MD  ibuprofen (ADVIL,MOTRIN) 200 MG tablet Take 2,000 mg by mouth 4 (four) times daily. Takes 10 tablets to get 2000mg    Yes Historical Provider, MD  lidocaine (XYLOCAINE) 4 % external solution Apply 5 mLs topically 4 (four) times daily.  05/31/13  Yes Historical Provider, MD  lisinopril (PRINIVIL,ZESTRIL) 10 MG tablet Take 1 tablet (10 mg total) by mouth daily. 08/30/13  Yes Leana Gamer, MD  Multiple Vitamin (MULTIVITAMIN WITH MINERALS) TABS Take 1 tablet by mouth daily. 1 gummy   Yes Historical Provider, MD  Multiple Vitamins-Minerals (ZINC PO) Take 1 tablet by mouth daily.   Yes Historical Provider, MD  Omega-3 Fatty Acids (FISH OIL) 500 MG CAPS Take 1,000 mg by mouth daily.   Yes Historical Provider, MD   oxyCODONE (OXY IR/ROXICODONE) 5 MG immediate release tablet Take 1-2 tablets (5-10 mg total) by mouth every 4 (four) hours as needed. 08/30/13  Yes Leana Gamer, MD  POTASSIUM GLUCONATE PO Take 1 tablet by mouth daily.   Yes Historical Provider, MD  pyridostigmine (MESTINON) 60 MG tablet Take 60 mg by mouth 4 (four) times daily.   Yes Historical Provider, MD   BP 86/50  Pulse 114  Temp(Src) 97.7 F (36.5 C) (Oral)  Resp 18  SpO2 100%  LMP 07/12/2011 Physical Exam  Nursing note and vitals reviewed. Constitutional: She is oriented to person, place, and time. She appears well-developed and well-nourished. No distress.  HENT:  Head: Normocephalic and atraumatic.  Eyes: EOM are normal.  Neck: Normal range of motion.  Cardiovascular: Normal rate, regular rhythm and normal heart sounds.   Pulmonary/Chest: Effort normal and breath sounds normal. No respiratory distress. She has no wheezes.  Abdominal: Soft. She exhibits no distension. There is no tenderness.  Musculoskeletal:  Chronic appearing significant wounds of her bilateral lower extremities with mild erythema without foul smell or drainage.  Strong pulses in her bilateral feet.  Neurological: She is alert and oriented to person, place, and time.  Skin: Skin is warm and dry.  Psychiatric: She has a normal mood and affect. Judgment normal.    ED Course  Procedures (including critical care time)  CRITICAL CARE Performed by: Hoy Morn Total critical care time: 35 Critical care time was exclusive of separately billable procedures and treating other patients. Critical care was necessary to treat or prevent imminent or life-threatening deterioration. Critical care was time spent personally by me on the following activities: development of treatment plan with patient and/or surrogate as well as nursing, discussions with consultants, evaluation of patient's response to treatment, examination of patient, obtaining history from  patient or surrogate, ordering and performing treatments and interventions, ordering and review of laboratory studies, ordering and review of radiographic studies, pulse oximetry and re-evaluation of patient's condition.  Labs Review Labs Reviewed  CBC WITH DIFFERENTIAL - Abnormal; Notable for the following:    WBC 10.9 (*)    RBC 5.12 (*)    Hemoglobin 11.6 (*)    HCT 35.5 (*)    MCV 69.3 (*)    MCH 22.7 (*)    RDW 18.7 (*)    Neutrophils Relative % 91 (*)    Lymphocytes Relative 3 (*)    Neutro Abs 9.9 (*)    Lymphs Abs 0.3 (*)    All other components within normal limits  COMPREHENSIVE METABOLIC PANEL - Abnormal; Notable for the following:    Sodium 126 (*)    Potassium 7.0 (*)    Chloride 91 (*)    CO2 9 (*)  Glucose, Bld 123 (*)    BUN 92 (*)    Creatinine, Ser 7.71 (*)    Calcium 8.2 (*)    Albumin 3.0 (*)    Total Bilirubin <0.2 (*)    GFR calc non Af Amer 5 (*)    GFR calc Af Amer 6 (*)    Anion gap 26 (*)    All other components within normal limits  TROPONIN I  URINALYSIS, ROUTINE W REFLEX MICROSCOPIC  I-STAT CG4 LACTIC ACID, ED    Imaging Review Dg Chest 2 View  09/04/2013   CLINICAL DATA:  Shortness of breath.  Wheezing.  EXAM: CHEST  2 VIEW  COMPARISON:  10/21/2010  FINDINGS: Heart size and pulmonary vascularity are normal. No infiltrates or effusions. Slight peribronchial thickening consistent with bronchitis. No acute osseous abnormality.  IMPRESSION: Bronchitic changes.   Electronically Signed   By: Rozetta Nunnery M.D.   On: 09/04/2013 08:51     EKG Interpretation   Date/Time:  Tuesday September 04 2013 08:05:59 EDT Ventricular Rate:  103 PR Interval:  164 QRS Duration: 104 QT Interval:  390 QTC Calculation: 510 R Axis:   37 Text Interpretation:  Sinus tachycardia Probable left atrial enlargement  Prolonged QT interval peaked t wave diffusely No significant change was  found Reconfirmed by Ronia Hazelett  MD, Lennette Bihari ([id number]) on 09/04/2013 9:50:21 AM       MDM   Final diagnoses:  None    Patient with evidence of worsening renal failure.  Patient is acidotic with bicarbonate 9.  She may require dialysis 2.  A catheter will be placed to manage and monitor urine output.  Bedside bladder scan was completed with 17 cc of urine.  It sounds that the patient is neatly and uric.  I think she'll benefit from transfer to [facility] in consultation by the nephrology team.  Her hyperkalemia of 7 will be managed in the emergency department with bicarbonate, calcium, Lasix.  She has remained on a cardiac monitor while in the ER and no arrhythmias have been noted.  She does have peak T waves on EKG.  Unclear etiology of renal failure.  She does report that she's had increasing pain in her lower extremities and hasn't been getting out to eat or drink over the past several days but states she has been drinking juice.  She currently is not having her lower extremities managed by a wound care team.  She will need wound care consultation in the hospital    Hoy Morn, MD 09/04/13 [phone]

## 2013-09-04 NOTE — Plan of Care (Signed)
500cc bolus infusing per order. Will reassess BP after bolus infuses. Pt remains asymptomatic.

## 2013-09-04 NOTE — Consult Note (Signed)
Renal Service Consult Note [facility]  [name] [birth year][address] Requesting Physician:  Dr Verlon Au  Reason for Consult:  Acute on CKD, high K+ HPI: The patient is a 53 y.o. year-old with hx of HTN, myasthenia gravis, RA, and chronic venous stasis ulcers.  She  Came to ED today reporting SOB and some wheezing, also gen weakness. In ED creat was high at 7 with K of 7.0 and serum CO2 of 8.  She has been taking over a dozen ibuprofen 200 mg tablets daily for "months" , trying to treat the pain in her legs from the stasis ulcers.  She denies LE edema.  No n/v/d, no abd pain. No CP or SOB currently.  Voiding ok, no dysuria.    Chart review: 07/10 - acute pancreatitis felt due to doxycycline; venous stasis ulcers with polymicrobial skin infection, RA, Myasthenia graivs, anemia, HTN, hx DVT, asthma, obesity, anemia, OA, thalassemia major  08/10 -  07/12 - Bilat LE venous stasis ulcers, chronic and severe extensive.  HTN, asthma, myasthenia gravis, obesity, anemia, OA, OP 09/12 - Myasthenia exacerbation, also VS ulcers, ashtma, MG, RA, obesity, anemia, DJD 10/13 - Bilat LE venous stasis with stasis ulcers, seen by plast surg. AKI resolved.  Stop nsaids. HTN controlled  Past Medical History  Past Medical History  Diagnosis Date  . Asthma   . Hypertension   . CHF (congestive heart failure)   . Myasthenia gravis   . Heart murmur   . Venous stasis    Past Surgical History  Past Surgical History  Procedure Laterality Date  . Tonsillectomy    . Myomectomy    . Failed bilateral leg grafts     Family History  Family History  Problem Relation Age of Onset  . Depression    . Cancer     Social History  reports that she has never smoked. She has never used smokeless tobacco. She reports that she does not drink alcohol or use illicit drugs. Allergies  Allergies  Allergen Reactions  . Asa [Aspirin] Shortness Of Breath and Rash  . Dilaudid [Hydromorphone Hcl]  Shortness Of Breath and Palpitations  . Erythromycin Other (See Comments)    Burn veins  . Indomethacin Other (See Comments)    Stroke symptoms  . Naproxen Other (See Comments)    Renal failure  . Alginate-Collagen [Wound Dressings] Rash  . Gold-Containing Drug Products Rash  . Silvadene [Silver Sulfadiazine] Rash  . Tylenol [Acetaminophen] Rash and Other (See Comments)    Salivation and hearing loss   Home medications Prior to Admission medications   Medication Sig Start Date End Date Taking? Authorizing Provider  albuterol (PROVENTIL HFA;VENTOLIN HFA) 108 (90 BASE) MCG/ACT inhaler Inhale 4 puffs into the lungs every 6 (six) hours as needed. For shortness of breath   Yes Historical Provider, MD  COD LIVER OIL PO Take 2 capsules by mouth daily.   Yes Historical Provider, MD  diphenhydrAMINE (BENADRYL) 25 MG tablet Take 50 mg by mouth 4 (four) times daily.   Yes Historical Provider, MD  Emollient (NIVEA) cream Apply 1 application topically daily. Apply all over body   Yes Historical Provider, MD  EPINEPHrine (EPIPEN) 0.3 mg/0.3 mL DEVI Inject 0.3 mg into the muscle as needed. For allergic reaction   Yes Historical Provider, MD  ferrous sulfate 325 (65 FE) MG tablet Take 325-650 mg by mouth daily with breakfast.   Yes Historical Provider, MD  folic acid (FOLVITE) 937 MCG tablet Take 400 mcg by  mouth daily.   Yes Historical Provider, MD  Garlic Oil 7672 MG CAPS Take 1,000 mg by mouth daily.   Yes Historical Provider, MD  ibuprofen (ADVIL,MOTRIN) 200 MG tablet Take 2,000 mg by mouth 4 (four) times daily. Takes 10 tablets to get 2000mg    Yes Historical Provider, MD  lidocaine (XYLOCAINE) 4 % external solution Apply 5 mLs topically 4 (four) times daily.  05/31/13  Yes Historical Provider, MD  lisinopril (PRINIVIL,ZESTRIL) 10 MG tablet Take 1 tablet (10 mg total) by mouth daily. 08/30/13  Yes Leana Gamer, MD  Multiple Vitamin (MULTIVITAMIN WITH MINERALS) TABS Take 1 tablet by mouth daily. 1  gummy   Yes Historical Provider, MD  Multiple Vitamins-Minerals (ZINC PO) Take 1 tablet by mouth daily.   Yes Historical Provider, MD  Omega-3 Fatty Acids (FISH OIL) 500 MG CAPS Take 1,000 mg by mouth daily.   Yes Historical Provider, MD  oxyCODONE (OXY IR/ROXICODONE) 5 MG immediate release tablet Take 1-2 tablets (5-10 mg total) by mouth every 4 (four) hours as needed. 08/30/13  Yes Leana Gamer, MD  POTASSIUM GLUCONATE PO Take 1 tablet by mouth daily.   Yes Historical Provider, MD  pyridostigmine (MESTINON) 60 MG tablet Take 60 mg by mouth 4 (four) times daily.   Yes Historical Provider, MD   Liver Function Tests  Recent Labs Lab 08/30/13 1605 09/04/13 0819  AST 13 10  ALT 10 9  ALKPHOS 70 84  BILITOT 0.2 <0.2*  PROT 6.9 8.0  ALBUMIN 3.5 3.0*   No results found for this basename: LIPASE, AMYLASE,  in the last 168 hours CBC  Recent Labs Lab 08/30/13 1605 09/04/13 0819  WBC 6.9 10.9*  NEUTROABS 4.8 9.9*  HGB 11.5* 11.6*  HCT 35.0* 35.5*  MCV 68.8* 69.3*  PLT 367 094   Basic Metabolic Panel  Recent Labs Lab 08/30/13 1605 09/04/13 0819  NA 127* 126*  K 4.3 7.0*  CL 100 91*  CO2 14* 9*  GLUCOSE 104* 123*  BUN 44* 92*  CREATININE 2.27* 7.71*  CALCIUM 8.9 8.2*    Filed Vitals:   09/04/13 1300 09/04/13 1315 09/04/13 1400 09/04/13 1515  BP: 123/56 109/54 100/54   Pulse:      Temp:      TempSrc:      Resp: 13 16 16    Height:   5\' 7"  (1.702 m)   Weight:   87.2 kg (192 lb 3.9 oz)   SpO2:    92%   Exam Alert, pleasant, chronically ill appearing, no distress, calm No rash, cyanosis or gangrene Sclera anicteric, throat clear No JVD Chest clear bilat RRR no MRG Abd soft, NTND, no ascites or HSM No LE edema or UE edema Bilat LE's are wrapped from the knees to ankles due to chronic wounds, no apparent edema Neuro is ox 3, nf, no asterixis  UA is negative UNa 60 and UCr 65 Na 133, K 7.0 > repeat 6.3, COP2 8 BUN 86,  Cr 6.77   Ca 7.8   Alb 3.0  LFT's  wnl WBC 10k,  Plts 332k   Hb 11.6   Assessment: 1 Acute on CKD stage IIIa- due to nsaids / ACEi and vol depletion 2 Metabolic acidosis due to severe AKI , may have RTA as well 3 CKD stage IIIa baseline creat is around 1.3 4 Rheumatoid arthritis 5 Myasthenia gravis 6 Chronic LE ulcers   Plan- agree with your management, cont bicarb gtt , bolus 1000 cc saline, kayexalate has  been given, bid BMET's until K down, avoid all nsaids in the future. Will follow. Should recover.   Kelly Splinter MD (pgr) [phone]    ([phone] 09/04/2013, 3:24 PM

## 2013-09-04 NOTE — Consult Note (Signed)
Reason for Consult: Weakness with a history of myasthenia gravis.  HPI:                                                                                                                                          [name] is an 53 y.o. female history of myasthenia gravis, hypertension, congestive heart failure and venous stasis, admitted for acute on chronic renal failure associated with excessive use of nonsteroidal anti-inflammatory medications, as well as ACE inhibitor, who has been experiencing increasing weakness. She's complaining of easy fatiguing as well as shortness of breath. She has not experienced diplopia or ptosis of eyelids. She's had no change in speech or swallowing. She takes Mestinon 60 mg 4 times a day and has been on this dose of medication for quite a long time. Patient was noted to have hyponatremia with sodium 126 as well as hyperkalemia with potassium 7.0. BUN was 92 creatinine 7.71. She has not been febrile.  Past Medical History  Diagnosis Date  . Asthma   . Hypertension   . CHF (congestive heart failure)   . Myasthenia gravis   . Heart murmur   . Venous stasis     Past Surgical History  Procedure Laterality Date  . Tonsillectomy    . Myomectomy    . Failed bilateral leg grafts      Family History  Problem Relation Age of Onset  . Depression    . Cancer      Social History:  reports that she has never smoked. She has never used smokeless tobacco. She reports that she does not drink alcohol or use illicit drugs.  Allergies  Allergen Reactions  . Asa [Aspirin] Shortness Of Breath and Rash  . Dilaudid [Hydromorphone Hcl] Shortness Of Breath and Palpitations  . Erythromycin Other (See Comments)    Burn veins  . Indomethacin Other (See Comments)    Stroke symptoms  . Naproxen Other (See Comments)    Renal failure  . Alginate-Collagen [Wound Dressings] Rash  . Gold-Containing Drug Products Rash  . Silvadene [Silver Sulfadiazine] Rash  . Tylenol  [Acetaminophen] Rash and Other (See Comments)    Salivation and hearing loss    MEDICATIONS:                                                                                                                     I have reviewed  the patient's current medications.   ROS:                                                                                                                                       History obtained from the patient  General ROS: negative for - chills, fatigue, fever, night sweats, weight gain or weight loss Psychological ROS: negative for - behavioral disorder, hallucinations, memory difficulties, mood swings or suicidal ideation Ophthalmic ROS: negative for - blurry vision, double vision, eye pain or loss of vision ENT ROS: negative for - epistaxis, nasal discharge, oral lesions, sore throat, tinnitus or vertigo Allergy and Immunology ROS: negative for - hives or itchy/watery eyes Hematological and Lymphatic ROS: negative for - bleeding problems, bruising or swollen lymph nodes Endocrine ROS: negative for - galactorrhea, hair pattern changes, polydipsia/polyuria or temperature intolerance Respiratory ROS: negative for - cough, hemoptysis, shortness of breath or wheezing Cardiovascular ROS: negative for - chest pain, dyspnea on exertion, edema or irregular heartbeat Gastrointestinal ROS: negative for - abdominal pain, diarrhea, hematemesis, nausea/vomiting or stool incontinence Genito-Urinary ROS: Reduced urine output with acute on chronic renal failure Musculoskeletal ROS: As noted in present illness Neurological ROS: as noted in HPI Dermatological ROS: Positive for venous stasis ulcers bilaterally   Blood pressure 74/42, pulse 101, temperature 97.7 F (36.5 C), temperature source Oral, resp. rate 17, height 5' 7"  (1.702 m), weight 87.2 kg (192 lb 3.9 oz), last menstrual period 07/12/2011, SpO2 100.00%.   Neurologic Examination:                                                                                                       Mental Status: Alert, oriented, thought content appropriate.  Speech fluent without evidence of aphasia. Able to follow commands without difficulty. Cranial Nerves: II-Visual fields were normal. III/IV/VI-Pupils were equal and reacted. Extraocular movements were full and conjugate.    V/VII-no facial numbness and no facial weakness. VIII-normal. X-normal speech and symmetrical palatal movement. Motor: 5/5 bilaterally with normal tone and bulk Sensory: Normal throughout. Deep Tendon Reflexes: 2+ and symmetric in upper extremities and absent at left knee and right and left ankles. Right knee is fused in extension. Plantars: Flexor bilaterally Cerebellar: Normal finger-to-nose testing.  Lab Results  Component Value Date/Time   CHOL  Value: 75        ATP III CLASSIFICATION:  <200     mg/dL   Desirable  200-239  mg/dL   Borderline High  >=240  mg/dL   High        08/03/2008 11:05 AM    Results for orders placed during the hospital encounter of 09/04/13 (from the past 48 hour(s))  CBC WITH DIFFERENTIAL     Status: Abnormal   Collection Time    09/04/13  8:19 AM      Result Value Ref Range   WBC 10.9 (*) 4.0 - 10.5 K/uL   RBC 5.12 (*) 3.87 - 5.11 MIL/uL   Hemoglobin 11.6 (*) 12.0 - 15.0 g/dL   HCT 35.5 (*) 36.0 - 46.0 %   MCV 69.3 (*) 78.0 - 100.0 fL   MCH 22.7 (*) 26.0 - 34.0 pg   MCHC 32.7  30.0 - 36.0 g/dL   RDW 18.7 (*) 11.5 - 15.5 %   Platelets 332  150 - 400 K/uL   Neutrophils Relative % 91 (*) 43 - 77 %   Lymphocytes Relative 3 (*) 12 - 46 %   Monocytes Relative 6  3 - 12 %   Eosinophils Relative 0  0 - 5 %   Basophils Relative 0  0 - 1 %   Neutro Abs 9.9 (*) 1.7 - 7.7 K/uL   Lymphs Abs 0.3 (*) 0.7 - 4.0 K/uL   Monocytes Absolute 0.7  0.1 - 1.0 K/uL   Eosinophils Absolute 0.0  0.0 - 0.7 K/uL   Basophils Absolute 0.0  0.0 - 0.1 K/uL   RBC Morphology POLYCHROMASIA PRESENT    COMPREHENSIVE METABOLIC PANEL      Status: Abnormal   Collection Time    09/04/13  8:19 AM      Result Value Ref Range   Sodium 126 (*) 137 - 147 mEq/L   Comment: REPEATED TO VERIFY   Potassium 7.0 (*) 3.7 - 5.3 mEq/L   Comment: REPEATED TO VERIFY     CRITICAL RESULT CALLED TO, READ BACK BY AND VERIFIED WITH:     HALL,C @ 0930 ON [id number] BY POTEAT,S   Chloride 91 (*) 96 - 112 mEq/L   CO2 9 (*) 19 - 32 mEq/L   Comment: REPEATED TO VERIFY     CRITICAL RESULT CALLED TO, READ BACK BY AND VERIFIED WITH:     HALL,C @ 0930 ON [id number] BY POTEAT,S   Glucose, Bld 123 (*) 70 - 99 mg/dL   BUN 92 (*) 6 - 23 mg/dL   Creatinine, Ser 7.71 (*) 0.50 - 1.10 mg/dL   Calcium 8.2 (*) 8.4 - 10.5 mg/dL   Total Protein 8.0  6.0 - 8.3 g/dL   Albumin 3.0 (*) 3.5 - 5.2 g/dL   AST 10  0 - 37 U/L   ALT 9  0 - 35 U/L   Alkaline Phosphatase 84  39 - 117 U/L   Total Bilirubin <0.2 (*) 0.3 - 1.2 mg/dL   GFR calc non Af Amer 5 (*) >90 mL/min   GFR calc Af Amer 6 (*) >90 mL/min   Comment: (NOTE)     The eGFR has been calculated using the CKD EPI equation.     This calculation has not been validated in all clinical situations.     eGFR's persistently <90 mL/min signify possible Chronic Kidney     Disease.   Anion gap 26 (*) 5 - 15   Comment: REPEATED TO VERIFY  TROPONIN I     Status: None   Collection Time    09/04/13  8:19 AM      Result Value Ref Range   Troponin I <  0.30  <0.30 ng/mL   Comment:            Due to the release kinetics of cTnI,     a negative result within the first hours     of the onset of symptoms does not rule out     myocardial infarction with certainty.     If myocardial infarction is still suspected,     repeat the test at appropriate intervals.  I-STAT CG4 LACTIC ACID, ED     Status: None   Collection Time    09/04/13  8:37 AM      Result Value Ref Range   Lactic Acid, Venous 1.28  0.5 - 2.2 mmol/L  URINALYSIS, ROUTINE W REFLEX MICROSCOPIC     Status: None   Collection Time    09/04/13 11:31 AM      Result Value  Ref Range   Color, Urine YELLOW  YELLOW   APPearance CLEAR  CLEAR   Specific Gravity, Urine 1.011  1.005 - 1.030   pH 5.0  5.0 - 8.0   Glucose, UA NEGATIVE  NEGATIVE mg/dL   Hgb urine dipstick NEGATIVE  NEGATIVE   Bilirubin Urine NEGATIVE  NEGATIVE   Ketones, ur NEGATIVE  NEGATIVE mg/dL   Protein, ur NEGATIVE  NEGATIVE mg/dL   Urobilinogen, UA 0.2  0.0 - 1.0 mg/dL   Nitrite NEGATIVE  NEGATIVE   Leukocytes, UA NEGATIVE  NEGATIVE   Comment: MICROSCOPIC NOT DONE ON URINES WITH NEGATIVE PROTEIN, BLOOD, LEUKOCYTES, NITRITE, OR GLUCOSE <1000 mg/dL.  SODIUM, URINE, RANDOM     Status: None   Collection Time    09/04/13 11:31 AM      Result Value Ref Range   Sodium, Ur 60     Comment: Performed at [place], URINE, RANDOM     Status: None   Collection Time    09/04/13 11:31 AM      Result Value Ref Range   Creatinine, Urine 64.84     Comment: Performed at [facility], URINE     Status: Abnormal   Collection Time    09/04/13 11:31 AM      Result Value Ref Range   Osmolality, Ur 286 (*) 390 - 1090 mOsm/kg   Comment: Performed at [facility] PCR SCREENING     Status: Abnormal   Collection Time    09/04/13  2:30 PM      Result Value Ref Range   MRSA by PCR POSITIVE (*) NEGATIVE   Comment:            The GeneXpert MRSA Assay (FDA     approved for NASAL specimens     only), is one component of a     comprehensive MRSA colonization     surveillance program. It is not     intended to diagnose MRSA     infection nor to guide or     monitor treatment for     MRSA infections.     RESULT CALLED TO, READ BACK BY AND VERIFIED WITH:     BULLINS,H AT 1655 ON 09/04/13 BY INGLEE   BASIC METABOLIC PANEL     Status: Abnormal   Collection Time    09/04/13  3:25 PM      Result Value Ref Range   Sodium 133 (*) 137 - 147 mEq/L   Comment: DELTA CHECK NOTED   Potassium 6.3 (*) 3.7 - 5.3 mEq/L   Chloride  96  96 - 112 mEq/L   CO2 8 (*) 19 -  32 mEq/L   Comment: CRITICAL RESULT CALLED TO, READ BACK BY AND VERIFIED WITH:     ALDRIDGE,D @ 1600 ON [id number] BY POTEAT,S   Glucose, Bld 93  70 - 99 mg/dL   BUN 86 (*) 6 - 23 mg/dL   Creatinine, Ser 6.77 (*) 0.50 - 1.10 mg/dL   Calcium 7.8 (*) 8.4 - 10.5 mg/dL   GFR calc non Af Amer 6 (*) >90 mL/min   GFR calc Af Amer 7 (*) >90 mL/min   Comment: (NOTE)     The eGFR has been calculated using the CKD EPI equation.     This calculation has not been validated in all clinical situations.     eGFR's persistently <90 mL/min signify possible Chronic Kidney     Disease.   Anion gap 29 (*) 5 - 15    Dg Chest 2 View  09/04/2013   CLINICAL DATA:  Shortness of breath.  Wheezing.  EXAM: CHEST  2 VIEW  COMPARISON:  10/21/2010  FINDINGS: Heart size and pulmonary vascularity are normal. No infiltrates or effusions. Slight peribronchial thickening consistent with bronchitis. No acute osseous abnormality.  IMPRESSION: Bronchitic changes.   Electronically Signed   By: Rozetta Nunnery M.D.   On: 09/04/2013 08:51   US Renal  09/04/2013   CLINICAL DATA:  Elevated renal function tests  EXAM: RENAL/URINARY TRACT ULTRASOUND COMPLETE  COMPARISON:  None.  FINDINGS: Right Kidney:  Length: 11.8 cm. Increased renal cortical echogenicity. No mass or hydronephrosis visualized.  Left Kidney:  Length: 10.5 cm. Increased renal cortical echogenicity. No mass or hydronephrosis visualized.  Bladder:  Appears normal for degree of bladder distention.  IMPRESSION: 1. No urolithiasis or obstructive uropathy. 2. Increased renal cortical echogenicity as can be seen with medical renal disease versus AKI.   Electronically Signed   By: Kathreen Devoid   On: 09/04/2013 10:47     Assessment/Plan: 53 year old lady with myasthenia gravis, well-controlled on Mestinon 60 mg 4 times a day, presenting with generalized weakness, which is most likely a manifestation of metabolic abnormalities, including acute on chronic kidney failure as well as  hyponatremia. Patient has no specific clinical signs of exacerbation of myasthenia gravis.  Recommendations: 1. Management of renal failure and hyponatremia as planned, with anticipated improvement in generalized weakness as patient's metabolic status improves. 2. No change in management of myasthenia gravis with 60 mg of Mestinon 4 times a day. 3. No specific neurodiagnostic studies are indicated. 4. No further neurological intervention is indicated at this point. We'll remain available for followup evaluation, however, on prn basis  C.R. Nicole Kindred, MD [facility] [phone]  09/04/2013, 8:57 PM

## 2013-09-04 NOTE — ED Notes (Signed)
Changed oxygen probe.  Sats 100% on 2l per [place]

## 2013-09-04 NOTE — Progress Notes (Signed)
Patient with large amount of stool post Kayexalate.

## 2013-09-04 NOTE — Plan of Care (Signed)
[name] Najjar, NP paged for continued hypotension (currently 70s/40s). Pt remains asymptomatic. Pulses appropriate, cap refill slightly delayed (3-4 seconds). Will continue to closely monitor.

## 2013-09-04 NOTE — ED Notes (Signed)
Per EMS, pt from home.  Has had shortness of breath for "a while".  Pt states she was supposed to be checked today at [facility] for CHF.  Pt was started on "fluid pill" and couldn't take it b/c it made "her go blind" all weekend.  Sats: 96 % RA on EMS arrival.  Decreased to 92 % with exertion.  Pt also states she has not urinated since yesterday early evening.  Pt is alert and oriented.  Pt has bilateral skin wounds with dressings.  Pt states that is chronic and that MD cannot figure out why it is that way.  Vitals:  94/56 (normal for her), hr 110, resp 18, ending sats 97%.  20 g in RAC.

## 2013-09-04 NOTE — ED Notes (Signed)
Bladder Scan 17cc  

## 2013-09-04 NOTE — ED Notes (Signed)
Bed: WA10 Expected date:  Expected time:  Means of arrival:  Comments: EMS-SOB 

## 2013-09-05 ENCOUNTER — Encounter (HOSPITAL_COMMUNITY): Payer: Self-pay | Admitting: Pulmonary Disease

## 2013-09-05 ENCOUNTER — Inpatient Hospital Stay (HOSPITAL_COMMUNITY): Payer: Medicare Other

## 2013-09-05 DIAGNOSIS — E869 Volume depletion, unspecified: Secondary | ICD-10-CM | POA: Diagnosis not present

## 2013-09-05 DIAGNOSIS — E872 Acidosis, unspecified: Secondary | ICD-10-CM

## 2013-09-05 DIAGNOSIS — E876 Hypokalemia: Secondary | ICD-10-CM | POA: Diagnosis not present

## 2013-09-05 DIAGNOSIS — Z452 Encounter for adjustment and management of vascular access device: Secondary | ICD-10-CM | POA: Diagnosis not present

## 2013-09-05 DIAGNOSIS — E875 Hyperkalemia: Secondary | ICD-10-CM | POA: Diagnosis not present

## 2013-09-05 DIAGNOSIS — A419 Sepsis, unspecified organism: Secondary | ICD-10-CM | POA: Diagnosis not present

## 2013-09-05 DIAGNOSIS — N186 End stage renal disease: Secondary | ICD-10-CM | POA: Diagnosis not present

## 2013-09-05 DIAGNOSIS — N179 Acute kidney failure, unspecified: Secondary | ICD-10-CM | POA: Diagnosis not present

## 2013-09-05 DIAGNOSIS — R6521 Severe sepsis with septic shock: Secondary | ICD-10-CM

## 2013-09-05 DIAGNOSIS — R6520 Severe sepsis without septic shock: Secondary | ICD-10-CM

## 2013-09-05 LAB — BASIC METABOLIC PANEL
ANION GAP: 17 — AB (ref 5–15)
ANION GAP: 22 — AB (ref 5–15)
Anion gap: 19 — ABNORMAL HIGH (ref 5–15)
BUN: 60 mg/dL — ABNORMAL HIGH (ref 6–23)
BUN: 69 mg/dL — ABNORMAL HIGH (ref 6–23)
BUN: 81 mg/dL — ABNORMAL HIGH (ref 6–23)
CALCIUM: 6.4 mg/dL — AB (ref 8.4–10.5)
CALCIUM: 6.6 mg/dL — AB (ref 8.4–10.5)
CO2: 13 mEq/L — ABNORMAL LOW (ref 19–32)
CO2: 17 mEq/L — ABNORMAL LOW (ref 19–32)
CO2: 21 mEq/L (ref 19–32)
CREATININE: 3.71 mg/dL — AB (ref 0.50–1.10)
CREATININE: 2.76 mg/dL — AB (ref 0.50–1.10)
Calcium: 6.4 mg/dL — CL (ref 8.4–10.5)
Chloride: 101 mEq/L (ref 96–112)
Chloride: 103 mEq/L (ref 96–112)
Chloride: 98 mEq/L (ref 96–112)
Creatinine, Ser: 5.01 mg/dL — ABNORMAL HIGH (ref 0.50–1.10)
GFR calc Af Amer: 10 mL/min — ABNORMAL LOW (ref >90)
GFR calc Af Amer: 15 mL/min — ABNORMAL LOW (ref >90)
GFR calc Af Amer: 21 mL/min — ABNORMAL LOW (ref >90)
GFR calc non Af Amer: 18 mL/min — ABNORMAL LOW (ref >90)
GFR, EST NON AFRICAN AMERICAN: 13 mL/min — AB (ref >90)
GFR, EST NON AFRICAN AMERICAN: 9 mL/min — AB (ref >90)
GLUCOSE: 104 mg/dL — AB (ref 70–99)
Glucose, Bld: 120 mg/dL — ABNORMAL HIGH (ref 70–99)
Glucose, Bld: 121 mg/dL — ABNORMAL HIGH (ref 70–99)
Potassium: 2.4 mEq/L — CL (ref 3.7–5.3)
Potassium: 3 mEq/L — ABNORMAL LOW (ref 3.7–5.3)
Potassium: 4 mEq/L (ref 3.7–5.3)
SODIUM: 137 meq/L (ref 137–147)
Sodium: 136 mEq/L — ABNORMAL LOW (ref 137–147)
Sodium: 138 mEq/L (ref 137–147)

## 2013-09-05 LAB — CBC
HCT: 27.2 % — ABNORMAL LOW (ref 36.0–46.0)
Hemoglobin: 9.2 g/dL — ABNORMAL LOW (ref 12.0–15.0)
MCH: 22.8 pg — ABNORMAL LOW (ref 26.0–34.0)
MCHC: 33.8 g/dL (ref 30.0–36.0)
MCV: 67.5 fL — ABNORMAL LOW (ref 78.0–100.0)
PLATELETS: 212 10*3/uL (ref 150–400)
RBC: 4.03 MIL/uL (ref 3.87–5.11)
RDW: 18.3 % — AB (ref 11.5–15.5)
WBC: 10.7 10*3/uL — AB (ref 4.0–10.5)

## 2013-09-05 LAB — PROTIME-INR
INR: 1.23 (ref 0.00–1.49)
PROTHROMBIN TIME: 15.5 s — AB (ref 11.6–15.2)

## 2013-09-05 LAB — T3, FREE: T3, Free: 2 pg/mL — ABNORMAL LOW (ref 2.3–4.2)

## 2013-09-05 LAB — MAGNESIUM: MAGNESIUM: 1.9 mg/dL (ref 1.5–2.5)

## 2013-09-05 LAB — LACTIC ACID, PLASMA: LACTIC ACID, VENOUS: 1.9 mmol/L (ref 0.5–2.2)

## 2013-09-05 LAB — TSH: TSH: 0.216 u[IU]/mL — ABNORMAL LOW (ref 0.350–4.500)

## 2013-09-05 LAB — T4, FREE: Free T4: 0.89 ng/dL (ref 0.80–1.80)

## 2013-09-05 LAB — PROCALCITONIN: PROCALCITONIN: 1.19 ng/mL

## 2013-09-05 MED ORDER — PHENYLEPHRINE HCL 10 MG/ML IJ SOLN
30.0000 ug/min | INTRAVENOUS | Status: DC
  Administered 2013-09-05: 90 ug/min via INTRAVENOUS
  Administered 2013-09-05: 100 ug/min via INTRAVENOUS
  Administered 2013-09-06: 50 ug/min via INTRAVENOUS
  Filled 2013-09-05 (×4): qty 4

## 2013-09-05 MED ORDER — POTASSIUM CHLORIDE CRYS ER 20 MEQ PO TBCR
40.0000 meq | EXTENDED_RELEASE_TABLET | Freq: Once | ORAL | Status: AC
Start: 2013-09-05 — End: 2013-09-05
  Administered 2013-09-05: 40 meq via ORAL
  Filled 2013-09-05: qty 2

## 2013-09-05 MED ORDER — SODIUM CHLORIDE 0.9 % IV BOLUS (SEPSIS)
1000.0000 mL | Freq: Once | INTRAVENOUS | Status: AC
  Administered 2013-09-05: 1000 mL via INTRAVENOUS

## 2013-09-05 MED ORDER — VANCOMYCIN HCL IN DEXTROSE 1-5 GM/200ML-% IV SOLN
1000.0000 mg | INTRAVENOUS | Status: DC
  Administered 2013-09-05: 1000 mg via INTRAVENOUS
  Filled 2013-09-05: qty 200

## 2013-09-05 MED ORDER — POTASSIUM CHLORIDE 10 MEQ/50ML IV SOLN
10.0000 meq | INTRAVENOUS | Status: AC
  Administered 2013-09-05 (×2): 10 meq via INTRAVENOUS
  Filled 2013-09-05 (×2): qty 50

## 2013-09-05 MED ORDER — MAGNESIUM SULFATE IN D5W 10-5 MG/ML-% IV SOLN
1.0000 g | Freq: Once | INTRAVENOUS | Status: AC
  Administered 2013-09-05: 1 g via INTRAVENOUS
  Filled 2013-09-05: qty 100

## 2013-09-05 MED ORDER — CHLORHEXIDINE GLUCONATE CLOTH 2 % EX PADS
6.0000 | MEDICATED_PAD | Freq: Every day | CUTANEOUS | Status: AC
  Administered 2013-09-06 – 2013-09-09 (×4): 6 via TOPICAL

## 2013-09-05 MED ORDER — COLLAGENASE 250 UNIT/GM EX OINT
TOPICAL_OINTMENT | Freq: Every day | CUTANEOUS | Status: DC
  Administered 2013-09-05: 15:00:00 via TOPICAL
  Administered 2013-09-06: 1 via TOPICAL
  Administered 2013-09-07 – 2013-09-09 (×3): via TOPICAL
  Filled 2013-09-05: qty 30

## 2013-09-05 MED ORDER — PHENYLEPHRINE HCL 10 MG/ML IJ SOLN
30.0000 ug/min | INTRAVENOUS | Status: DC
  Administered 2013-09-05 (×2): 80 ug/min via INTRAVENOUS
  Administered 2013-09-05: 10 ug/min via INTRAVENOUS
  Filled 2013-09-05 (×2): qty 1

## 2013-09-05 MED ORDER — POTASSIUM CHLORIDE CRYS ER 20 MEQ PO TBCR
40.0000 meq | EXTENDED_RELEASE_TABLET | Freq: Two times a day (BID) | ORAL | Status: DC
  Administered 2013-09-05 (×2): 40 meq via ORAL
  Filled 2013-09-05: qty 2

## 2013-09-05 MED ORDER — ONDANSETRON HCL 4 MG/2ML IJ SOLN
4.0000 mg | Freq: Four times a day (QID) | INTRAMUSCULAR | Status: DC | PRN
  Administered 2013-09-05 – 2013-09-06 (×2): 4 mg via INTRAVENOUS
  Filled 2013-09-05 (×2): qty 2

## 2013-09-05 MED ORDER — SODIUM CHLORIDE 0.9 % IV SOLN
1.0000 g | Freq: Once | INTRAVENOUS | Status: AC
  Administered 2013-09-05: 1 g via INTRAVENOUS
  Filled 2013-09-05: qty 10

## 2013-09-05 MED ORDER — SODIUM CHLORIDE 0.9 % IV BOLUS (SEPSIS)
1000.0000 mL | INTRAVENOUS | Status: AC
  Administered 2013-09-05 (×2): 1000 mL via INTRAVENOUS

## 2013-09-05 NOTE — Plan of Care (Signed)
[name] Najjar, NP paged by this RN to discuss persistent hypotension and new management strategies. Medical team would like to initiate neo-synephrine drip to maintain MAP >60.

## 2013-09-05 NOTE — Plan of Care (Signed)
Baltazar Najjar, NP paged in regards to persistent hypotension.

## 2013-09-05 NOTE — Plan of Care (Signed)
Blood pressures beginning to improve on 59mcg /min of neo-synephrine. Titrating for a goal MAP>60. Patient has remained neurologically appropriate despite the persistent hypotension earlier in the shift.   Baltazar Najjar, NP asked that neo-synephrine be run through the pre-existing PIV. She did not wish to have a central line placed overnight.

## 2013-09-05 NOTE — Progress Notes (Signed)
eLink Physician-Brief Progress Note Patient Name: [name] DOB: [birth year] MRN: [id number]   Date of Service  09/05/2013  HPI/Events of Note   Nausea with vomiting.   eICU Interventions  PRN zofran.     Intervention Category Major Interventions: Other:  [name] 09/05/2013, 9:21 PM

## 2013-09-05 NOTE — Progress Notes (Signed)
Pt continues to have low MAP and low BP in spite of multiple IVF boluses and bicarb drip at 125cc/hr. Discussed this with Dr. Benny Lennert with [facility] who agreed with starting pressors. Neo started to be titrated to MAP of 60. Baltazar Najjar, NP

## 2013-09-05 NOTE — Consult Note (Signed)
WOC wound consult note Reason for Consult: Chronic venous insufficiency ulcerations, L>R but both circumferential and severe.  Patient has failed numerous interventions in a period of greater than 5 years including Dermagraft and other skin substitutes both locally and at [facility] in [place]. She performs self-care at home at this time despite having been followed by several local outpatient wound care centers. She foes not have HHRN to assist. Wound type:Venous insufficiency Pressure Ulcer POA: No Measurement:Left: 15cm x 30 cm x 0.2cm. Two areas of necrotic tissue measuring 6cm x 10cm and 3cm x 5cm.    Right :  12cm x 16cm x 0.2cm Wound bed:Red, wet, non-granulating Drainage (amount, consistency, odor) Large amounts of serous exudate Periwound: caked with zinc oxide from months of application. Dressing procedure/placement/frequency: I will apply xeroform gauze as a wound contact layer and use a calcium alginate on top of that to assist with moisture management. We will apply an enzymatic debriding agent to the necrotic tissue.  This is an uncharacteristic use for the calcium alginate dressing, but patient cannot tolerate pain if dressing becomes adherent.  We will top that with ABD pads and secure with Kerlix.  LEs are to be elevated while in bed and in chair.  I suggest vascular consult is indicated at patient states she does not currently have or see a vascular physician.  If you agree, please order. Additionally, I believe that this patient will require the assistance of a [facility] upon discharge.  Again, if you agree, please order. [facility] nursing team will not follow, but will remain available to this patient, the nursing and medical teams.  Please re-consult if needed. Thanks, Maudie Flakes, MSN, RN, [place], [place], [place] [phone])

## 2013-09-05 NOTE — Progress Notes (Signed)
ANTIBIOTIC CONSULT NOTE - INITIAL  Pharmacy Consult for Vancomycin Indication: rule out sepsis  Allergies  Allergen Reactions  . Asa [Aspirin] Shortness Of Breath and Rash  . Dilaudid [Hydromorphone Hcl] Shortness Of Breath and Palpitations  . Erythromycin Other (See Comments)    Burn veins  . Indomethacin Other (See Comments)    Stroke symptoms  . Naproxen Other (See Comments)    Renal failure  . Alginate-Collagen [Wound Dressings] Rash  . Gold-Containing Drug Products Rash  . Silvadene [Silver Sulfadiazine] Rash  . Tylenol [Acetaminophen] Rash and Other (See Comments)    Salivation and hearing loss    Patient Measurements: Height: 5\' 7"  (170.2 cm) Weight: 192 lb 3.9 oz (87.2 kg) IBW/kg (Calculated) : 61.6  Vital Signs: Temp: 99.1 F (37.3 C) (08/12 0800) Temp src: Oral (08/12 0800) BP: 85/33 mmHg (08/12 1130) Pulse Rate: 106 (08/12 1130) Intake/Output from previous day: 08/11 0701 - 08/12 0700 In: 6209 [P.O.:300; I.V.:2067.3; IV Piggyback:3841.7] Out: 1477 [Urine:1475; Stool:2] Intake/Output from this shift: Total I/O In: 1160 [P.O.:200; I.V.:860; IV Piggyback:100] Out: 550 [Urine:550]  Labs:  Recent Labs  09/04/13 0819 09/04/13 1131 09/04/13 1525 09/04/13 2337 09/05/13 0545  WBC 10.9*  --   --   --  10.7*  HGB 11.6*  --   --   --  9.2*  PLT 332  --   --   --  212  LABCREA  --  64.84  --   --   --   CREATININE 7.71*  --  6.77* 5.01* 3.71*   Estimated Creatinine Clearance: 19.6 ml/min (by C-G formula based on Cr of 3.71). No results found for this basename: VANCOTROUGH, [name], VANCORANDOM, Hend[place], GENTPEAK, GENTRANDOM, TOBRATROUGH, TOBRAPEAK, TOBRARND, AMIKACINPEAK, AMIKACINTROU, AMIKACIN,  in the last 72 hours   Microbiology: Recent Results (from the past 720 hour(s))  MRSA PCR SCREENING     Status: Abnormal   Collection Time    09/04/13  2:30 PM      Result Value Ref Range Status   MRSA by PCR POSITIVE (*) NEGATIVE Final   Comment:             The GeneXpert MRSA Assay (FDA     approved for NASAL specimens     only), is one component of a     comprehensive MRSA colonization     surveillance program. It is not     intended to diagnose MRSA     infection nor to guide or     monitor treatment for     MRSA infections.     RESULT CALLED TO, READ BACK BY AND VERIFIED WITH:     BULLINS,H AT 0814 ON 09/04/13 BY INGLEE     Medical History: Past Medical History  Diagnosis Date  . Asthma   . Hypertension   . CHF (congestive heart failure)   . Myasthenia gravis   . Heart murmur   . Venous stasis   . DVT (deep venous thrombosis) 2000, 2004    Not on anticoagulation     Medications:  Anti-infectives   None     Assessment: 41 yoF admitted 8/11 with progressive weakness, SOB, leg pain.  PMH includes myasthenia gravis, HTN, CHF, RA, venous stasis wounds, DVT.  She presents with AoCKD associated with excessive NSAID use (up to 40 tabs of ibuprofen daily) as well as ACEI (lisinopril).  Initial SCr up to 7.7, but improved to 3.71 today.  She is now hypotensive, requiring pressors.  She has chronic LE wounds  w/o evidence of acute infection but cannot r/o sepsis from wounds.  Pharmacy is consulted to dose Vancomycin.  Tmax: 99.1  WBCs: 10.7  Renal: SCr 3.71, CrCl ~ 19.6 ml/min  Blood cultures sent   Goal of Therapy:  Vancomycin trough level 15-20 mcg/ml  Plan:   Vancomycin 1g IV q48h.  Measure Vanc trough at steady state.  Follow up renal fxn and culture results.    Gretta Arab PharmD, BCPS Pager [phone] 09/05/2013 11:55 AM

## 2013-09-05 NOTE — Progress Notes (Signed)
CRITICAL VALUE ALERT  Critical value received: K+ 2.4, Ca+ 6.4  Date of notification: 09/05/2013  Time of notification:  1305  Critical value read back:yes  Nurse who received alert:  Lacinda Axon RN  MD notified (1st page):  Noe Gens NP  Time of first page:  1315  MD notified (2nd page):n/a  Time of second page:n/a  Responding MD:  Noe Gens NP  Time MD responded:  [phone]

## 2013-09-05 NOTE — Plan of Care (Addendum)
Baltazar Najjar, NP returned page- will give 1000 mL bolus of NS over 2 hours and reassess BP.

## 2013-09-05 NOTE — Progress Notes (Signed)
  [name] Progress Note   Subjective: No complaints, creat down to 2.6  Filed Vitals:   09/05/13 1445 09/05/13 1500 09/05/13 1515 09/05/13 1530  BP: 77/42 96/36 90/44  100/39  Pulse: 101 96 95 92  Temp:      TempSrc:      Resp: 28 14 15 14   Height:      Weight:      SpO2: 100% 100% 100% 100%   Exam: Alert, pleasant, chronically ill appearing, no distress, calm  No JVD  Chest clear bilat  RRR no MRG  Abd soft, NTND, no ascites or HSM  No LE edema or UE edema  Bilat LE's are wrapped from the knees to ankles due to chronic wounds, no edema  Neuro is ox 3, nf, no asterixis   UA  negative  UNa 60 and UCr 65   Assessment:  1 Acute on CKD stage IIIa- due to nsaids, acei's and extreme vol depletion. Improving with IVF's and holding meds  2 Metabolic acidosis - better 3 CKD stage IIIa baseline creat is around 1.3  4 Rheumatoid arthritis  5 Myasthenia gravis  6 Chronic LE ulcers   Plan- should continue to improve, will sign off -- please call as needed.     Kelly Splinter MD  pager [phone]    cell [phone]  09/05/2013, 3:53 PM     Recent Labs Lab 09/04/13 2337 09/05/13 0545 09/05/13 1107  NA 138 137 136*  K 4.0 3.0* 2.4*  CL 103 101 98  CO2 13* 17* 21  GLUCOSE 120* 104* 121*  BUN 81* 69* 60*  CREATININE 5.01* 3.71* 2.76*  CALCIUM 6.6* 6.4* 6.4*    Recent Labs Lab 08/30/13 1605 09/04/13 0819  AST 13 10  ALT 10 9  ALKPHOS 70 84  BILITOT 0.2 <0.2*  PROT 6.9 8.0  ALBUMIN 3.5 3.0*    Recent Labs Lab 08/30/13 1605 09/04/13 0819 09/05/13 0545  WBC 6.9 10.9* 10.7*  NEUTROABS 4.8 9.9*  --   HGB 11.5* 11.6* 9.2*  HCT 35.0* 35.5* 27.2*  MCV 68.8* 69.3* 67.5*  PLT 367 332 212   . calcium gluconate  1 g Intravenous Once  . [START ON 09/06/2013] Chlorhexidine Gluconate Cloth  6 each Topical Q0600  . collagenase   Topical Daily  . heparin  5,000 Units Subcutaneous 3 times per day  . magnesium sulfate 1 - 4 g bolus IVPB  1 g Intravenous  Once  . mupirocin ointment  1 application Nasal BID  . potassium chloride  40 mEq Oral BID  . pyridostigmine  60 mg Oral QID  . sodium chloride  250 mL Intravenous Once  . sodium chloride  500 mL Intravenous Once  . sodium chloride  3 mL Intravenous Q12H  . vancomycin  1,000 mg Intravenous Q48H   . phenylephrine (NEO-SYNEPHRINE) Adult infusion 100 mcg/min (09/05/13 1500)  .  sodium bicarbonate  infusion 1000 mL 125 mL/hr at 09/05/13 1027   HYDROcodone-acetaminophen, oxyCODONE

## 2013-09-05 NOTE — Progress Notes (Signed)
Called by RN regarding low potassium.  Lab data reviewed.  Currently on bicarbonate gtt  Plan: 1gm Mag now KCL 40 mEq BID x3 doses KCL 20 mEq IV 1 gm Calcium gluconate now Follow up labs pending Remains on pressors, assess cortisol   Noe Gens, NP-[facility] Pgr: [phone] or [phone]

## 2013-09-05 NOTE — Plan of Care (Signed)
[name] Najjar, NP paged for critical value: Calcium 6.4. Awaiting response.

## 2013-09-05 NOTE — Progress Notes (Signed)
CARE MANAGEMENT NOTE 09/05/2013  Patient:  Sumler,Milessa   Account Number:  [id number]  Date Initiated:  09/05/2013  Documentation initiated by:  DAVIS,RHONDA  Subjective/Objective Assessment:   53 y/o F with a PMH of HTN, CHF, Asthma, Myasthenia Gravis admitted on 8/11 with complaints of SOB/Wheezing, increased LE pain taking up to 40 tabs of ibuprofen QD.  ER work up found the patient to have acute kidney injury, metabolic acidos     Action/Plan:   home with hhc   Anticipated DC Date:  09/08/2013   Anticipated DC Plan:  [place]  In-house referral  NA      DC Planning Services  CM consult      [place] Choice  Resumption Of Svcs/PTA Provider   Choice offered to / List presented to:  NA   DME arranged  NA      DME agency  NA     [place] arranged  NA      [place]   Status of service:  In process, will continue to follow Medicare Important Message given?  NA - LOS <3 / Initial given by admissions (If response is "NO", the following Medicare IM given date fields will be blank) Date Medicare IM given:   Medicare IM given by:   Date Additional Medicare IM given:   Additional Medicare IM given by:    Discharge Disposition:    Per UR Regulation:  Reviewed for med. necessity/level of care/duration of stay  If discussed at [place] of Stay Meetings, dates discussed:    Comments:  Suanne Marker Davis,RN,BSN,CCM

## 2013-09-05 NOTE — Progress Notes (Signed)
[facility] PROGRESS NOTE  [name] DVV:[id number] DOB: 1960-03-17 DOA: 09/04/2013 PCP: MATTHEWS,MICHELLE A., MD   Brief narrative 53 year old female with history of myasthenia gravis on Mestinon (follows with Dr. Krista Blue), chronic lower extremity venous stasis ulcers for past 10 years, hypertension, history of rheumatoid arthritis history of DVT, asthma, who presented to the ED with shortness of breath and wheezing. Patient was found to be in acute kidney injury with markedly elevated BUN and creatinine of 92/7.71 respectively with anion gap metabolic acidosis and hypocalcemia.Marland Kitchen She was also hyponatremic with sodium of 126 and potassium of 7. Apparently patient was taking NSAIDs regularly and was also on lisinopril/HCTZ for her blood pressure. Patient admitted to step down. Overnight on admission patient became hypotensive with systolic blood pressure in the 60s and low MEP despite multiple IV fluid boluses and been on bicarbonate drip. She was started on Neo-Synephrine.   Assessment/Plan: Septic shock Likely secondary to hypovolemia with dehydration and acute kidney injury. Patient  requiring pressors with Neo-Synephrine drip. Patient had a central line placed by [facility] M this morning and may need A-line for accurate blood pressure reading.   Continue bicarbonate drip. Lactate normal. Follow blood culture and cortisol level. Check procalcitonin level.  Acute kidney injury Likely in the setting of dehydration and use of NSAIDs along with HCTZ and lisinopril. Have held all the medications. Receiving IV fluids with bicarbonate. Appreciate renal recommendations. He'll function slowly improving and has good urinary output.  Hypocalcemia Replenishing  with IV calcium gluconate. Normal magnesium level. Replace low k today  Hyperkalemia Possibly related to ACEi and NSAIDs. Given Kayexalate and potassium of 3 today.replace kcl today.  Chronic venous stasis ulcers Does not appear infected  clinically. Wound care consult placed. Started on empiric vancomycin by [facility].  Low TSH Check free T3 and T4.  Myasthenia gravis Appears stable. Seen by neurology and recommended to continue Mestinon.  History of rheumatoid arthritis Currently stable. Reports planning to follow up with rheumatology as outpatient.  DVT prophylaxis: Subcutaneous heparin  GI prophylaxis: PPI  Diet: Regular   Code Status: Full code Family Communication: None at bedside  Disposition Plan: Currently in stepdown. PT eval once stable   Consultants:  PCCM   renal   neurology  Procedures:  Central line placed 8/12  Antibiotics:  IV vancomycin 8/12  HPI/Subjective: Patient seen and examined this morning. Noted for low blood pressure overnight did not improve with IV bolus and and requiring pressors with Neo-Synephrine. Reports having chills.  Objective: Filed Vitals:   09/05/13 1130  BP: 85/33  Pulse: 106  Temp:   Resp: 16    Intake/Output Summary (Last 24 hours) at 09/05/13 1436 Last data filed at 09/05/13 1100  Gross per 24 hour  Intake 7266.5 ml  Output   2027 ml  Net 5239.5 ml   Filed Weights   09/04/13 1400  Weight: 87.2 kg (192 lb 3.9 oz)    Exam:   General:  Middle-aged female lying in bed in no acute distress  HEENT: No pallor, moist oral mucosa, no icterus  Chest: Clear to auscultation bilaterally, no added sounds  Abdomen: Soft, nontender, nondistended, bowel sounds present  Extremities: Warm, bilateral chronic venous stasis ulcers, does not appear infected  CNS: Alert and oriented.  Data Reviewed: Basic Metabolic Panel:  Recent Labs Lab 08/30/13 1605 09/04/13 0819 09/04/13 1525 09/04/13 2337 09/05/13 0545 09/05/13 1107  NA 127* 126* 133* 138 137 136*  K 4.3 7.0* 6.3* 4.0 3.0* 2.4*  CL 100  91* 96 103 101 98  CO2 14* 9* 8* 13* 17* 21  GLUCOSE 104* 123* 93 120* 104* 121*  BUN 44* 92* 86* 81* 69* 60*  CREATININE 2.27* 7.71* 6.77* 5.01* 3.71*  2.76*  CALCIUM 8.9 8.2* 7.8* 6.6* 6.4* 6.4*  MG 2.4  --   --   --  1.9  --    Liver Function Tests:  Recent Labs Lab 08/30/13 1605 09/04/13 0819  AST 13 10  ALT 10 9  ALKPHOS 70 84  BILITOT 0.2 <0.2*  PROT 6.9 8.0  ALBUMIN 3.5 3.0*   No results found for this basename: LIPASE, AMYLASE,  in the last 168 hours No results found for this basename: AMMONIA,  in the last 168 hours CBC:  Recent Labs Lab 08/30/13 1605 09/04/13 0819 09/05/13 0545  WBC 6.9 10.9* 10.7*  NEUTROABS 4.8 9.9*  --   HGB 11.5* 11.6* 9.2*  HCT 35.0* 35.5* 27.2*  MCV 68.8* 69.3* 67.5*  PLT 367 332 212   Cardiac Enzymes:  Recent Labs Lab 09/04/13 0819  TROPONINI <0.30   BNP (last 3 results) No results found for this basename: PROBNP,  in the last 8760 hours CBG: No results found for this basename: GLUCAP,  in the last 168 hours  Recent Results (from the past 240 hour(s))  MRSA PCR SCREENING     Status: Abnormal   Collection Time    09/04/13  2:30 PM      Result Value Ref Range Status   MRSA by PCR POSITIVE (*) NEGATIVE Final   Comment:            The GeneXpert MRSA Assay (FDA     approved for NASAL specimens     only), is one component of a     comprehensive MRSA colonization     surveillance program. It is not     intended to diagnose MRSA     infection nor to guide or     monitor treatment for     MRSA infections.     RESULT CALLED TO, READ BACK BY AND VERIFIED WITH:     BULLINS,H AT 1655 ON 09/04/13 BY INGLEE      Studies: Dg Chest 2 View  09/04/2013   CLINICAL DATA:  Shortness of breath.  Wheezing.  EXAM: CHEST  2 VIEW  COMPARISON:  10/21/2010  FINDINGS: Heart size and pulmonary vascularity are normal. No infiltrates or effusions. Slight peribronchial thickening consistent with bronchitis. No acute osseous abnormality.  IMPRESSION: Bronchitic changes.   Electronically Signed   By: Rozetta Nunnery M.D.   On: 09/04/2013 08:51   US Renal  09/04/2013   CLINICAL DATA:  Elevated renal  function tests  EXAM: RENAL/URINARY TRACT ULTRASOUND COMPLETE  COMPARISON:  None.  FINDINGS: Right Kidney:  Length: 11.8 cm. Increased renal cortical echogenicity. No mass or hydronephrosis visualized.  Left Kidney:  Length: 10.5 cm. Increased renal cortical echogenicity. No mass or hydronephrosis visualized.  Bladder:  Appears normal for degree of bladder distention.  IMPRESSION: 1. No urolithiasis or obstructive uropathy. 2. Increased renal cortical echogenicity as can be seen with medical renal disease versus AKI.   Electronically Signed   By: Kathreen Devoid   On: 09/04/2013 10:47   Dg Chest Port 1 View  09/05/2013   CLINICAL DATA:  Central venous catheter placement.  EXAM: PORTABLE CHEST - 1 VIEW  COMPARISON:  Radiographs 11/2013 and 10/21/2010.  FINDINGS: 1121 hr. Right IJ central venous catheter extends to the level of  the SVC right atrial junction. There are lower lung volumes with mildly increased bibasilar atelectasis. No pneumothorax, confluent airspace opacity or significant pleural effusion is identified. The heart size and mediastinal contours are stable.  IMPRESSION: Central venous catheter placement as described without complicating pneumothorax. Mild increase in bibasilar atelectasis.   Electronically Signed   By: Camie Patience M.D.   On: 09/05/2013 12:01    Scheduled Meds: . calcium gluconate  1 g Intravenous Once  . [START ON 09/06/2013] Chlorhexidine Gluconate Cloth  6 each Topical Q0600  . collagenase   Topical Daily  . heparin  5,000 Units Subcutaneous 3 times per day  . magnesium sulfate 1 - 4 g bolus IVPB  1 g Intravenous Once  . mupirocin ointment  1 application Nasal BID  . potassium chloride  10 mEq Intravenous Q1 Hr x 2  . potassium chloride  40 mEq Oral BID  . pyridostigmine  60 mg Oral QID  . sodium chloride  250 mL Intravenous Once  . sodium chloride  500 mL Intravenous Once  . sodium chloride  3 mL Intravenous Q12H  . vancomycin  1,000 mg Intravenous Q48H   Continuous  Infusions: . phenylephrine (NEO-SYNEPHRINE) Adult infusion 80 mcg/min (09/05/13 1007)  . phenylephrine (NEO-SYNEPHRINE) Adult infusion 90 mcg/min (09/05/13 1142)  .  sodium bicarbonate  infusion 1000 mL 125 mL/hr at 09/05/13 1027      Time spent: 35 minutes    [name], [place]  [facility] Pager [phone]. If 7PM-7AM, please contact night-coverage at [email or web address], password [email or web address] 09/05/2013, 2:36 PM  LOS: 1 day

## 2013-09-05 NOTE — Procedures (Signed)
Central Venous Catheter Insertion Procedure Note [name] [id number] [birth year]  Procedure: Insertion of Central Venous Catheter Indications: Assessment of intravascular volume, Drug and/or fluid administration and Frequent blood sampling  Procedure Details Consent: Risks of procedure as well as the alternatives and risks of each were explained to the (patient/caregiver).  Consent for procedure obtained.  Time Out: Verified patient identification, verified procedure, site/side was marked, verified correct patient position, special equipment/implants available, medications/allergies/relevent history reviewed, required imaging and test results available.  Performed  Maximum sterile technique was used including antiseptics, cap, gloves, gown, hand hygiene, mask and sheet. Skin prep: Chlorhexidine; local anesthetic administered A antimicrobial bonded/coated triple lumen catheter was placed in the right internal jugular vein to 16 cm (pt 5'7) using the Seldinger technique.  Evaluation Blood flow good Complications: No apparent complications Patient did tolerate procedure well. Chest X-ray ordered to verify placement.  CXR: pending.  Procedure performed under direct supervision of Dr. Elsworth Soho and with ultrasound guidance for real time vessel cannulation.     Noe Gens, NP-C [place] [facility] Pgr: [phone] or [place]   09/05/2013, 10:57 AM

## 2013-09-05 NOTE — Consult Note (Signed)
PULMONARY / CRITICAL CARE MEDICINE   Name: [name] MRN: [id number] DOB: 1960-03-15    ADMISSION DATE:  09/04/2013 CONSULTATION DATE:  09/05/13  REFERRING MD :  Dr. Clementeen Graham  CHIEF COMPLAINT:  Hypotension  INITIAL PRESENTATION: 53 y/o F with a PMH of HTN, CHF, Asthma, Myasthenia Gravis admitted on 8/11 with complaints of SOB/Wheezing, increased LE pain taking up to 40 tabs of ibuprofen QD.  ER work up found the patient to have acute kidney injury, metabolic acidosis, hyponatremia, LE ulcerations, and concern for MG exacerbation. Patient developed hypotension overnight 8/12, requiring pressors.  PCCM consulted for hypotension evaluation.    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  Patient reports she has had significant lower extremity pain related to chronic wounds and has been self medicating with ibuprofen since August of 2014.  She takes up to 40 tablets of 200 mg ibuprofen per day.  She recently was also started on lisinopril / HCTZ for HTN per her primary MD. Patient noted on Friday 8/7 that she began feeling weak and had acute loss of vision.  The vision remained altered throughout the weekend.  She attempted to wait it out and make it to her primary MD for an appointment but developed SOB and sought care in the ER.  Work up noted the patient to have  a Na of 126, K 7, BUN 92, Cr 7.71, Hgb 11.6, Platelet 232 and renal ultrasound with no obstructive uropathy, and concerns for medical renal disease vs AKI.  Patient was admitted to ICU and placed on a bicarbonate gtt and aggressively volume resuscitated (4L) with improvement in sr cr.  Unfortunately, she developed hypotension overnight and required treatment with neosynephrine.  PCCM was consulted for evaluation of hypotension.    At baseline, patient lives at home independently.  She cares for her wounds, is able to walk independently / occasionally uses a walker at home, uses wheelchair to get around when out of home.  She has a neighbor who helps her run errands when needed.   PAST MEDICAL HISTORY :  Past Medical History  Diagnosis Date  . Asthma   . Hypertension   . CHF (congestive heart failure)   . Myasthenia gravis   . Heart murmur   . Venous stasis    Past Surgical History  Procedure Laterality Date  . Tonsillectomy    . Myomectomy    . Failed bilateral leg grafts     Prior to Admission medications   Medication Sig Start Date End Date Taking? Authorizing Provider  albuterol (PROVENTIL HFA;VENTOLIN HFA) 108 (90 BASE) MCG/ACT inhaler Inhale 4 puffs into the lungs every 6 (six) hours as needed. For shortness of breath   Yes Historical Provider, MD  COD LIVER OIL PO Take 2 capsules by mouth daily.   Yes Historical Provider, MD  diphenhydrAMINE (BENADRYL) 25 MG tablet Take 50 mg by mouth 4 (four) times daily.   Yes Historical Provider, MD  Emollient (NIVEA) cream Apply 1 application topically daily. Apply all over body   Yes Historical Provider, MD  EPINEPHrine (EPIPEN) 0.3 mg/0.3 mL DEVI Inject 0.3 mg into the muscle as needed. For allergic reaction   Yes Historical Provider, MD  ferrous sulfate 325 (65 FE) MG tablet Take 325-650 mg by mouth daily with breakfast.   Yes Historical Provider, MD  folic acid (FOLVITE) 086 MCG tablet Take 400 mcg by mouth daily.   Yes Historical Provider,  MD  Garlic Oil 7619 MG CAPS Take 1,000 mg by mouth daily.   Yes Historical Provider, MD  ibuprofen (ADVIL,MOTRIN) 200 MG tablet Take 2,000 mg by mouth 4 (four) times daily. Takes 10 tablets to get 2000mg    Yes Historical Provider, MD  lidocaine (XYLOCAINE) 4 % external solution Apply 5 mLs topically 4 (four) times daily.  05/31/13  Yes Historical Provider, MD  lisinopril (PRINIVIL,ZESTRIL) 10 MG tablet Take 1 tablet (10 mg total) by mouth daily. 08/30/13  Yes Leana Gamer, MD  Multiple Vitamin (MULTIVITAMIN WITH MINERALS) TABS Take 1 tablet by mouth daily. 1 gummy   Yes Historical Provider, MD  Multiple Vitamins-Minerals (ZINC PO) Take 1 tablet by mouth daily.   Yes Historical Provider, MD  Omega-3 Fatty Acids (FISH OIL) 500 MG CAPS Take 1,000 mg by mouth daily.   Yes Historical Provider, MD  oxyCODONE (OXY IR/ROXICODONE) 5 MG immediate release tablet Take 1-2 tablets (5-10 mg total) by mouth every 4 (four) hours as needed. 08/30/13  Yes Leana Gamer, MD  POTASSIUM GLUCONATE PO Take 1 tablet by mouth daily.   Yes Historical Provider, MD  pyridostigmine (MESTINON) 60 MG tablet Take 60 mg by mouth 4 (four) times daily.   Yes Historical Provider, MD   Allergies  Allergen Reactions  . Asa [Aspirin] Shortness Of Breath and Rash  . Dilaudid [Hydromorphone Hcl] Shortness Of Breath and Palpitations  . Erythromycin Other (See Comments)    Burn veins  . Indomethacin Other (See Comments)    Stroke symptoms  . Naproxen Other (See Comments)    Renal failure  . Alginate-Collagen [Wound Dressings] Rash  . Gold-Containing Drug Products Rash  . Silvadene [Silver Sulfadiazine] Rash  . Tylenol [Acetaminophen] Rash and Other (See Comments)    Salivation and hearing loss    FAMILY HISTORY:  Family History  Problem Relation Age of Onset  . Depression    . Cancer     SOCIAL HISTORY:  reports that she has never smoked. She has never used smokeless tobacco. She reports that she does not drink  alcohol or use illicit drugs.  REVIEW OF SYSTEMS:   Gen: Denies fever, chills, weight change, fatigue, night sweats HEENT: Denies blurred vision, double vision, hearing loss, tinnitus, sinus congestion, rhinorrhea, sore throat, neck stiffness, dysphagia.  Reports resolution of vision changes.   PULM: Denies shortness of breath, cough, sputum production, hemoptysis, wheezing.  See HPI.  Acute SOB resolved.  CV: Denies chest pain, edema, orthopnea, paroxysmal nocturnal dyspnea, palpitations GI: Denies abdominal pain, nausea, vomiting, diarrhea, hematochezia, melena, constipation, change in bowel habits GU: Denies dysuria, hematuria, polyuria, oliguria, urethral discharge Endocrine: Denies hot or cold intolerance, polyuria, polyphagia or appetite change Derm: Denies rash, dry skin, scaling or peeling skin change.  Chronic LE wounds unchanged, no streaking or cellulitis concerns.  Heme: Denies easy bruising, bleeding, bleeding gums Neuro: Denies headache, numbness, weakness,  slurred speech, loss of memory or consciousness.  Reports improvement in weakness.     SUBJECTIVE:   VITAL SIGNS: Temp:  [97.1 F (36.2 C)-99.1 F (37.3 C)] 99.1 F (37.3 C) (08/12 0800) Pulse Rate:  [91-122] 109 (08/12 0745) Resp:  [11-21] 17 (08/12 0745) BP: (62-123)/(14-65) 92/58 mmHg (08/12 0715) SpO2:  [92 %-100 %] 100 % (08/12 0745) FiO2 (%):  [100 %] 100 % (08/11 2000) Weight:  [192 lb 3.9 oz (87.2 kg)] 192 lb 3.9 oz (87.2 kg) (08/11 1400)  HEMODYNAMICS:    VENTILATOR SETTINGS: Vent Mode:  [-]  FiO2 (%):  [100 %] 100 %  INTAKE / OUTPUT:  Intake/Output Summary (Last 24 hours) at 09/05/13 0902 Last data filed at 09/05/13 0700  Gross per 24 hour  Intake   6209 ml  Output   1477 ml  Net   4732 ml    PHYSICAL EXAMINATION: General:  Chronically ill adult female in NAD Neuro:  AAOx4, speech clear, power 5/5 , no nystagmus HEENT:  Mm pink/moist, no jvd Cardiovascular:  s1s2 rrr, no m/r/g Lungs:   resp's even/non-labored, few scattered wheezes bilaterally  Abdomen:  Round/soft, bsx4 active Musculoskeletal:  No acute deformities Skin:  Chronic LE wounds dressed, weeping through dressing noted.  No streaking or cellulitis noted. Good cap refill  LABS:  CBC  Recent Labs Lab 08/30/13 1605 09/04/13 0819 09/05/13 0545  WBC 6.9 10.9* 10.7*  HGB 11.5* 11.6* 9.2*  HCT 35.0* 35.5* 27.2*  PLT 367 332 212   Coag's  Recent Labs Lab 09/05/13 0545  INR 1.23   BMET  Recent Labs Lab 09/04/13 1525 09/04/13 2337 09/05/13 0545  NA 133* 138 137  K 6.3* 4.0 3.0*  CL 96 103 101  CO2 8* 13* 17*  BUN 86* 81* 69*  CREATININE 6.77* 5.01* 3.71*  GLUCOSE 93 120* 104*   Electrolytes  Recent Labs Lab 08/30/13 1605  09/04/13 1525 09/04/13 2337 09/05/13 0545  CALCIUM 8.9  < > 7.8* 6.6* 6.4*  MG 2.4  --   --   --  1.9  < > = values in this interval not displayed. Sepsis Markers  Recent Labs Lab 09/04/13 0837  LATICACIDVEN 1.28   ABG No results found for this basename: PHART, PCO2ART, PO2ART,  in the last 168 hours Liver Enzymes  Recent Labs Lab 08/30/13 1605 09/04/13 0819  AST 13 10  ALT 10 9  ALKPHOS 70 84  BILITOT 0.2 <0.2*  ALBUMIN 3.5 3.0*   Cardiac Enzymes  Recent Labs Lab 09/04/13 0819  TROPONINI <0.30   Glucose No results found for this basename: GLUCAP,  in the last 168 hours  Imaging Dg Chest 2 View  09/04/2013   CLINICAL DATA:  Shortness of breath.  Wheezing.  EXAM: CHEST  2 VIEW  COMPARISON:  10/21/2010  FINDINGS: Heart size and pulmonary vascularity are normal. No infiltrates or effusions. Slight peribronchial thickening consistent with bronchitis. No acute osseous abnormality.  IMPRESSION: Bronchitic changes.   Electronically Signed   By: Rozetta Nunnery M.D.   On: 09/04/2013 08:51   US Renal  09/04/2013   CLINICAL DATA:  Elevated renal function tests  EXAM: RENAL/URINARY TRACT ULTRASOUND COMPLETE  COMPARISON:  None.  FINDINGS: Right Kidney:   Length: 11.8 cm. Increased renal cortical echogenicity. No mass or hydronephrosis visualized.  Left Kidney:  Length: 10.5 cm. Increased renal cortical echogenicity. No mass or hydronephrosis visualized.  Bladder:  Appears normal for degree of bladder distention.  IMPRESSION: 1. No urolithiasis or  obstructive uropathy. 2. Increased renal cortical echogenicity as can be seen with medical renal disease versus AKI.   Electronically Signed   By: Kathreen Devoid   On: 09/04/2013 10:47     ASSESSMENT / PLAN:  PULMONARY OETT n/a A: No acute issues P:   Wean oxygen to off  Pulmonary Hygiene:  IS, Mobilize   CARDIOVASCULAR CVL 8/12 >>  A:  Septic Shock - in setting of dehydration, AKI.  Lactate negative. Hx HTN Hx CHF P:  Bicarbonate gtt at 125 ml/hr Repeat Lactic acid Neosynephrine gtt for MAP > 65 May require aline, concern NIBP is inaccurate with good mental status + good UOP CVP Q4 - use CVp to guide volume  RENAL A:   AKI - secondary to NSAID + lisinopril usage, improving. Baseline sr cr ~1.3 Hyperkalemia - Resolved.  AG Metabolic Acidosis - secondary to AKI, +/- RTA.  8/12 Gap 19 Hypokalemia Hypocalcemia - corrected calcium for albumin 7.2 P:   Trend BMP BID  Continue Bicarb gtt  No need for acute HD at this time Nephrology Following Replace K   GASTROINTESTINAL A:   No acute issues  P:   GI PPx not indicated Renal diet as tolerated   HEMATOLOGIC A:   Anemia - of chronic disease Hx of DVT  P:  Monitor CBC Heparin SQ  INFECTIOUS A:   Chronic LE Venous Stasis Ulcerations P:   No evidence of acute infection but cannot r/o sepsis from wounds - vanc x 1, obtain pct WOC consult  ENDOCRINE A:   No acute issues   P:   Follow glucose on BMP Assess TSH  NEUROLOGIC A:   Myasthenia Gravis - without acute flare Pain  P:   RASS goal: n/a Continue Mestinon Neurology evaluated and no further recommendations at this time.  Vicodin, Oxy for pain   TODAY'S  SUMMARY: 53 y/o F with MG & chronic LE venous stasis wounds who was self medicating with ibuprofen for LE pain.  Admitted with AKI in the setting of NSAID + ACE-I usage.  Resolving AKI but ongoing hypotension on neosynephrine.  TLC placed for CVP assessment + pressors.   Noe Gens, NP-C [place] [facility] Pgr: [phone] or [phone]   I have personally obtained a history, examined the patient, evaluated laboratory and imaging results, formulated the assessment and plan and placed orders. CRITICAL CARE: The patient is critically ill with multiple organ systems failure and requires high complexity decision making for assessment and support, frequent evaluation and titration of therapies, application of advanced monitoring technologies and extensive interpretation of multiple databases. Critical Care Time devoted to patient care services described in this note is 45  minutes.    Rigoberto Noel MD   09/05/2013, 9:02 AM

## 2013-09-05 NOTE — Progress Notes (Signed)
PT Cancellation Note  Patient Details Name: [name] MRN: [id number] DOB: Jun 02, 1960   Cancelled Treatment:    Reason Eval/Treat Not Completed: Medical issues which prohibited therapy--low BP. Will hold PT today and check back tomorrow to attempt eval if medically ready. Thanks.    Weston Anna [facility] 09/05/2013, 2:09 PM

## 2013-09-06 DIAGNOSIS — N179 Acute kidney failure, unspecified: Secondary | ICD-10-CM | POA: Diagnosis not present

## 2013-09-06 DIAGNOSIS — A419 Sepsis, unspecified organism: Secondary | ICD-10-CM | POA: Diagnosis not present

## 2013-09-06 LAB — CBC
HCT: 24.7 % — ABNORMAL LOW (ref 36.0–46.0)
Hemoglobin: 8.3 g/dL — ABNORMAL LOW (ref 12.0–15.0)
MCH: 22.6 pg — AB (ref 26.0–34.0)
MCHC: 33.6 g/dL (ref 30.0–36.0)
MCV: 67.1 fL — AB (ref 78.0–100.0)
PLATELETS: 200 10*3/uL (ref 150–400)
RBC: 3.68 MIL/uL — ABNORMAL LOW (ref 3.87–5.11)
RDW: 18.2 % — ABNORMAL HIGH (ref 11.5–15.5)
WBC: 10.7 10*3/uL — ABNORMAL HIGH (ref 4.0–10.5)

## 2013-09-06 LAB — BASIC METABOLIC PANEL
Anion gap: 13 (ref 5–15)
Anion gap: 12 (ref 5–15)
BUN: 48 mg/dL — ABNORMAL HIGH (ref 6–23)
BUN: 41 mg/dL — AB (ref 6–23)
CALCIUM: 6.8 mg/dL — AB (ref 8.4–10.5)
CO2: 25 mEq/L (ref 19–32)
CO2: 29 mEq/L (ref 19–32)
CREATININE: 1.89 mg/dL — AB (ref 0.50–1.10)
CREATININE: 1.57 mg/dL — AB (ref 0.50–1.10)
Calcium: 6.4 mg/dL — CL (ref 8.4–10.5)
Chloride: 99 mEq/L (ref 96–112)
Chloride: 98 mEq/L (ref 96–112)
GFR calc Af Amer: 34 mL/min — ABNORMAL LOW (ref >90)
GFR calc Af Amer: 42 mL/min — ABNORMAL LOW (ref >90)
GFR, EST NON AFRICAN AMERICAN: 29 mL/min — AB (ref >90)
GFR, EST NON AFRICAN AMERICAN: 36 mL/min — AB (ref >90)
GLUCOSE: 124 mg/dL — AB (ref 70–99)
Glucose, Bld: 125 mg/dL — ABNORMAL HIGH (ref 70–99)
POTASSIUM: 2.7 meq/L — AB (ref 3.7–5.3)
Potassium: 2.4 mEq/L — CL (ref 3.7–5.3)
SODIUM: 137 meq/L (ref 137–147)
Sodium: 139 mEq/L (ref 137–147)

## 2013-09-06 LAB — PROCALCITONIN: Procalcitonin: 1.28 ng/mL

## 2013-09-06 LAB — CORTISOL: CORTISOL PLASMA: 19.8 ug/dL

## 2013-09-06 LAB — MAGNESIUM: Magnesium: 1.7 mg/dL (ref 1.5–2.5)

## 2013-09-06 MED ORDER — POTASSIUM CHLORIDE 10 MEQ/50ML IV SOLN
10.0000 meq | INTRAVENOUS | Status: AC
  Administered 2013-09-06 (×4): 10 meq via INTRAVENOUS
  Filled 2013-09-06 (×5): qty 50

## 2013-09-06 MED ORDER — POTASSIUM CHLORIDE CRYS ER 20 MEQ PO TBCR
40.0000 meq | EXTENDED_RELEASE_TABLET | Freq: Once | ORAL | Status: AC
  Administered 2013-09-06: 40 meq via ORAL
  Filled 2013-09-06: qty 2

## 2013-09-06 MED ORDER — VANCOMYCIN HCL 10 G IV SOLR
1250.0000 mg | Freq: Every day | INTRAVENOUS | Status: DC
  Administered 2013-09-06: 1250 mg via INTRAVENOUS
  Filled 2013-09-06 (×2): qty 1250

## 2013-09-06 MED ORDER — SODIUM CHLORIDE 0.9 % IV SOLN
1.0000 g | Freq: Once | INTRAVENOUS | Status: AC
  Administered 2013-09-06: 1 g via INTRAVENOUS
  Filled 2013-09-06: qty 10

## 2013-09-06 MED ORDER — DIPHENHYDRAMINE HCL 50 MG/ML IJ SOLN: 50.0000 mg | Freq: Four times a day (QID) | INTRAMUSCULAR | Status: DC | PRN

## 2013-09-06 MED ORDER — SODIUM CHLORIDE 0.9 % IV SOLN
INTRAVENOUS | Status: DC
  Administered 2013-09-06 – 2013-09-08 (×4): via INTRAVENOUS

## 2013-09-06 MED ORDER — POTASSIUM CHLORIDE CRYS ER 20 MEQ PO TBCR
40.0000 meq | EXTENDED_RELEASE_TABLET | Freq: Once | ORAL | Status: AC
Start: 2013-09-06 — End: 2013-09-06
  Administered 2013-09-06: 40 meq via ORAL
  Filled 2013-09-06: qty 2

## 2013-09-06 MED ORDER — DIPHENHYDRAMINE HCL 50 MG PO CAPS
50.0000 mg | ORAL_CAPSULE | Freq: Four times a day (QID) | ORAL | Status: DC | PRN
  Administered 2013-09-06 – 2013-09-09 (×7): 50 mg via ORAL
  Filled 2013-09-06 (×7): qty 1

## 2013-09-06 NOTE — Clinical Documentation Improvement (Signed)
Possible Clinical Conditions?  Chronic Systolic Congestive Heart Failure Chronic Diastolic Congestive Heart Failure Chronic Systolic & Diastolic Congestive Heart Failure Acute Systolic Congestive Heart Failure Acute Diastolic Congestive Heart Failure Acute Systolic & Diastolic Congestive Heart Failure Acute on Chronic Systolic Congestive Heart Failure Acute on Chronic Diastolic Congestive Heart Failure Acute on Chronic Systolic & Diastolic Congestive Heart Failure Other Condition________________________________________ Cannot Clinically Determine  Supporting Information:  Per  09/04/13 H&P = She has a reported h/o CHF but doesn't appear to be on lasix.  Per 09/04/13 Problem list = Acute CHF    Thank You, Serena Colonel ,RN Clinical Documentation Specialist:  [place] [employer]

## 2013-09-06 NOTE — Progress Notes (Signed)
ANTIBIOTIC CONSULT NOTE - Follow up  Pharmacy Consult for Vancomycin Indication: rule out sepsis  Allergies  Allergen Reactions  . Asa [Aspirin] Shortness Of Breath and Rash  . Dilaudid [Hydromorphone Hcl] Shortness Of Breath and Palpitations  . Erythromycin Other (See Comments)    Burn veins  . Indomethacin Other (See Comments)    Stroke symptoms  . Naproxen Other (See Comments)    Renal failure  . Alginate-Collagen [Wound Dressings] Rash  . Gold-Containing Drug Products Rash  . Silvadene [Silver Sulfadiazine] Rash  . Tylenol [Acetaminophen] Rash and Other (See Comments)    Salivation and hearing loss    Patient Measurements: Height: 5\' 7"  (170.2 cm) Weight: 192 lb 3.9 oz (87.2 kg) IBW/kg (Calculated) : 61.6  Vital Signs: Temp: 97.9 F (36.6 C) (08/13 0400) Temp src: Oral (08/13 0400) BP: 102/61 mmHg (08/13 0600) Pulse Rate: 72 (08/13 0600) Intake/Output from previous day: 08/12 0701 - 08/13 0700 In: 7104.9 [P.O.:620; I.V.:3774.9; IV Piggyback:2710] Out: 2090 [Urine:2090] Intake/Output from this shift:    Labs:  Recent Labs  09/04/13 0819 09/04/13 1131  09/05/13 0545 09/05/13 1107 09/05/13 2307 09/06/13 0500  WBC 10.9*  --   --  10.7*  --   --  10.7*  HGB 11.6*  --   --  9.2*  --   --  8.3*  PLT 332  --   --  212  --   --  200  LABCREA  --  64.84  --   --   --   --   --   CREATININE 7.71*  --   < > 3.71* 2.76* 1.89*  --   < > = values in this interval not displayed. Estimated Creatinine Clearance: 38.6 ml/min (by C-G formula based on Cr of 1.89). No results found for this basename: VANCOTROUGH, [name], VANCORANDOM, [place], GENTPEAK, GENTRANDOM, TOBRATROUGH, TOBRAPEAK, TOBRARND, AMIKACINPEAK, AMIKACINTROU, AMIKACIN,  in the last 72 hours   Microbiology: Recent Results (from the past 720 hour(s))  MRSA PCR SCREENING     Status: Abnormal   Collection Time    09/04/13  2:30 PM      Result Value Ref Range Status   MRSA by PCR POSITIVE (*) NEGATIVE  Final   Comment:            The GeneXpert MRSA Assay (FDA     approved for NASAL specimens     only), is one component of a     comprehensive MRSA colonization     surveillance program. It is not     intended to diagnose MRSA     infection nor to guide or     monitor treatment for     MRSA infections.     RESULT CALLED TO, READ BACK BY AND VERIFIED WITH:     BULLINS,H AT [id number] ON 09/04/13 BY INGLEE   CULTURE, BLOOD (ROUTINE X 2)     Status: None   Collection Time    09/05/13 11:53 AM      Result Value Ref Range Status   Specimen Description BLOOD LEFT HAND   Final   Special Requests BOTTLES DRAWN AEROBIC ONLY 1CC   Final   Culture  Setup Time     Final   Value: 09/05/2013 14:07     Performed at [facility]   Culture     Final   Value:        BLOOD CULTURE RECEIVED NO GROWTH TO DATE CULTURE WILL BE HELD FOR 5 DAYS BEFORE  ISSUING A FINAL NEGATIVE REPORT     Performed at [facility]   Report Status PENDING   Incomplete  CULTURE, BLOOD (ROUTINE X 2)     Status: None   Collection Time    09/05/13 11:55 AM      Result Value Ref Range Status   Specimen Description BLOOD RIGHT FRESH JUGULAR   Final   Special Requests BOTTLES DRAWN AEROBIC AND ANAEROBIC 10CC   Final   Culture  Setup Time     Final   Value: 09/05/2013 14:07     Performed at [facility]   Culture     Final   Value:        BLOOD CULTURE RECEIVED NO GROWTH TO DATE CULTURE WILL BE HELD FOR 5 DAYS BEFORE ISSUING A FINAL NEGATIVE REPORT     Performed at [facility]   Report Status PENDING   Incomplete     Anti-infectives   Start     Dose/Rate Route Frequency Ordered Stop   09/05/13 1200  vancomycin (VANCOCIN) IVPB 1000 mg/200 mL premix     1,000 mg 200 mL/hr over 60 Minutes Intravenous Every 48 hours 09/05/13 1156       Assessment: 70 yoF admitted 8/11 with progressive weakness, SOB, leg pain.  PMH includes myasthenia gravis, HTN, CHF, RA, venous stasis wounds, DVT.  She presents  with AoCKD associated with excessive NSAID use (up to 40 tabs of ibuprofen daily) as well as ACEI (lisinopril).  Initial SCr up to 7.7, but improved to 3.71 today.  She is now hypotensive, requiring pressors.  She has chronic LE wounds w/o evidence of acute infection but cannot r/o sepsis from wounds.  Pharmacy is consulted to dose Vancomycin.  8/13: Day #2 Vancomycin  Tmax: 99.2  WBCs: 10.7  Renal: SCr significantly improved to 1.89 with CrCl ~ 39 ml/min  Blood cultures: ngtd   Goal of Therapy:  Vancomycin trough level 15-20 mcg/ml  Plan:   Vancomycin 1250 mg IV q24h.  Measure Vanc trough at steady state.  Follow up renal fxn and culture results.    Gretta Arab PharmD, BCPS Pager [phone] 09/06/2013 7:55 AM

## 2013-09-06 NOTE — Progress Notes (Signed)
PULMONARY / CRITICAL CARE MEDICINE   Name: [name] MRN: [id number] DOB: 1960-08-26    ADMISSION DATE:  09/04/2013 CONSULTATION DATE:  09/05/13  REFERRING MD :  Dr. Clementeen Graham  CHIEF COMPLAINT:  Hypotension  INITIAL PRESENTATION: 53 y/o F  admitted on 8/11 with complaints of SOB/Wheezing, increased LE pain taking up to 40 tabs of ibuprofen QD.  ER work up found the patient to have acute kidney injury, metabolic acidosis, hyponatremia, LE ulcerations, and concern for MG exacerbation. Patient developed hypotension overnight 8/12, requiring pressors.  PCCM consulted for hypotension evaluation.    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 /  Chronic LE wounds, Rheumatoid Arthritis, Hx of Prior DVT in 2000 & 2004 (not on anticoagulation), Myasthenia Gravis (onset at age 53, on mestinon since, hx of 2 flares after acute illness)  STUDIES:  8/11  Renal US >> no urolithiasis or obstructive uropathy, concern for medical renal disease vs AKI  SIGNIFICANT EVENTS: 8/11  Admit with AKI in setting of NSAID usage + lisinopril, LE wounds, +/- MG flare 8/12  PCCM consulted for hypotension   SUBJECTIVE: c/o itching Denies pain, dyspnea Afebrile Good UO CVP 9  VITAL SIGNS: Temp:  [97.9 F (36.6 C)-99.2 F (37.3 C)] 97.9 F (36.6 C) (08/13 0800) Pulse Rate:  [37-109] 74 (08/13 0800) Resp:  [10-28] 11 (08/13 0800) BP: (77-145)/(25-102) 93/56 mmHg (08/13 0800) SpO2:  [93 %-100 %] 100 % (08/13 0800)  HEMODYNAMICS: CVP:  [6 mmHg-12 mmHg] 9 mmHg  VENTILATOR SETTINGS:    INTAKE / OUTPUT:  Intake/Output Summary (Last 24 hours) at 09/06/13 1021 Last data filed at 09/06/13 1000  Gross per 24 hour  Intake 7016.34 ml  Output   1665 ml  Net 5351.34 ml    PHYSICAL EXAMINATION: General:  Chronically ill adult female in NAD Neuro:  AAOx4, speech clear, power 5/5 , no nystagmus HEENT:  Mm pink/moist, no jvd Cardiovascular:  s1s2 rrr, no m/r/g Lungs:  resp's  even/non-labored, few scattered rhonchi bilaterally  Abdomen:  Round/soft, bsx4 active Musculoskeletal:  No acute deformities Skin:  Chronic LE wounds dressed, weeping through dressing noted.  No streaking or cellulitis noted. Good cap refill  LABS:  CBC  Recent Labs Lab 09/04/13 0819 09/05/13 0545 09/06/13 0500  WBC 10.9* 10.7* 10.7*  HGB 11.6* 9.2* 8.3*  HCT 35.5* 27.2* 24.7*  PLT 332 212 200   Coag's  Recent Labs Lab 09/05/13 0545  INR 1.23   BMET  Recent Labs Lab 09/05/13 0545 09/05/13 1107 09/05/13 2307  NA 137 136* 137  K 3.0* 2.4* 2.4*  CL 101 98 99  CO2 17* 21 25  BUN 69* 60* 48*  CREATININE 3.71* 2.76* 1.89*  GLUCOSE 104* 121* 125*   Electrolytes  Recent Labs Lab 08/30/13 1605  09/05/13 0545 09/05/13 1107 09/05/13 2307 09/06/13 0915  CALCIUM 8.9  < > 6.4* 6.4* 6.4*  --   MG 2.4  --  1.9  --   --  1.7  < > = values in this interval not displayed. Sepsis Markers  Recent Labs Lab 09/04/13 0837 09/05/13 1100 09/05/13 1107 09/06/13 0500  LATICACIDVEN 1.28 1.9  --   --   PROCALCITON  --   --  1.19 1.28   ABG No results found for this basename: PHART, PCO2ART, PO2ART,  in the last 168 hours Liver Enzymes  Recent Labs Lab 08/30/13 1605 09/04/13 0819  AST 13 10  ALT 10 9  ALKPHOS 70 84  BILITOT 0.2 <0.2*  ALBUMIN 3.5 3.0*   Cardiac Enzymes  Recent Labs Lab 09/04/13 0819  TROPONINI <0.30   Glucose No results found for this basename: GLUCAP,  in the last 168 hours  Imaging Dg Chest Port 1 View  09/05/2013   CLINICAL DATA:  Central venous catheter placement.  EXAM: PORTABLE CHEST - 1 VIEW  COMPARISON:  Radiographs 11/2013 and 10/21/2010.  FINDINGS: 1121 hr. Right IJ central venous catheter extends to the level of the SVC right atrial junction. There are lower lung volumes with mildly increased bibasilar atelectasis. No pneumothorax, confluent airspace opacity or significant pleural effusion is identified. The heart size and  mediastinal contours are stable.  IMPRESSION: Central venous catheter placement as described without complicating pneumothorax. Mild increase in bibasilar atelectasis.   Electronically Signed   By: Camie Patience M.D.   On: 09/05/2013 12:01     ASSESSMENT / PLAN:  PULMONARY OETT n/a A: No acute issues P:   Wean oxygen to off  Pulmonary Hygiene:  IS, Mobilize   CARDIOVASCULAR CVL 8/12 >>  A:  Septic Shock - in setting of dehydration, AKI.  Lactate negative. Hx HTN Hx CHF P:  Neosynephrine gtt for MAP > 65 -taper to off  CVP Q4 - use CVp to guide volume  RENAL A:   AKI - secondary to NSAID + lisinopril usage, improving. Baseline sr cr ~1.3 Hyperkalemia - Resolved.  AG Metabolic Acidosis - secondary to AKI, +/- RTA.  8/12 Gap 19 Hypokalemia Hypocalcemia - corrected calcium for albumin 7.2 P:   Trend BMP BID  dc Bicarb gtt  Nephrology Following Replace K aggressively, Mg Ok  GASTROINTESTINAL A:   No acute issues  P:   GI PPx not indicated Renal diet as tolerated   HEMATOLOGIC A:   Anemia - of chronic disease Hx of DVT  P:  Monitor CBC Heparin SQ  INFECTIOUS A:   Chronic LE Venous Stasis Ulcerations No evidence of acute infection but cannot r/o sepsis from wounds P:    - vanc per pharmacy , can stop in 24h if cx neg WOC consult  ENDOCRINE A:  TSH & free T3 low - ? Sick euthyroid P:   Follow glucose on BMP   NEUROLOGIC A:   Myasthenia Gravis - without acute flare Pain  P:   Continue Mestinon Neurology evaluated and no further recommendations at this time.  Vicodin, Oxy for pain   TODAY'S SUMMARY: 53 y/o F with MG & chronic LE venous stasis wounds who was self medicating with ibuprofen for LE pain.  Admitted with AKI in the setting of NSAID + ACE-I usage.  Resolving AKI but ongoing hypotension on neosynephrine, presumed septic shock    I have personally obtained a history, examined the patient, evaluated laboratory and imaging results, formulated  the assessment and plan and placed orders. CRITICAL CARE: The patient is critically ill with multiple organ systems failure and requires high complexity decision making for assessment and support, frequent evaluation and titration of therapies, application of advanced monitoring technologies and extensive interpretation of multiple databases. Critical Care Time devoted to patient care services described in this note is 35  minutes.    Rigoberto Noel MD   09/06/2013, 10:21 AM

## 2013-09-06 NOTE — Progress Notes (Signed)
CRITICAL VALUE ALERT  Critical value received:  K+ 2.4; Ca+ 6.4  Date of notification:  09/05/13  Time of notification:  01:20  Critical value read back:Yes.    Nurse who received alert:  A. Tamala Julian  MD notified (1st page): Elink  Time of first page:  01:21  MD notified (2nd page):  Time of second page:  Responding MD: Warren Lacy  Time MD responded:  01:21

## 2013-09-06 NOTE — Evaluation (Signed)
Physical Therapy Evaluation Patient Details Name: [name] MRN: [id number] DOB: 1961-01-09 Today's Date: 09/06/2013   History of Present Illness  53 yo female admitted with acute renal failure, hypotension, weakness. Hx of myasthenia gravis, LE ulcers, RA, HTN, CHF, asthma, chronic LE wounds. Pt lives alone  Clinical Impression  On eval, pt required Mod assist for mobility-able to perform stand pivot from bed to recliner with walker. Limited by weakness, dizziness. Pt lives alone. At this time, recommend SNF for placement-if pt will agree. Pt states plan is for home. Highly recommend OT consult as well.     Follow Up Recommendations SNF (unless mobility improves significantly AND if pt agreeable)    Equipment Recommendations  None recommended by PT    Recommendations for Other Services OT consult     Precautions / Restrictions Precautions Precautions: Fall Precaution Comments: bil LE wounds Restrictions Weight Bearing Restrictions: No      Mobility  Bed Mobility Overal bed mobility: Needs Assistance Bed Mobility: Supine to Sit     Supine to sit: HOB elevated;Min assist     General bed mobility comments: Increased time. close guard for safety. assist to manage lines  Transfers Overall transfer level: Needs assistance Equipment used: Rolling walker (2 wheeled) Transfers: Sit to/from [employer] Transfers Sit to Stand: From elevated surface;Min assist Stand pivot transfers: Mod assist       General transfer comment: assist to rise, stabilize, control descent, maneuver with RW. Vcs safety, technique, hand placement. Stand pivot bed>recliner  Ambulation/Gait             General Gait Details: Unable to attempt due to dizziness, weakness  Stairs            Wheelchair Mobility    Modified Rankin (Stroke Patients Only)       Balance                                             Pertinent Vitals/Pain Pain Assessment:  No/denies pain    Home Living Family/patient expects to be discharged to:: Unsure Living Arrangements: Alone   Type of Home: Apartment Home Access: Stairs to enter Entrance Stairs-Rails: None Entrance Stairs-Number of Steps: 2 Home Layout: One level Home Equipment: Walker - 2 wheels;Wheelchair - manual;Bedside commode      Prior Function Level of Independence: Independent with assistive device(s);Needs assistance         Comments: transfers into wheelchair or walks small distances. SCAT for transportation     Hand Dominance        Extremity/Trunk Assessment   Upper Extremity Assessment: Generalized weakness           Lower Extremity Assessment: Generalized weakness;RLE deficits/detail RLE Deficits / Details: R knee fused.     Cervical / Trunk Assessment: Kyphotic  Communication   Communication: No difficulties  Cognition Arousal/Alertness: Awake/alert Behavior During Therapy: WFL for tasks assessed/performed Overall Cognitive Status: Within Functional Limits for tasks assessed                      General Comments      Exercises        Assessment/Plan    PT Assessment Patient needs continued PT services  PT Diagnosis Difficulty walking;Abnormality of gait;Generalized weakness   PT Problem List Decreased strength;Decreased activity tolerance;Decreased balance;Decreased mobility;Obesity;Decreased knowledge of use of DME;Decreased skin integrity;Decreased range of  motion  PT Treatment Interventions DME instruction;Gait training;Functional mobility training;Therapeutic activities;Patient/family education;Balance training;Therapeutic exercise   PT Goals (Current goals can be found in the Care Plan section) Acute Rehab PT Goals Patient Stated Goal: home PT Goal Formulation: With patient Time For Goal Achievement: 09/20/13 Potential to Achieve Goals: Fair    Frequency Min 3X/week   Barriers to discharge        Co-evaluation                End of Session   Activity Tolerance: Patient limited by fatigue (Limited by dizziness) Patient left: in chair;with call bell/phone within reach           Time: 1453-1519 PT Time Calculation (min): 26 min   Charges:   PT Evaluation $Initial PT Evaluation Tier I: 1 Procedure PT Treatments $Therapeutic Activity: 23-37 mins   PT G Codes:          Weston Anna, MPT Pager: [phone]

## 2013-09-07 DIAGNOSIS — A419 Sepsis, unspecified organism: Secondary | ICD-10-CM | POA: Diagnosis not present

## 2013-09-07 DIAGNOSIS — G7000 Myasthenia gravis without (acute) exacerbation: Secondary | ICD-10-CM | POA: Diagnosis not present

## 2013-09-07 DIAGNOSIS — I959 Hypotension, unspecified: Secondary | ICD-10-CM | POA: Diagnosis not present

## 2013-09-07 DIAGNOSIS — N179 Acute kidney failure, unspecified: Secondary | ICD-10-CM | POA: Diagnosis not present

## 2013-09-07 LAB — CBC
HCT: 26.6 % — ABNORMAL LOW (ref 36.0–46.0)
Hemoglobin: 8.5 g/dL — ABNORMAL LOW (ref 12.0–15.0)
MCH: 22.4 pg — ABNORMAL LOW (ref 26.0–34.0)
MCHC: 32 g/dL (ref 30.0–36.0)
MCV: 70.2 fL — ABNORMAL LOW (ref 78.0–100.0)
Platelets: 211 K/uL (ref 150–400)
RBC: 3.79 MIL/uL — ABNORMAL LOW (ref 3.87–5.11)
RDW: 18.3 % — ABNORMAL HIGH (ref 11.5–15.5)
WBC: 6 K/uL (ref 4.0–10.5)

## 2013-09-07 LAB — BASIC METABOLIC PANEL WITH GFR
Anion gap: 10 (ref 5–15)
BUN: 35 mg/dL — ABNORMAL HIGH (ref 6–23)
CO2: 29 meq/L (ref 19–32)
Calcium: 7.5 mg/dL — ABNORMAL LOW (ref 8.4–10.5)
Chloride: 103 meq/L (ref 96–112)
Creatinine, Ser: 1.35 mg/dL — ABNORMAL HIGH (ref 0.50–1.10)
GFR calc Af Amer: 51 mL/min — ABNORMAL LOW
GFR calc non Af Amer: 44 mL/min — ABNORMAL LOW
Glucose, Bld: 91 mg/dL (ref 70–99)
Potassium: 3.7 meq/L (ref 3.7–5.3)
Sodium: 142 meq/L (ref 137–147)

## 2013-09-07 LAB — BASIC METABOLIC PANEL
Anion gap: 10 (ref 5–15)
BUN: 37 mg/dL — AB (ref 6–23)
CO2: 28 mEq/L (ref 19–32)
CREATININE: 1.4 mg/dL — AB (ref 0.50–1.10)
Calcium: 7.2 mg/dL — ABNORMAL LOW (ref 8.4–10.5)
Chloride: 100 mEq/L (ref 96–112)
GFR calc Af Amer: 48 mL/min — ABNORMAL LOW (ref >90)
GFR, EST NON AFRICAN AMERICAN: 42 mL/min — AB (ref >90)
GLUCOSE: 116 mg/dL — AB (ref 70–99)
POTASSIUM: 3.1 meq/L — AB (ref 3.7–5.3)
Sodium: 138 mEq/L (ref 137–147)

## 2013-09-07 LAB — PROCALCITONIN: Procalcitonin: 0.57 ng/mL

## 2013-09-07 NOTE — Progress Notes (Addendum)
PULMONARY / CRITICAL CARE MEDICINE   Name: [name] MRN: [id number] DOB: 08-19-1960    ADMISSION DATE:  09/04/2013 CONSULTATION DATE:  09/05/13  REFERRING MD :  Dr. Clementeen Graham  CHIEF COMPLAINT:  Hypotension  INITIAL PRESENTATION: 53 y/o F  admitted on 8/11 with complaints of SOB/Wheezing, increased LE pain taking up to 40 tabs of ibuprofen QD.  ER work up found the patient to have acute kidney injury, metabolic acidosis, hyponatremia, LE ulcerations, and concern for MG exacerbation. Patient developed hypotension overnight 8/12, requiring pressors.  PCCM consulted for hypotension evaluation.    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 /  Chronic LE wounds, Rheumatoid Arthritis, Hx of Prior DVT in 2000 & 2004 (not on anticoagulation), Myasthenia Gravis (onset at age 61, on mestinon since, hx of 2 flares after acute illness)  STUDIES:  8/11  Renal US >> no urolithiasis or obstructive uropathy, concern for medical renal disease vs AKI  SIGNIFICANT EVENTS: 8/11  Admit with AKI in setting of NSAID usage + lisinopril, LE wounds, +/- MG flare 8/12  PCCM consulted for hypotension   SUBJECTIVE: Denies pain, dyspnea Afebrile Good UO Off pressors  VITAL SIGNS: Temp:  [97.6 F (36.4 C)-98.5 F (36.9 C)] 97.6 F (36.4 C) (08/14 0400) Pulse Rate:  [72-87] 73 (08/14 0800) Resp:  [10-26] 12 (08/14 0800) BP: (65-126)/(43-84) 102/84 mmHg (08/14 0800) SpO2:  [84 %-100 %] 100 % (08/14 0800) Weight:  [218 lb 4.1 oz (99 kg)] 218 lb 4.1 oz (99 kg) (08/14 0700)  HEMODYNAMICS: CVP:  [3 mmHg-12 mmHg] 3 mmHg  VENTILATOR SETTINGS:    INTAKE / OUTPUT:  Intake/Output Summary (Last 24 hours) at 09/07/13 0848 Last data filed at 09/07/13 0800  Gross per 24 hour  Intake 2288.26 ml  Output   1266 ml  Net 1022.26 ml    PHYSICAL EXAMINATION: General:  Chronically ill adult female in NAD Neuro:  AAOx4, speech clear, power 5/5 , no nystagmus HEENT:  Mm pink/moist, no  jvd Cardiovascular:  s1s2 rrr, no m/r/g Lungs:  resp's even/non-labored, decreased bilaterally  Abdomen:  Round/soft, bsx4 active Musculoskeletal:  No acute deformities Skin:  Chronic LE wounds dressed, weeping through dressing noted.  No streaking or cellulitis noted. Good cap refill  LABS:  CBC  Recent Labs Lab 09/05/13 0545 09/06/13 0500 09/07/13 0420  WBC 10.7* 10.7* 6.0  HGB 9.2* 8.3* 8.5*  HCT 27.2* 24.7* 26.6*  PLT 212 200 211   Coag's  Recent Labs Lab 09/05/13 0545  INR 1.23   BMET  Recent Labs Lab 09/06/13 0915 09/07/13 0050 09/07/13 0420  NA 139 138 142  K 2.7* 3.1* 3.7  CL 98 100 103  CO2 29 28 29   BUN 41* 37* 35*  CREATININE 1.57* 1.40* 1.35*  GLUCOSE 124* 116* 91   Electrolytes  Recent Labs Lab 09/05/13 0545  09/06/13 0915 09/07/13 0050 09/07/13 0420  CALCIUM 6.4*  < > 6.8* 7.2* 7.5*  MG 1.9  --  1.7  --   --   < > = values in this interval not displayed. Sepsis Markers  Recent Labs Lab 09/04/13 0837 09/05/13 1100 09/05/13 1107 09/06/13 0500 09/07/13 0420  LATICACIDVEN 1.28 1.9  --   --   --   PROCALCITON  --   --  1.19 1.28 0.57   ABG No results found for this basename: PHART, PCO2ART, PO2ART,  in the last 168 hours Liver Enzymes  Recent Labs Lab 09/04/13 0819  AST 10  ALT 9  ALKPHOS 84  BILITOT <0.2*  ALBUMIN 3.0*   Cardiac Enzymes  Recent Labs Lab 09/04/13 0819  TROPONINI <0.30   Glucose No results found for this basename: GLUCAP,  in the last 168 hours  Imaging No results found.   ASSESSMENT / PLAN:  PULMONARY OETT n/a A: No acute issues P:    Mobilize   CARDIOVASCULAR CVL 8/12 >>  A:  Septic Shock - in setting of dehydration, AKI.  Lactate negative. Hx HTN Hx CHF P:  Neosynephrine gtt  off  Dc CVP   RENAL A:   AKI - secondary to NSAID + lisinopril usage, improving. Baseline sr cr ~1.3 Hyperkalemia - Resolved.  AG Metabolic Acidosis - secondary to AKI, +/- RTA.  8/12 Gap  19 Hypokalemia Hypocalcemia - corrected calcium for albumin 7.2 P:   Trend BMP daily - follow lytes Off Bicarb gtt  Nephrology Following   HEMATOLOGIC A:   Anemia - of chronic disease Hx of DVT  P:  Monitor CBC Heparin SQ  INFECTIOUS A:   Chronic LE Venous Stasis Ulcerations No evidence of acute infection but cannot r/o sepsis from wounds P:    - dc vanc WOC consult  ENDOCRINE A:  TSH & free T3 low - ? Sick euthyroid P:   Follow glucose on BMP   NEUROLOGIC A:   Myasthenia Gravis - without acute flare Pain  P:   Continue Mestinon Neurology evaluated Vicodin, Oxy for pain   TODAY'S SUMMARY: 53 y/o F with MG & chronic LE venous stasis wounds who was self medicating with ibuprofen for LE pain.  Admitted with AKI in the setting of NSAID + ACE-I usage.  Resolving AKI , off neosynephrine, presumed septic shock    I have personally obtained a history, examined the patient, evaluated laboratory and imaging results, formulated the assessment and plan and placed orders.  Can transfer to floor PCCM to sign off, pl remove CVL soon  Rigoberto Noel. MD   09/07/2013, 8:48 AM

## 2013-09-07 NOTE — Progress Notes (Signed)
[facility] PROGRESS NOTE  [name] WUJ:[id number] DOB: Jun 10, 1960 DOA: 09/04/2013 PCP: MATTHEWS,MICHELLE A., MD   Brief narrative 53 year old female with history of myasthenia gravis on Mestinon (follows with Dr. Krista Blue), chronic lower extremity venous stasis ulcers for past 10 years, hypertension, history of rheumatoid arthritis history of DVT, asthma, who presented to the ED with shortness of breath and wheezing. Patient was found to be in acute kidney injury with markedly elevated BUN and creatinine of 92/7.7 respectively with anion gap metabolic acidosis and hypocalcemia.Marland Kitchen She was also hyponatremic with sodium of 126 and potassium of 7. Apparently patient was taking NSAIDs regularly and was also on lisinopril/HCTZ for her blood pressure. Patient admitted to step down. Overnight on admission patient became hypotensive with systolic blood pressure in the 60s and low MAP despite multiple IV fluid boluses and been on bicarbonate drip. She was started on Neo-Synephrine.   Assessment/Plan: Septic shock Likely secondary to hypovolemia with dehydration and acute kidney injury. Patient  requiring pressors with Neo-Synephrine drip. Patient had a central line placed by PCCM on 8/12. cx negative. Empirically placed on IV vancomycin on 8/12 for sepsis . BP now stable and off pressors since this am. continue IV NS cx negative. Will d/c abx. transfer to medical floor.   Acute kidney injury Likely in the setting of dehydration and use of NSAIDs along with HCTZ and lisinopril. Have held all the medications. Received IV fluids with bicarbonate and metabolic acidosis now resolved. Renal fn improving. Off foley. Appreciate renal recommendations.   Hypocalcemia Replenished  With several doses of  IV calcium gluconate. Normal magnesium level.    Hyperkalemia/ hopokalemia On admission. Possibly related to ACEi and NSAIDs. Given Kayexalate. Developed hypokalemia requiring aggressive k  replenishment. Now improved   Chronic venous stasis ulcers Does not appear infected clinically. Wound care consult placed. Started on empiric vancomycin by [facility]. Will d/c  Low TSH Low  free T3 and normal T4. Possibly acute related to sepsis  Myasthenia gravis Appears stable. Seen by neurology and recommended to continue Mestinon.  History of rheumatoid arthritis Currently stable. Reports planning to follow up with rheumatology as outpatient.  DVT prophylaxis: Subcutaneous heparin  GI prophylaxis: PPI  Diet: Regular   Code Status: Full code Family Communication: None at bedside  Disposition Plan: tx to med flor. Wishes to go home with [facility]   Consultants:  PCCM   renal   neurology  Procedures:  Central line placed 8/12  Antibiotics:  IV vancomycin 8/12  HPI/Subjective: Patient seen and examined this morning. Noted for low blood pressure overnight did not improve with IV bolus and and requiring pressors with Neo-Synephrine. Reports having chills.  Objective: Filed Vitals:   09/07/13 1200  BP: 98/58  Pulse:   Temp:   Resp: 13    Intake/Output Summary (Last 24 hours) at 09/07/13 1253 Last data filed at 09/07/13 1200  Gross per 24 hour  Intake 1941.23 ml  Output   1116 ml  Net 825.23 ml   Filed Weights   09/04/13 1400 09/07/13 0700  Weight: 87.2 kg (192 lb 3.9 oz) 99 kg (218 lb 4.1 oz)    Exam:   General:  no acute distress  HEENT: No pallor, moist oral mucosa,   Chest: Clear to auscultation bilaterally, no added sounds  Abdomen: Soft, nontender, nondistended, bowel sounds present  Extremities: Warm, bilateral chronic venous stasis ulcers,   CNS: Alert and oriented.  Data Reviewed: Basic Metabolic Panel:  Recent Labs Lab 09/05/13 0545 09/05/13 1107  09/05/13 2307 09/06/13 0915 09/07/13 0050 09/07/13 0420  NA 137 136* 137 139 138 142  K 3.0* 2.4* 2.4* 2.7* 3.1* 3.7  CL 101 98 99 98 100 103  CO2 17* 21 25 29 28 29   GLUCOSE 104*  121* 125* 124* 116* 91  BUN 69* 60* 48* 41* 37* 35*  CREATININE 3.71* 2.76* 1.89* 1.57* 1.40* 1.35*  CALCIUM 6.4* 6.4* 6.4* 6.8* 7.2* 7.5*  MG 1.9  --   --  1.7  --   --    Liver Function Tests:  Recent Labs Lab 09/04/13 0819  AST 10  ALT 9  ALKPHOS 84  BILITOT <0.2*  PROT 8.0  ALBUMIN 3.0*   No results found for this basename: LIPASE, AMYLASE,  in the last 168 hours No results found for this basename: AMMONIA,  in the last 168 hours CBC:  Recent Labs Lab 09/04/13 0819 09/05/13 0545 09/06/13 0500 09/07/13 0420  WBC 10.9* 10.7* 10.7* 6.0  NEUTROABS 9.9*  --   --   --   HGB 11.6* 9.2* 8.3* 8.5*  HCT 35.5* 27.2* 24.7* 26.6*  MCV 69.3* 67.5* 67.1* 70.2*  PLT 332 212 200 211   Cardiac Enzymes:  Recent Labs Lab 09/04/13 0819  TROPONINI <0.30   BNP (last 3 results) No results found for this basename: PROBNP,  in the last 8760 hours CBG: No results found for this basename: GLUCAP,  in the last 168 hours  Recent Results (from the past 240 hour(s))  MRSA PCR SCREENING     Status: Abnormal   Collection Time    09/04/13  2:30 PM      Result Value Ref Range Status   MRSA by PCR POSITIVE (*) NEGATIVE Final   Comment:            The GeneXpert MRSA Assay (FDA     approved for NASAL specimens     only), is one component of a     comprehensive MRSA colonization     surveillance program. It is not     intended to diagnose MRSA     infection nor to guide or     monitor treatment for     MRSA infections.     RESULT CALLED TO, READ BACK BY AND VERIFIED WITH:     BULLINS,H AT 7846 ON 09/04/13 BY INGLEE   CULTURE, BLOOD (ROUTINE X 2)     Status: None   Collection Time    09/05/13 11:53 AM      Result Value Ref Range Status   Specimen Description BLOOD LEFT HAND   Final   Special Requests BOTTLES DRAWN AEROBIC ONLY 1CC   Final   Culture  Setup Time     Final   Value: 09/05/2013 14:07     Performed at [facility]   Culture     Final   Value:        BLOOD  CULTURE RECEIVED NO GROWTH TO DATE CULTURE WILL BE HELD FOR 5 DAYS BEFORE ISSUING A FINAL NEGATIVE REPORT     Performed at [facility]   Report Status PENDING   Incomplete  CULTURE, BLOOD (ROUTINE X 2)     Status: None   Collection Time    09/05/13 11:55 AM      Result Value Ref Range Status   Specimen Description BLOOD RIGHT FRESH JUGULAR   Final   Special Requests BOTTLES DRAWN AEROBIC AND ANAEROBIC 10CC   Final   Culture  Setup  Time     Final   Value: 09/05/2013 14:07     Performed at [facility]   Culture     Final   Value:        BLOOD CULTURE RECEIVED NO GROWTH TO DATE CULTURE WILL BE HELD FOR 5 DAYS BEFORE ISSUING A FINAL NEGATIVE REPORT     Performed at [facility]   Report Status PENDING   Incomplete     Studies: No results found.  Scheduled Meds: . Chlorhexidine Gluconate Cloth  6 each Topical Q0600  . collagenase   Topical Daily  . heparin  5,000 Units Subcutaneous 3 times per day  . mupirocin ointment  1 application Nasal BID  . pyridostigmine  60 mg Oral QID  . sodium chloride  250 mL Intravenous Once  . sodium chloride  500 mL Intravenous Once  . sodium chloride  3 mL Intravenous Q12H   Continuous Infusions: . sodium chloride 50 mL/hr at 09/06/13 1300  . phenylephrine (NEO-SYNEPHRINE) Adult infusion Stopped (09/07/13 0432)      Time spent: 35 minutes    Jaidon Ellery, [place]  [facility] Pager [phone]. If 7PM-7AM, please contact night-coverage at [email or web address], password [email or web address] 09/07/2013, 12:53 PM  LOS: 3 days

## 2013-09-08 DIAGNOSIS — N179 Acute kidney failure, unspecified: Secondary | ICD-10-CM | POA: Diagnosis not present

## 2013-09-08 DIAGNOSIS — G7000 Myasthenia gravis without (acute) exacerbation: Secondary | ICD-10-CM | POA: Diagnosis not present

## 2013-09-08 DIAGNOSIS — L97909 Non-pressure chronic ulcer of unspecified part of unspecified lower leg with unspecified severity: Secondary | ICD-10-CM | POA: Diagnosis not present

## 2013-09-08 LAB — BASIC METABOLIC PANEL
Anion gap: 12 (ref 5–15)
BUN: 27 mg/dL — AB (ref 6–23)
CO2: 25 mEq/L (ref 19–32)
CREATININE: 1.12 mg/dL — AB (ref 0.50–1.10)
Calcium: 8 mg/dL — ABNORMAL LOW (ref 8.4–10.5)
Chloride: 102 mEq/L (ref 96–112)
GFR calc Af Amer: 63 mL/min — ABNORMAL LOW (ref >90)
GFR, EST NON AFRICAN AMERICAN: 55 mL/min — AB (ref >90)
Glucose, Bld: 85 mg/dL (ref 70–99)
POTASSIUM: 3.9 meq/L (ref 3.7–5.3)
Sodium: 139 mEq/L (ref 137–147)

## 2013-09-08 NOTE — Progress Notes (Signed)
[facility] PROGRESS NOTE  [name] [id number]:[id number] DOB: Jun 05, 1960 DOA: 09/04/2013 PCP: MATTHEWS,MICHELLE A., MD   Brief narrative 53 year old female with history of myasthenia gravis on Mestinon (follows with Dr. Krista Blue), chronic lower extremity venous stasis ulcers for past 10 years, hypertension, history of rheumatoid arthritis history of DVT, asthma, who presented to the ED with shortness of breath and wheezing. Patient was found to be in acute kidney injury with markedly elevated BUN and creatinine of 92/7.7 respectively with anion gap metabolic acidosis and hypocalcemia.Marland Kitchen She was also hyponatremic with sodium of 126 and potassium of 7. Apparently patient was taking NSAIDs regularly and was also on lisinopril/HCTZ for her blood pressure. Patient admitted to step down. Overnight on admission patient became hypotensive with systolic blood pressure in the 60s and low MAP despite multiple IV fluid boluses and been on bicarbonate drip. She was started on Neo-Synephrine.   Assessment/Plan: Septic shock Likely secondary to hypovolemia with dehydration and acute kidney injury. Patient required pressors with Neo-Synephrine drip. Patient had a central line placed by PCCM on 8/12. cx negative. Empirically placed on IV vancomycin on 8/12 for possible sepsis and now discontinued BP now stable and off pressors and IV fluids    Acute kidney injury Likely in the setting of dehydration and use of NSAIDs along with HCTZ and lisinopril. Have held all the medications. Received IV fluids with bicarbonate and metabolic acidosis now resolved. Renal fn improving. Off foley. Appreciate renal recommendations.   Hypocalcemia Replenished  with several doses of  IV calcium gluconate. Normal magnesium level.    Hyperkalemia/ hopokalemia On admission. Possibly related to ACEi and NSAIDs. Given Kayexalate. Developed hypokalemia requiring aggressive k replenishment. Now improved   Chronic venous stasis  ulcers Does not appear infected clinically. Wound care consult appreciated. Recommend Xeroform gauze and use calcium alginate on top to assist with nausea management. We'll plan on home health and needs outpatient referral to [place].   Low TSH Low  free T3 and normal T4. Possibly acute related to sepsis  Myasthenia gravis Appears stable. Seen by neurology and recommended to continue Mestinon.  History of rheumatoid arthritis Currently stable. Reports planning to follow up with rheumatology as outpatient.  DVT prophylaxis: Subcutaneous heparin  GI prophylaxis: PPI  Diet: Regular   Code Status: Full code Family Communication: None at bedside  Disposition Plan: Home with home health RN and PT tomorrow.   Consultants:  PCCM   renal   neurology  Procedures:  Central line placed 8/12  Antibiotics:  IV vancomycin 8/12--8/14  HPI/Subjective: Patient seen and examined this morning. Denies any symptoms. No Overnight issues   Objective: Filed Vitals:   09/08/13 0444  BP: 119/60  Pulse: 74  Temp: 97.6 F (36.4 C)  Resp: 18    Intake/Output Summary (Last 24 hours) at 09/08/13 1434 Last data filed at 09/08/13 1308  Gross per 24 hour  Intake    920 ml  Output    500 ml  Net    420 ml   Filed Weights   09/04/13 1400 09/07/13 0700  Weight: 87.2 kg (192 lb 3.9 oz) 99 kg (218 lb 4.1 oz)    Exam:   General:  no acute distress  HEENT: No pallor, moist oral mucosa,   Chest: Clear to auscultation bilaterally,   CVS: Normal S1 and S2  Abdomen: Soft, nontender, nondistended, bowel sounds present  Extremities: Warm, bilateral chronic venous stasis ulcers,   CNS: Alert and oriented.  Data Reviewed: Basic Metabolic Panel:  Recent Labs Lab 09/05/13 0545  09/05/13 2307 09/06/13 0915 09/07/13 0050 09/07/13 0420 09/08/13 0413  NA 137  < > 137 139 138 142 139  K 3.0*  < > 2.4* 2.7* 3.1* 3.7 3.9  CL 101  < > 99 98 100 103 102  CO2 17*  < > 25  29 28 29 25   GLUCOSE 104*  < > 125* 124* 116* 91 85  BUN 69*  < > 48* 41* 37* 35* 27*  CREATININE 3.71*  < > 1.89* 1.57* 1.40* 1.35* 1.12*  CALCIUM 6.4*  < > 6.4* 6.8* 7.2* 7.5* 8.0*  MG 1.9  --   --  1.7  --   --   --   < > = values in this interval not displayed. Liver Function Tests:  Recent Labs Lab 09/04/13 0819  AST 10  ALT 9  ALKPHOS 84  BILITOT <0.2*  PROT 8.0  ALBUMIN 3.0*   No results found for this basename: LIPASE, AMYLASE,  in the last 168 hours No results found for this basename: AMMONIA,  in the last 168 hours CBC:  Recent Labs Lab 09/04/13 0819 09/05/13 0545 09/06/13 0500 09/07/13 0420  WBC 10.9* 10.7* 10.7* 6.0  NEUTROABS 9.9*  --   --   --   HGB 11.6* 9.2* 8.3* 8.5*  HCT 35.5* 27.2* 24.7* 26.6*  MCV 69.3* 67.5* 67.1* 70.2*  PLT 332 212 200 211   Cardiac Enzymes:  Recent Labs Lab 09/04/13 0819  TROPONINI <0.30   BNP (last 3 results) No results found for this basename: PROBNP,  in the last 8760 hours CBG: No results found for this basename: GLUCAP,  in the last 168 hours  Recent Results (from the past 240 hour(s))  MRSA PCR SCREENING     Status: Abnormal   Collection Time    09/04/13  2:30 PM      Result Value Ref Range Status   MRSA by PCR POSITIVE (*) NEGATIVE Final   Comment:            The GeneXpert MRSA Assay (FDA     approved for NASAL specimens     only), is one component of a     comprehensive MRSA colonization     surveillance program. It is not     intended to diagnose MRSA     infection nor to guide or     monitor treatment for     MRSA infections.     RESULT CALLED TO, READ BACK BY AND VERIFIED WITH:     BULLINS,H AT 7591 ON 09/04/13 BY INGLEE   CULTURE, BLOOD (ROUTINE X 2)     Status: None   Collection Time    09/05/13 11:53 AM      Result Value Ref Range Status   Specimen Description BLOOD LEFT HAND   Final   Special Requests BOTTLES DRAWN AEROBIC ONLY 1CC   Final   Culture  Setup Time     Final   Value: 09/05/2013  14:07     Performed at [facility]   Culture     Final   Value:        BLOOD CULTURE RECEIVED NO GROWTH TO DATE CULTURE WILL BE HELD FOR 5 DAYS BEFORE ISSUING A FINAL NEGATIVE REPORT     Performed at [facility]   Report Status PENDING   Incomplete  CULTURE, BLOOD (ROUTINE X 2)     Status: None   Collection Time  09/05/13 11:55 AM      Result Value Ref Range Status   Specimen Description BLOOD RIGHT FRESH JUGULAR   Final   Special Requests BOTTLES DRAWN AEROBIC AND ANAEROBIC 10CC   Final   Culture  Setup Time     Final   Value: 09/05/2013 14:07     Performed at [facility]   Culture     Final   Value:        BLOOD CULTURE RECEIVED NO GROWTH TO DATE CULTURE WILL BE HELD FOR 5 DAYS BEFORE ISSUING A FINAL NEGATIVE REPORT     Performed at [facility]   Report Status PENDING   Incomplete     Studies: No results found.  Scheduled Meds: . Chlorhexidine Gluconate Cloth  6 each Topical Q0600  . collagenase   Topical Daily  . heparin  5,000 Units Subcutaneous 3 times per day  . mupirocin ointment  1 application Nasal BID  . pyridostigmine  60 mg Oral QID  . sodium chloride  250 mL Intravenous Once  . sodium chloride  500 mL Intravenous Once  . sodium chloride  3 mL Intravenous Q12H   Continuous Infusions: . sodium chloride 50 mL/hr at 09/07/13 2229      Time spent: 25 minutes    Avary Pitsenbarger, [place]  [facility] Pager [phone]. If 7PM-7AM, please contact night-coverage at [email or web address], password [facility] 09/08/2013, 2:34 PM  LOS: 4 days

## 2013-09-09 DIAGNOSIS — A419 Sepsis, unspecified organism: Secondary | ICD-10-CM | POA: Diagnosis not present

## 2013-09-09 DIAGNOSIS — N179 Acute kidney failure, unspecified: Secondary | ICD-10-CM | POA: Diagnosis not present

## 2013-09-09 DIAGNOSIS — L97909 Non-pressure chronic ulcer of unspecified part of unspecified lower leg with unspecified severity: Secondary | ICD-10-CM | POA: Diagnosis not present

## 2013-09-09 DIAGNOSIS — I959 Hypotension, unspecified: Secondary | ICD-10-CM | POA: Diagnosis not present

## 2013-09-09 DIAGNOSIS — E872 Acidosis, unspecified: Secondary | ICD-10-CM | POA: Diagnosis present

## 2013-09-09 DIAGNOSIS — D509 Iron deficiency anemia, unspecified: Secondary | ICD-10-CM | POA: Diagnosis present

## 2013-09-09 DIAGNOSIS — E875 Hyperkalemia: Secondary | ICD-10-CM | POA: Diagnosis present

## 2013-09-09 LAB — BASIC METABOLIC PANEL
Anion gap: 11 (ref 5–15)
BUN: 21 mg/dL (ref 6–23)
CALCIUM: 8.2 mg/dL — AB (ref 8.4–10.5)
CO2: 24 meq/L (ref 19–32)
Chloride: 107 mEq/L (ref 96–112)
Creatinine, Ser: 1.05 mg/dL (ref 0.50–1.10)
GFR calc Af Amer: 68 mL/min — ABNORMAL LOW (ref >90)
GFR calc non Af Amer: 59 mL/min — ABNORMAL LOW (ref >90)
GLUCOSE: 92 mg/dL (ref 70–99)
POTASSIUM: 3.9 meq/L (ref 3.7–5.3)
Sodium: 142 mEq/L (ref 137–147)

## 2013-09-09 MED ORDER — MUPIROCIN 2 % EX OINT: 1.0000 "application " | TOPICAL_OINTMENT | Freq: Two times a day (BID) | CUTANEOUS | Status: DC

## 2013-09-09 MED ORDER — CHLORHEXIDINE GLUCONATE CLOTH 2 % EX PADS: 6.0000 | MEDICATED_PAD | Freq: Every day | CUTANEOUS | Status: DC

## 2013-09-09 MED ORDER — COLLAGENASE 250 UNIT/GM EX OINT: TOPICAL_OINTMENT | Freq: Every day | CUTANEOUS | Status: DC

## 2013-09-09 MED ORDER — DIPHENHYDRAMINE HCL 25 MG PO TABS: 25.0000 mg | ORAL_TABLET | Freq: Four times a day (QID) | ORAL | Status: AC | PRN

## 2013-09-09 MED ORDER — OXYCODONE HCL 5 MG PO TABS: 5.0000 mg | ORAL_TABLET | ORAL | Status: DC | PRN

## 2013-09-09 NOTE — Progress Notes (Signed)
CARE MANAGEMENT NOTE 09/09/2013  Patient:  [name],[name]   Account Number:  [id number]  Date Initiated:  09/05/2013  Documentation initiated by:  DAVIS,RHONDA  Subjective/Objective Assessment:   53 y/o F with a PMH of HTN, CHF, Asthma, Myasthenia Gravis admitted on 8/11 with complaints of SOB/Wheezing, increased LE pain taking up to 40 tabs of ibuprofen QD.  ER work up found the patient to have acute kidney injury, metabolic acidos     Action/Plan:   home with hhc   Anticipated DC Date:  09/08/2013   Anticipated DC Plan:  [place]  In-house referral  NA      DC Planning Services  CM consult      [facility] Choice  HOME HEALTH   Choice offered to / List presented to:  C-1 Patient   DME arranged  NA      DME agency  NA     [place] arranged  HH-1 RN  [place]   Status of service:  Completed, signed off Medicare Important Message given?  YES (If response is "NO", the following Medicare IM given date fields will be blank) Date Medicare IM given:  09/09/2013 Medicare IM given by:  [facility] Date Additional Medicare IM given:   Additional Medicare IM given by:    Discharge Disposition:  [place]  Per UR Regulation:  Reviewed for med. necessity/level of care/duration of stay  If discussed at [place] of Stay Meetings, dates discussed:    Comments:  09/09/2013 1530 Received call from [place] rep and they cannot accept referral. NCM contacted [facility] for [facility]. Notified pt of change in [facility] agency. Left message on voicemail for pt to return call.  Jonnie Finner RN CCM Case Mgmt phone [phone]   09/09/2013 1000 NCM spoke to pt and states she requested [facility] for [facility]. NCM notified [facility] rep to see if they can accept referral for [facility]. Pt states she lives at home alone but able to manage at home. She has RW, bedside commode and hospital bed at home. Unit RN will do dressing change. Requested [facility] RN soc  for 09/10/2013.  Jonnie Finner RN CCM Case Mgmt phone [phone]   [facility]

## 2013-09-09 NOTE — Progress Notes (Signed)
CARE MANAGEMENT NOTE 09/09/2013  Patient:  [name],[name]   Account Number:  [id number]  Date Initiated:  09/05/2013  Documentation initiated by:  DAVIS,RHONDA  Subjective/Objective Assessment:   53 y/o F with a PMH of HTN, CHF, Asthma, Myasthenia Gravis admitted on 8/11 with complaints of SOB/Wheezing, increased LE pain taking up to 40 tabs of ibuprofen QD.  ER work up found the patient to have acute kidney injury, metabolic acidos     Action/Plan:   home with hhc   Anticipated DC Date:  09/08/2013   Anticipated DC Plan:  [place]  In-house referral  NA      DC Planning Services  CM consult      [facility] Choice  HOME HEALTH   Choice offered to / List presented to:  C-1 Patient   DME arranged  NA      DME agency  NA     [place] arranged  HH-1 RN  [place]   Status of service:  Completed, signed off Medicare Important Message given?  YES (If response is "NO", the following Medicare IM given date fields will be blank) Date Medicare IM given:  09/09/2013 Medicare IM given by:  [facility] Date Additional Medicare IM given:   Additional Medicare IM given by:    Discharge Disposition:  [place]  Per UR Regulation:  Reviewed for med. necessity/level of care/duration of stay  If discussed at [place] of Stay Meetings, dates discussed:    Comments:  09/09/2013 1000 NCM spoke to pt and states she requested [facility] for [facility]. NCM notified [facility] rep to see if they can accept referral for [facility]. Pt states she lives at home alone but able to manage at home. She has RW, bedside commode and hospital bed at home. Unit RN will do dressing change. Requested [facility] RN soc for 09/10/2013.  Jonnie Finner RN CCM Case Mgmt phone [phone]   [facility]

## 2013-09-09 NOTE — Plan of Care (Signed)
Problem: Phase III Progression Outcomes Goal: Discharge plan remains appropriate-arrangements made Outcome: Completed/Met Date Met:  09/09/13 Discharged to home.Returned home by [facility]. HH to visit pt 8/17

## 2013-09-09 NOTE — Discharge Summary (Signed)
Physician Discharge Summary  [name] BZJ:[id number] DOB: 17-Sep-1960 DOA: 09/04/2013  PCP: MATTHEWS,MICHELLE A., MD  Admit date: 09/04/2013 Discharge date: 09/09/2013  Time spent: 35 minutes  Recommendations for Outpatient Follow-up:  1. D/c home with [facility] and PT 2. Follow up with PCP in 1 week. recommend outpt referral to wound care center for lower extremity ulcers   Discharge Diagnoses:  Principal Problem:   Septic shock   Active Problems:   Hypokalemia   AKI (acute kidney injury)   Hyperkalemia   Myasthenia gravis   Hypertension   Venous stasis ulcers, lower extremities   Rheumatoid arthritis   Hyponatremia   DVT (deep venous thrombosis), HO/o not on AC now   Metabolic acidosis, increased anion gap (IAG)   Hypotension, unspecified   Iron deficiency anemia   Discharge Condition: fair  Diet recommendation: low sodium  Filed Weights   09/04/13 1400 09/07/13 0700  Weight: 87.2 kg (192 lb 3.9 oz) 99 kg (218 lb 4.1 oz)    History of present illness:  Please refer t admission H&P for details, but in brief, 53 year old female with history of myasthenia gravis on Mestinon (follows with Dr. Krista Blue), chronic lower extremity venous stasis ulcers for past 10 years, hypertension, history of rheumatoid arthritis history of DVT, asthma, who presented to the ED with shortness of breath and wheezing. Patient was found to be in acute kidney injury with markedly elevated BUN and creatinine of 92/7.7 respectively with anion gap metabolic acidosis and hypocalcemia.Marland Kitchen She was also hyponatremic with sodium of 126 and potassium of 7. Apparently patient was taking NSAIDs regularly and was also on lisinopril/HCTZ for her blood pressure.  Patient admitted to step down. Overnight on admission patient became hypotensive with systolic blood pressure in the 60s and low MAP despite multiple IV fluid boluses and been on bicarbonate drip. She was started on Neo-Synephrine. BP and renal function  started to improve with  use of pressure and aggressive IV hydration.      Hospital Course:  Septic shock  Likely secondary to hypovolemia with dehydration and acute kidney injury.  Patient required pressors with Neo-Synephrine drip. Patient had a central line placed by PCCM on 8/12. cx negative. Empirically placed on IV vancomycin on 8/12 for possible sepsis and now discontinued  BP now stable and off pressors and IV fluids . Will resume low home dose lisinopril upon discharge as renal fn now stable and BP improving.   Acute kidney injury with metabolic acidosis Likely in the setting of dehydration and use of NSAIDs along with HCTZ and lisinopril.  held all the medications. Received aggressive  IV fluids with bicarbonate and metabolic acidosis now resolved. Renal fn improved.  Appreciate renal recommendations.   Hypocalcemia  Replenished with several doses of IV calcium gluconate. Normal magnesium level.   Hyperkalemia/ hopokalemia  Hyperkalemia On admission secondary to ATN and use of ACEi and NSAIDs. Given Kayexalate. Developed hypokalemia requiring aggressive k replenishment. Now improved .  Chronic venous stasis ulcers  Not infected . Wound care consult appreciated. Recommend Xeroform gauze and use calcium alginate on top to assist with ulcer management. We'll arrange home health RN and needs outpatient referral to [place].   Low TSH  Low free T3 and normal T4. Possibly acute related to sepsis   Myasthenia gravis  Appears stable. Seen by neurology and recommended to continue Mestinon.   History of rheumatoid arthritis  Currently stable. Reports planning to follow up with rheumatology as outpatient. continue oxycodone. Have instructed clearly  to avoid NSAIDs.  Iron deficiency anemia  continue iron supplements   Weakness Seen by PT and recommended SNF, but pt refused. With clinical improvement over the past few days, She is stable to be discharged home with  HHPT.   Diet: low sodium  Code Status: Full code   Family Communication: None at bedside   Disposition Plan: Home with home health RN and PT    Consultants:  PCCM  renal  Neurology   Procedures:  Central line placed 8/12   Antibiotics:  IV vancomycin 8/12--8/14     Discharge Exam: Filed Vitals:   09/09/13 0451  BP: 130/72  Pulse: 87  Temp: 97.3 F (36.3 C)  Resp: 16    General: middle aged female in no acute distress  HEENT: No pallor, moist oral mucosa,  Chest: Clear to auscultation bilaterally,  CVS: Normal S1 and S2 , no murmurs Abdomen: Soft, nontender, nondistended, bowel sounds present  Extremities: Warm, bilateral chronic venous stasis ulcers, distal pulses palpable CNS: Alert and oriented.   Discharge Instructions You were cared for by a hospitalist during your hospital stay. If you have any questions about your discharge medications or the care you received while you were in the hospital after you are discharged, you can call the unit and asked to speak with the hospitalist on call if the hospitalist that took care of you is not available. Once you are discharged, your primary care physician will handle any further medical issues. Please note that NO REFILLS for any discharge medications will be authorized once you are discharged, as it is imperative that you return to your primary care physician (or establish a relationship with a primary care physician if you do not have one) for your aftercare needs so that they can reassess your need for medications and monitor your lab values.     Medication List    STOP taking these medications       ibuprofen 200 MG tablet  Commonly known as:  ADVIL,MOTRIN     POTASSIUM GLUCONATE PO      TAKE these medications       albuterol 108 (90 BASE) MCG/ACT inhaler  Commonly known as:  PROVENTIL HFA;VENTOLIN HFA  Inhale 4 puffs into the lungs every 6 (six) hours as needed. For shortness of breath      Chlorhexidine Gluconate Cloth 2 % Pads  Apply 6 each topically daily at 6 (six) AM.     COD LIVER OIL PO  Take 2 capsules by mouth daily.     collagenase ointment  Commonly known as:  SANTYL  Apply topically daily. Over legs     diphenhydrAMINE 25 MG tablet  Commonly known as:  BENADRYL  Take 1 tablet (25 mg total) by mouth every 6 (six) hours as needed.     EPIPEN 0.3 mg/0.3 mL Devi  Generic drug:  EPINEPHrine  Inject 0.3 mg into the muscle as needed. For allergic reaction     ferrous sulfate 325 (65 FE) MG tablet  Take 325-650 mg by mouth daily with breakfast.     Fish Oil 500 MG Caps  Take 1,000 mg by mouth daily.     folic acid 998 MCG tablet  Commonly known as:  FOLVITE  Take 400 mcg by mouth daily.     Garlic Oil 3382 MG Caps  Take 1,000 mg by mouth daily.     lidocaine 4 % external solution  Commonly known as:  XYLOCAINE  Apply 5 mLs topically 4 (  four) times daily.     lisinopril 10 MG tablet  Commonly known as:  PRINIVIL,ZESTRIL  Take 1 tablet (10 mg total) by mouth daily.     multivitamin with minerals Tabs tablet  Take 1 tablet by mouth daily. 1 gummy     mupirocin ointment 2 %  Commonly known as:  BACTROBAN  Place 1 application into the nose 2 (two) times daily. Bilateral legs     nivea cream  Apply 1 application topically daily. Apply all over body     oxyCODONE 5 MG immediate release tablet  Commonly known as:  Oxy IR/ROXICODONE  Take 1 tablet (5 mg total) by mouth every 4 (four) hours as needed.     pyridostigmine 60 MG tablet  Commonly known as:  MESTINON  Take 60 mg by mouth 4 (four) times daily.     ZINC PO  Take 1 tablet by mouth daily.       Allergies  Allergen Reactions  . Asa [Aspirin] Shortness Of Breath and Rash  . Dilaudid [Hydromorphone Hcl] Shortness Of Breath and Palpitations  . Erythromycin Other (See Comments)    Burn veins  . Indomethacin Other (See Comments)    Stroke symptoms  . Naproxen Other (See Comments)     Renal failure  . Alginate-Collagen [Wound Dressings] Rash  . Gold-Containing Drug Products Rash  . Silvadene [Silver Sulfadiazine] Rash  . Tylenol [Acetaminophen] Rash and Other (See Comments)    Salivation and hearing loss       Follow-up Information   Follow up with MATTHEWS,MICHELLE A., MD. Schedule an appointment as soon as possible for a visit in 1 week.   Specialty:  Internal Medicine   Contact information:   [place] [address] [phone]        The results of significant diagnostics from this hospitalization (including imaging, microbiology, ancillary and laboratory) are listed below for reference.    Significant Diagnostic Studies: Dg Chest 2 View  09/04/2013   CLINICAL DATA:  Shortness of breath.  Wheezing.  EXAM: CHEST  2 VIEW  COMPARISON:  10/21/2010  FINDINGS: Heart size and pulmonary vascularity are normal. No infiltrates or effusions. Slight peribronchial thickening consistent with bronchitis. No acute osseous abnormality.  IMPRESSION: Bronchitic changes.   Electronically Signed   By: Rozetta Nunnery M.D.   On: 09/04/2013 08:51   US Renal  09/04/2013   CLINICAL DATA:  Elevated renal function tests  EXAM: RENAL/URINARY TRACT ULTRASOUND COMPLETE  COMPARISON:  None.  FINDINGS: Right Kidney:  Length: 11.8 cm. Increased renal cortical echogenicity. No mass or hydronephrosis visualized.  Left Kidney:  Length: 10.5 cm. Increased renal cortical echogenicity. No mass or hydronephrosis visualized.  Bladder:  Appears normal for degree of bladder distention.  IMPRESSION: 1. No urolithiasis or obstructive uropathy. 2. Increased renal cortical echogenicity as can be seen with medical renal disease versus AKI.   Electronically Signed   By: Kathreen Devoid   On: 09/04/2013 10:47   Dg Chest Port 1 View  09/05/2013   CLINICAL DATA:  Central venous catheter placement.  EXAM: PORTABLE CHEST - 1 VIEW  COMPARISON:  Radiographs 11/2013 and 10/21/2010.  FINDINGS: 1121 hr.  Right IJ central venous catheter extends to the level of the SVC right atrial junction. There are lower lung volumes with mildly increased bibasilar atelectasis. No pneumothorax, confluent airspace opacity or significant pleural effusion is identified. The heart size and mediastinal contours are stable.  IMPRESSION: Central venous catheter placement as  described without complicating pneumothorax. Mild increase in bibasilar atelectasis.   Electronically Signed   By: Camie Patience M.D.   On: 09/05/2013 12:01    Microbiology: Recent Results (from the past 240 hour(s))  MRSA PCR SCREENING     Status: Abnormal   Collection Time    09/04/13  2:30 PM      Result Value Ref Range Status   MRSA by PCR POSITIVE (*) NEGATIVE Final   Comment:            The GeneXpert MRSA Assay (FDA     approved for NASAL specimens     only), is one component of a     comprehensive MRSA colonization     surveillance program. It is not     intended to diagnose MRSA     infection nor to guide or     monitor treatment for     MRSA infections.     RESULT CALLED TO, READ BACK BY AND VERIFIED WITH:     BULLINS,H AT [id number] ON 09/04/13 BY INGLEE   CULTURE, BLOOD (ROUTINE X 2)     Status: None   Collection Time    09/05/13 11:53 AM      Result Value Ref Range Status   Specimen Description BLOOD LEFT HAND   Final   Special Requests BOTTLES DRAWN AEROBIC ONLY 1CC   Final   Culture  Setup Time     Final   Value: 09/05/2013 14:07     Performed at [facility]   Culture     Final   Value:        BLOOD CULTURE RECEIVED NO GROWTH TO DATE CULTURE WILL BE HELD FOR 5 DAYS BEFORE ISSUING A FINAL NEGATIVE REPORT     Performed at [facility]   Report Status PENDING   Incomplete  CULTURE, BLOOD (ROUTINE X 2)     Status: None   Collection Time    09/05/13 11:55 AM      Result Value Ref Range Status   Specimen Description BLOOD RIGHT FRESH JUGULAR   Final   Special Requests BOTTLES DRAWN AEROBIC AND ANAEROBIC 10CC    Final   Culture  Setup Time     Final   Value: 09/05/2013 14:07     Performed at [facility]   Culture     Final   Value:        BLOOD CULTURE RECEIVED NO GROWTH TO DATE CULTURE WILL BE HELD FOR 5 DAYS BEFORE ISSUING A FINAL NEGATIVE REPORT     Performed at [facility]   Report Status PENDING   Incomplete     Labs: Basic Metabolic Panel:  Recent Labs Lab 09/05/13 0545  09/06/13 0915 09/07/13 0050 09/07/13 0420 09/08/13 0413 09/09/13 0421  NA 137  < > 139 138 142 139 142  K 3.0*  < > 2.7* 3.1* 3.7 3.9 3.9  CL 101  < > 98 100 103 102 107  CO2 17*  < > 29 28 29 25 24   GLUCOSE 104*  < > 124* 116* 91 85 92  BUN 69*  < > 41* 37* 35* 27* 21  CREATININE 3.71*  < > 1.57* 1.40* 1.35* 1.12* 1.05  CALCIUM 6.4*  < > 6.8* 7.2* 7.5* 8.0* 8.2*  MG 1.9  --  1.7  --   --   --   --   < > = values in this interval not displayed. Liver Function Tests:  Recent Labs Lab 09/04/13 0819  AST 10  ALT 9  ALKPHOS 84  BILITOT <0.2*  PROT 8.0  ALBUMIN 3.0*   No results found for this basename: LIPASE, AMYLASE,  in the last 168 hours No results found for this basename: AMMONIA,  in the last 168 hours CBC:  Recent Labs Lab 09/04/13 0819 09/05/13 0545 09/06/13 0500 09/07/13 0420  WBC 10.9* 10.7* 10.7* 6.0  NEUTROABS 9.9*  --   --   --   HGB 11.6* 9.2* 8.3* 8.5*  HCT 35.5* 27.2* 24.7* 26.6*  MCV 69.3* 67.5* 67.1* 70.2*  PLT 332 212 200 211   Cardiac Enzymes:  Recent Labs Lab 09/04/13 0819  TROPONINI <0.30   BNP: BNP (last 3 results) No results found for this basename: PROBNP,  in the last 8760 hours CBG: No results found for this basename: GLUCAP,  in the last 168 hours     Signed:  Louellen Molder  [facility] 09/09/2013, 8:46 AM

## 2013-09-09 NOTE — Progress Notes (Signed)
Discharged to home via [facility].  HH to visit pt 8/17. Scripts given to pt. D/C instructions explained to pt. Denies any questions.

## 2013-09-09 NOTE — Plan of Care (Signed)
Problem: Phase III Progression Outcomes Goal: Activity at appropriate level-compared to baseline (UP IN CHAIR FOR HEMODIALYSIS)  Outcome: Adequate for Discharge wheelchair

## 2013-09-10 DIAGNOSIS — G7001 Myasthenia gravis with (acute) exacerbation: Secondary | ICD-10-CM | POA: Diagnosis not present

## 2013-09-10 DIAGNOSIS — I872 Venous insufficiency (chronic) (peripheral): Secondary | ICD-10-CM | POA: Diagnosis not present

## 2013-09-10 DIAGNOSIS — I83009 Varicose veins of unspecified lower extremity with ulcer of unspecified site: Secondary | ICD-10-CM | POA: Diagnosis not present

## 2013-09-10 DIAGNOSIS — M069 Rheumatoid arthritis, unspecified: Secondary | ICD-10-CM | POA: Diagnosis not present

## 2013-09-10 DIAGNOSIS — D509 Iron deficiency anemia, unspecified: Secondary | ICD-10-CM | POA: Diagnosis not present

## 2013-09-10 DIAGNOSIS — R5381 Other malaise: Secondary | ICD-10-CM | POA: Diagnosis not present

## 2013-09-10 DIAGNOSIS — R269 Unspecified abnormalities of gait and mobility: Secondary | ICD-10-CM | POA: Diagnosis not present

## 2013-09-10 DIAGNOSIS — L97909 Non-pressure chronic ulcer of unspecified part of unspecified lower leg with unspecified severity: Secondary | ICD-10-CM | POA: Diagnosis not present

## 2013-09-10 NOTE — Progress Notes (Signed)
09/10/2013 0845 NCM spoke to pt and made her aware [facility] could not accept referral. Pt agreeable to [facility] for [facility]. Jonnie Finner RN CCM Case Mgmt phone [phone]

## 2013-09-11 DIAGNOSIS — G7001 Myasthenia gravis with (acute) exacerbation: Secondary | ICD-10-CM | POA: Diagnosis not present

## 2013-09-11 DIAGNOSIS — D509 Iron deficiency anemia, unspecified: Secondary | ICD-10-CM | POA: Diagnosis not present

## 2013-09-11 DIAGNOSIS — M069 Rheumatoid arthritis, unspecified: Secondary | ICD-10-CM | POA: Diagnosis not present

## 2013-09-11 DIAGNOSIS — R269 Unspecified abnormalities of gait and mobility: Secondary | ICD-10-CM | POA: Diagnosis not present

## 2013-09-11 DIAGNOSIS — I872 Venous insufficiency (chronic) (peripheral): Secondary | ICD-10-CM | POA: Diagnosis not present

## 2013-09-11 DIAGNOSIS — I83009 Varicose veins of unspecified lower extremity with ulcer of unspecified site: Secondary | ICD-10-CM | POA: Diagnosis not present

## 2013-09-11 DIAGNOSIS — L97909 Non-pressure chronic ulcer of unspecified part of unspecified lower leg with unspecified severity: Secondary | ICD-10-CM | POA: Diagnosis not present

## 2013-09-11 LAB — CULTURE, BLOOD (ROUTINE X 2)
Culture: NO GROWTH
Culture: NO GROWTH

## 2013-09-12 ENCOUNTER — Encounter: Payer: Self-pay | Admitting: Neurology

## 2013-09-13 ENCOUNTER — Telehealth: Payer: Self-pay

## 2013-09-13 ENCOUNTER — Telehealth: Payer: Self-pay | Admitting: Family Medicine

## 2013-09-13 DIAGNOSIS — R269 Unspecified abnormalities of gait and mobility: Secondary | ICD-10-CM | POA: Diagnosis not present

## 2013-09-13 DIAGNOSIS — D509 Iron deficiency anemia, unspecified: Secondary | ICD-10-CM | POA: Diagnosis not present

## 2013-09-13 DIAGNOSIS — G7001 Myasthenia gravis with (acute) exacerbation: Secondary | ICD-10-CM | POA: Diagnosis not present

## 2013-09-13 DIAGNOSIS — M069 Rheumatoid arthritis, unspecified: Secondary | ICD-10-CM | POA: Diagnosis not present

## 2013-09-13 DIAGNOSIS — I872 Venous insufficiency (chronic) (peripheral): Secondary | ICD-10-CM | POA: Diagnosis not present

## 2013-09-13 DIAGNOSIS — I83009 Varicose veins of unspecified lower extremity with ulcer of unspecified site: Secondary | ICD-10-CM | POA: Diagnosis not present

## 2013-09-13 DIAGNOSIS — E872 Acidosis, unspecified: Secondary | ICD-10-CM

## 2013-09-13 DIAGNOSIS — L97919 Non-pressure chronic ulcer of unspecified part of right lower leg with unspecified severity: Secondary | ICD-10-CM

## 2013-09-13 DIAGNOSIS — L97929 Non-pressure chronic ulcer of unspecified part of left lower leg with unspecified severity: Principal | ICD-10-CM

## 2013-09-13 DIAGNOSIS — I959 Hypotension, unspecified: Secondary | ICD-10-CM

## 2013-09-13 NOTE — Telephone Encounter (Signed)
No home health orders for labs have been placed. Please notify [facility] to collect BMP and CBC w/diff. Have them fax results to [phone]

## 2013-09-13 NOTE — Telephone Encounter (Signed)
Called and left message for patient's [facility] worker to call back for lab orders. Thanks!

## 2013-09-14 NOTE — Telephone Encounter (Signed)
Called and spoke with Diane [phone] with [facility], gave verbal orders for BMP and CBC and to fax them to [name]. Diane said this would be taken care of on Monday 09/17/2013. Thanks!

## 2013-09-17 DIAGNOSIS — I83009 Varicose veins of unspecified lower extremity with ulcer of unspecified site: Secondary | ICD-10-CM | POA: Diagnosis not present

## 2013-09-17 DIAGNOSIS — M069 Rheumatoid arthritis, unspecified: Secondary | ICD-10-CM | POA: Diagnosis not present

## 2013-09-17 DIAGNOSIS — I872 Venous insufficiency (chronic) (peripheral): Secondary | ICD-10-CM | POA: Diagnosis not present

## 2013-09-17 DIAGNOSIS — G7001 Myasthenia gravis with (acute) exacerbation: Secondary | ICD-10-CM | POA: Diagnosis not present

## 2013-09-17 DIAGNOSIS — R269 Unspecified abnormalities of gait and mobility: Secondary | ICD-10-CM | POA: Diagnosis not present

## 2013-09-17 DIAGNOSIS — D509 Iron deficiency anemia, unspecified: Secondary | ICD-10-CM | POA: Diagnosis not present

## 2013-09-17 DIAGNOSIS — L97909 Non-pressure chronic ulcer of unspecified part of unspecified lower leg with unspecified severity: Secondary | ICD-10-CM | POA: Diagnosis not present

## 2013-09-18 ENCOUNTER — Telehealth: Payer: Self-pay

## 2013-09-18 ENCOUNTER — Telehealth: Payer: Self-pay | Admitting: Internal Medicine

## 2013-09-18 DIAGNOSIS — I878 Other specified disorders of veins: Secondary | ICD-10-CM

## 2013-09-18 DIAGNOSIS — L97919 Non-pressure chronic ulcer of unspecified part of right lower leg with unspecified severity: Secondary | ICD-10-CM

## 2013-09-18 DIAGNOSIS — L97929 Non-pressure chronic ulcer of unspecified part of left lower leg with unspecified severity: Principal | ICD-10-CM

## 2013-09-18 NOTE — Telephone Encounter (Signed)
Received a call from [place] (Nurse from [facility]), She wanted to inform us that the wound care nurse has visited [name] and recommend the following orders for bilateral leg wound care:   1)Clean with Normal Saline  2)Apply Lotrisone cream  3)Apply Vaseline  4)Cover with opticell-ag  5)cover with ABD Pad  6) Wrap with Gause wrap from toe to knee. Repeat daily.   Donita also states wound care nurse recommends ABI be ordered for patient.   Please advise and if you would like this ordered for patient so I can call [facility] back and let them know. Thanks!   Donita [phone]

## 2013-09-18 NOTE — Telephone Encounter (Signed)
Called and left message on Donita's (home health care nurse) confidential voicemail that [name] would like to order wound dressing as described in previous message from today. Advised Donita if any questions or anything else was needed to return call to office. Thanks!

## 2013-09-18 NOTE — Telephone Encounter (Signed)
Order Wound dressings as recommended. ABI's have already been ordered.

## 2013-09-18 NOTE — Telephone Encounter (Signed)
Pt arrived at primary care clinic for a follow-up hospital visit; explained to patient that no appointment had been scheduled for today for her; appointment scheduled for September 3, 1:30pm; pt verbalizes understanding

## 2013-09-19 ENCOUNTER — Telehealth: Payer: Self-pay | Admitting: Internal Medicine

## 2013-09-19 DIAGNOSIS — L97919 Non-pressure chronic ulcer of unspecified part of right lower leg with unspecified severity: Secondary | ICD-10-CM

## 2013-09-19 DIAGNOSIS — L97929 Non-pressure chronic ulcer of unspecified part of left lower leg with unspecified severity: Principal | ICD-10-CM

## 2013-09-19 NOTE — Telephone Encounter (Signed)
Refill request for oxycodone 5mg . lov 08/30/2013. Please advise. Thanks!

## 2013-09-20 MED ORDER — OXYCODONE HCL 5 MG PO TABS: 5.0000 mg | ORAL_TABLET | ORAL | Status: DC | PRN

## 2013-09-20 NOTE — Telephone Encounter (Signed)
Prescription filled for Oxycodone 5 mg # 30 tabs

## 2013-09-21 ENCOUNTER — Telehealth: Payer: Self-pay

## 2013-09-21 DIAGNOSIS — I83009 Varicose veins of unspecified lower extremity with ulcer of unspecified site: Secondary | ICD-10-CM | POA: Diagnosis not present

## 2013-09-21 DIAGNOSIS — I872 Venous insufficiency (chronic) (peripheral): Secondary | ICD-10-CM | POA: Diagnosis not present

## 2013-09-21 DIAGNOSIS — R269 Unspecified abnormalities of gait and mobility: Secondary | ICD-10-CM | POA: Diagnosis not present

## 2013-09-21 DIAGNOSIS — D509 Iron deficiency anemia, unspecified: Secondary | ICD-10-CM | POA: Diagnosis not present

## 2013-09-21 DIAGNOSIS — G7001 Myasthenia gravis with (acute) exacerbation: Secondary | ICD-10-CM | POA: Diagnosis not present

## 2013-09-21 DIAGNOSIS — M069 Rheumatoid arthritis, unspecified: Secondary | ICD-10-CM | POA: Diagnosis not present

## 2013-09-21 NOTE — Telephone Encounter (Signed)
Patient advised of rx ready for pick up

## 2013-09-25 DIAGNOSIS — I83009 Varicose veins of unspecified lower extremity with ulcer of unspecified site: Secondary | ICD-10-CM | POA: Diagnosis not present

## 2013-09-25 DIAGNOSIS — M069 Rheumatoid arthritis, unspecified: Secondary | ICD-10-CM | POA: Diagnosis not present

## 2013-09-25 DIAGNOSIS — G7001 Myasthenia gravis with (acute) exacerbation: Secondary | ICD-10-CM | POA: Diagnosis not present

## 2013-09-25 DIAGNOSIS — I872 Venous insufficiency (chronic) (peripheral): Secondary | ICD-10-CM | POA: Diagnosis not present

## 2013-09-25 DIAGNOSIS — R269 Unspecified abnormalities of gait and mobility: Secondary | ICD-10-CM | POA: Diagnosis not present

## 2013-09-25 DIAGNOSIS — D509 Iron deficiency anemia, unspecified: Secondary | ICD-10-CM | POA: Diagnosis not present

## 2013-09-27 ENCOUNTER — Ambulatory Visit (INDEPENDENT_AMBULATORY_CARE_PROVIDER_SITE_OTHER): Payer: Medicare Other | Admitting: Internal Medicine

## 2013-09-27 VITALS — BP 135/79 | HR 124 | Temp 98.4°F | Resp 18 | Ht 67.0 in | Wt 190.0 lb

## 2013-09-27 DIAGNOSIS — N179 Acute kidney failure, unspecified: Secondary | ICD-10-CM | POA: Diagnosis not present

## 2013-09-27 DIAGNOSIS — I872 Venous insufficiency (chronic) (peripheral): Secondary | ICD-10-CM | POA: Diagnosis not present

## 2013-09-27 DIAGNOSIS — L97909 Non-pressure chronic ulcer of unspecified part of unspecified lower leg with unspecified severity: Secondary | ICD-10-CM | POA: Diagnosis not present

## 2013-09-27 DIAGNOSIS — E875 Hyperkalemia: Secondary | ICD-10-CM | POA: Diagnosis not present

## 2013-09-27 DIAGNOSIS — Z23 Encounter for immunization: Secondary | ICD-10-CM | POA: Diagnosis not present

## 2013-09-27 DIAGNOSIS — D509 Iron deficiency anemia, unspecified: Secondary | ICD-10-CM | POA: Diagnosis not present

## 2013-09-27 DIAGNOSIS — E872 Acidosis, unspecified: Secondary | ICD-10-CM | POA: Diagnosis not present

## 2013-09-27 DIAGNOSIS — I878 Other specified disorders of veins: Secondary | ICD-10-CM

## 2013-09-27 DIAGNOSIS — L97919 Non-pressure chronic ulcer of unspecified part of right lower leg with unspecified severity: Secondary | ICD-10-CM

## 2013-09-27 DIAGNOSIS — M79606 Pain in leg, unspecified: Secondary | ICD-10-CM

## 2013-09-27 DIAGNOSIS — M79609 Pain in unspecified limb: Secondary | ICD-10-CM

## 2013-09-27 DIAGNOSIS — L97929 Non-pressure chronic ulcer of unspecified part of left lower leg with unspecified severity: Principal | ICD-10-CM

## 2013-09-27 LAB — COMPREHENSIVE METABOLIC PANEL
ALBUMIN: 3.3 g/dL — AB (ref 3.5–5.2)
ALT: 9 U/L (ref 0–35)
AST: 14 U/L (ref 0–37)
Alkaline Phosphatase: 70 U/L (ref 39–117)
BUN: 17 mg/dL (ref 6–23)
CHLORIDE: 99 meq/L (ref 96–112)
CO2: 21 meq/L (ref 19–32)
Calcium: 8.9 mg/dL (ref 8.4–10.5)
Creat: 1.28 mg/dL — ABNORMAL HIGH (ref 0.50–1.10)
GLUCOSE: 91 mg/dL (ref 70–99)
POTASSIUM: 5.5 meq/L — AB (ref 3.5–5.3)
SODIUM: 128 meq/L — AB (ref 135–145)
TOTAL PROTEIN: 6.6 g/dL (ref 6.0–8.3)
Total Bilirubin: 0.3 mg/dL (ref 0.2–1.2)

## 2013-09-27 LAB — CBC WITH DIFFERENTIAL/PLATELET
BASOS ABS: 0 10*3/uL (ref 0.0–0.1)
Basophils Relative: 0 % (ref 0–1)
EOS ABS: 0.1 10*3/uL (ref 0.0–0.7)
Eosinophils Relative: 2 % (ref 0–5)
HCT: 31.2 % — ABNORMAL LOW (ref 36.0–46.0)
Hemoglobin: 9.8 g/dL — ABNORMAL LOW (ref 12.0–15.0)
LYMPHS PCT: 13 % (ref 12–46)
Lymphs Abs: 0.9 10*3/uL (ref 0.7–4.0)
MCH: 22 pg — ABNORMAL LOW (ref 26.0–34.0)
MCHC: 31.4 g/dL (ref 30.0–36.0)
MCV: 70.1 fL — ABNORMAL LOW (ref 78.0–100.0)
Monocytes Absolute: 0.9 10*3/uL (ref 0.1–1.0)
Monocytes Relative: 13 % — ABNORMAL HIGH (ref 3–12)
NEUTROS PCT: 72 % (ref 43–77)
Neutro Abs: 4.8 10*3/uL (ref 1.7–7.7)
PLATELETS: 528 10*3/uL — AB (ref 150–400)
RBC: 4.45 MIL/uL (ref 3.87–5.11)
RDW: 20.1 % — ABNORMAL HIGH (ref 11.5–15.5)
WBC: 6.6 10*3/uL (ref 4.0–10.5)

## 2013-09-27 MED ORDER — NYSTATIN 100000 UNIT/GM EX CREA: 1.0000 "application " | TOPICAL_CREAM | Freq: Two times a day (BID) | CUTANEOUS | Status: DC

## 2013-09-27 MED ORDER — OXYCODONE HCL 5 MG PO TABS: 5.0000 mg | ORAL_TABLET | Freq: Four times a day (QID) | ORAL | Status: DC | PRN

## 2013-10-02 DIAGNOSIS — D509 Iron deficiency anemia, unspecified: Secondary | ICD-10-CM | POA: Diagnosis not present

## 2013-10-02 DIAGNOSIS — I83009 Varicose veins of unspecified lower extremity with ulcer of unspecified site: Secondary | ICD-10-CM | POA: Diagnosis not present

## 2013-10-02 DIAGNOSIS — G7001 Myasthenia gravis with (acute) exacerbation: Secondary | ICD-10-CM | POA: Diagnosis not present

## 2013-10-02 DIAGNOSIS — M069 Rheumatoid arthritis, unspecified: Secondary | ICD-10-CM | POA: Diagnosis not present

## 2013-10-02 DIAGNOSIS — R269 Unspecified abnormalities of gait and mobility: Secondary | ICD-10-CM | POA: Diagnosis not present

## 2013-10-02 DIAGNOSIS — I872 Venous insufficiency (chronic) (peripheral): Secondary | ICD-10-CM | POA: Diagnosis not present

## 2013-10-04 ENCOUNTER — Ambulatory Visit: Payer: Self-pay | Admitting: Neurology

## 2013-10-05 ENCOUNTER — Ambulatory Visit (INDEPENDENT_AMBULATORY_CARE_PROVIDER_SITE_OTHER): Payer: Medicare Other | Admitting: Neurology

## 2013-10-05 ENCOUNTER — Encounter: Payer: Self-pay | Admitting: Neurology

## 2013-10-05 VITALS — BP 143/93 | HR 103 | Ht 65.0 in | Wt 188.0 lb

## 2013-10-05 DIAGNOSIS — I82409 Acute embolism and thrombosis of unspecified deep veins of unspecified lower extremity: Secondary | ICD-10-CM | POA: Diagnosis not present

## 2013-10-05 DIAGNOSIS — G7000 Myasthenia gravis without (acute) exacerbation: Secondary | ICD-10-CM | POA: Diagnosis not present

## 2013-10-05 DIAGNOSIS — I82403 Acute embolism and thrombosis of unspecified deep veins of lower extremity, bilateral: Secondary | ICD-10-CM

## 2013-10-05 MED ORDER — PYRIDOSTIGMINE BROMIDE 60 MG PO TABS: 60.0000 mg | ORAL_TABLET | Freq: Four times a day (QID) | ORAL | Status: DC

## 2013-10-05 NOTE — Progress Notes (Signed)
PATIENT: [name] DOB: [birth year]  HISTORICAL  Estelene Milks is a 53 years old right-handed African American female, came in to refill a prescription of Mestinon,  She has past medical history of bilateral lower extremity chronic infection, came in with a wheelchair, she also has a history of asthma, anemia.  She reported that she was diagnosed with myasthenia gravis since age 38, she has a tendency to close her eyelid, she denied diplopia, no bulbar, or limb muscle weakness.  She has been taking Mestinon 60 mg 4 times a day for many years,   The diagnosis of myasthenia gravis was confirmed by her previous laboratory evaluation with [name] in 2013, acetylcholine receptor binding antibody more than 80, blocking antibody was 45%, with normal TSH.                                 She is not a good candidate for any immunosuppressive treatment because of her chronic bilateral lower extremity infection, over the years, her symptoms has been maintained on Mestinon 60 mg 4 times a day, if she missed her medications, she noticed recurrent of bilateral ptosis.  REVIEW OF SYSTEMS: Full 14 system review of systems performed and notable only for trouble swallowing, shortness of breath, anemia, wound,  ALLERGIES: Allergies  Allergen Reactions  . Asa [Aspirin] Shortness Of Breath, Rash and Anaphylaxis  . Cat Hair Extract Anaphylaxis  . Dilaudid [Hydromorphone Hcl] Shortness Of Breath and Palpitations  . Tylenol [Acetaminophen] Rash, Other (See Comments) and Anaphylaxis    Other reaction(s): SHORTNESS OF BREATH Salivation and hearing loss  . Ciprofloxacin     Other reaction(s): OTHER  . Dicloxacillin     Other reaction(s): OTHER  . Erythromycin Other (See Comments)    Burn veins  . Hydromorphone     Other reaction(s): OTHER  . Indomethacin Other (See Comments)    Other reaction(s): OTHER Stroke symptoms  . Naproxen Other (See Comments)    Other reaction(s): OTHER Renal failure  .  Tramadol     "stroke"  . Alginate-Collagen [Wound Dressings] Rash  . Gold-Containing Drug Products Rash  . Silvadene [Silver Sulfadiazine] Rash    HOME MEDICATIONS: Current Outpatient Prescriptions on File Prior to Visit  Medication Sig Dispense Refill  . albuterol (PROVENTIL HFA;VENTOLIN HFA) 108 (90 BASE) MCG/ACT inhaler Inhale 4 puffs into the lungs every 6 (six) hours as needed. For shortness of breath      . COD LIVER OIL PO Take 2 capsules by mouth daily.      . diphenhydrAMINE (BENADRYL) 25 MG tablet Take 1 tablet (25 mg total) by mouth every 6 (six) hours as needed.  30 tablet  0  . Emollient (NIVEA) cream Apply 1 application topically daily. Apply all over body      . EPINEPHrine (EPIPEN) 0.3 mg/0.3 mL DEVI Inject 0.3 mg into the muscle as needed. For allergic reaction      . ferrous sulfate 325 (65 FE) MG tablet Take 325-650 mg by mouth daily with breakfast.      . folic acid (FOLVITE) 160 MCG tablet Take 400 mcg by mouth daily.      . Garlic Oil 7371 MG CAPS Take 1,000 mg by mouth daily.      Marland Kitchen lidocaine (XYLOCAINE) 4 % external solution Apply 5 mLs topically 4 (four) times daily.       . Multiple Vitamin (MULTIVITAMIN WITH MINERALS) TABS Take 1 tablet  by mouth daily. 1 gummy      . Multiple Vitamins-Minerals (ZINC PO) Take 1 tablet by mouth daily.      Marland Kitchen nystatin cream (MYCOSTATIN) Apply 1 application topically 2 (two) times daily.  30 g  0  . Omega-3 Fatty Acids (FISH OIL) 500 MG CAPS Take 1,000 mg by mouth daily.      Marland Kitchen oxyCODONE (OXY IR/ROXICODONE) 5 MG immediate release tablet Take 1 tablet (5 mg total) by mouth every 6 (six) hours as needed.  60 tablet  0  . pyridostigmine (MESTINON) 60 MG tablet Take 60 mg by mouth 4 (four) times daily.       No current facility-administered medications on file prior to visit.    PAST MEDICAL HISTORY: Past Medical History  Diagnosis Date  . Asthma   . Hypertension   . CHF (congestive heart failure)   . Myasthenia gravis   . Heart  murmur   . Venous stasis   . DVT (deep venous thrombosis) 2000, 2004    Not on anticoagulation     PAST SURGICAL HISTORY: Past Surgical History  Procedure Laterality Date  . Tonsillectomy    . Myomectomy    . Failed bilateral leg grafts      FAMILY HISTORY: Family History  Problem Relation Age of Onset  . Depression    . Cancer      SOCIAL HISTORY:  History   Social History  . Marital Status: Single    Spouse Name: N/A    Number of Children: N/A  . Years of Education: college   Occupational History  .      Disabled   Social History Main Topics  . Smoking status: Never Smoker   . Smokeless tobacco: Never Used  . Alcohol Use: No  . Drug Use: No  . Sexual Activity: No   Other Topics Concern  . Not on file   Social History Narrative   Patient lives at home alone.   Disabled.   Education college.   Right handed.   Caffeine patient drinks coffee.     PHYSICAL EXAM   Filed Vitals:   10/05/13 0843  BP: 143/93  Pulse: 103  Height: 5\' 5"  (1.651 m)  Weight: 188 lb (85.276 kg)    Not recorded    Body mass index is 31.28 kg/(m^2).   Generalized: In no acute distress  Neck: Supple, no carotid bruits   Cardiac: Regular rate rhythm  Pulmonary: Clear to auscultation bilaterally  Musculoskeletal: No deformity  Neurological examination  Mentation: Alert oriented to time, place, history taking, and causual conversation  Cranial nerve II-XII: Pupils were equal round reactive to light. Extraocular movements were full.  Visual field were full on confrontational test.  Facial sensation and strength were normal. Hearing was intact to finger rubbing bilaterally. Uvula tongue midline.  Head turning and shoulder shrug and were normal and symmetric.Tongue protrusion into cheek strength was normal.  Motor: Normal tone, bulk and strength, bilateral lower extremity is in gauze wrap  Sensory: Intact to fine touch, pinprick, preserved vibratory sensation, and  proprioception at toes.  Coordination: Normal finger to nose, heel-to-shin bilaterally there was no truncal ataxia  Gait: Rising up from seated position by pushing on her wheelchair  Deep tendon reflexes: Brachioradialis 2/2, biceps 2/2, triceps 2/2, patellar 2/2, Achilles 2/2, plantar responses were flexor bilaterally.   DIAGNOSTIC DATA (LABS, IMAGING, TESTING) - I reviewed patient records, labs, notes, testing and imaging myself where available.  Lab Results  Component  Value Date   WBC 6.6 09/27/2013   HGB 9.8* 09/27/2013   HCT 31.2* 09/27/2013   MCV 70.1* 09/27/2013   PLT 528* 09/27/2013      Component Value Date/Time   NA 128* 09/27/2013 1517   K 5.5* 09/27/2013 1517   CL 99 09/27/2013 1517   CO2 21 09/27/2013 1517   GLUCOSE 91 09/27/2013 1517   BUN 17 09/27/2013 1517   CREATININE 1.28* 09/27/2013 1517   CREATININE 1.05 09/09/2013 0421   CALCIUM 8.9 09/27/2013 1517   CALCIUM 9.1 08/26/2010 1106   PROT 6.6 09/27/2013 1517   ALBUMIN 3.3* 09/27/2013 1517   AST 14 09/27/2013 1517   ALT 9 09/27/2013 1517   ALKPHOS 70 09/27/2013 1517   BILITOT 0.3 09/27/2013 1517   GFRNONAA 59* 09/09/2013 0421   GFRAA 68* 09/09/2013 0421   Lab Results  Component Value Date   CHOL  Value: 75        ATP III CLASSIFICATION:  <200     mg/dL   Desirable  200-239  mg/dL   Borderline High  >=240    mg/dL   High        08/03/2008   HDL 38* 08/03/2008   LDLCALC  Value: 15        Total Cholesterol/HDL:CHD Risk Coronary Heart Disease Risk Table                     Men   Women  1/2 Average Risk   3.4   3.3  Average Risk       5.0   4.4  2 X Average Risk   9.6   7.1  3 X Average Risk  23.4   11.0        Use the calculated Patient Ratio above and the CHD Risk Table to determine the patient's CHD Risk.        ATP III CLASSIFICATION (LDL):  <100     mg/dL   Optimal  100-129  mg/dL   Near or Above                    Optimal  130-159  mg/dL   Borderline  160-189  mg/dL   High  >190     mg/dL   Very High 08/03/2008   TRIG 110 08/03/2008   CHOLHDL 2.0  08/03/2008   No results found for this basename: HGBA1C   Lab Results  Component Value Date   VITAMINB12 1264* 08/03/2008   Lab Results  Component Value Date   TSH 0.216* 09/05/2013      ASSESSMENT AND PLAN  Bianca Bourquin is a 53 y.o. female with serum positive myasthenia gravis, is not a good candidate for immunosuppressive treatment, because of chronic bilateral lower extremity infection, her symptoms of bilateral ptosis is well controlled on Mestinon 60 mg one tablet 4 times a day, she denies double vision, no dysarthria or dysphagia, no arm and leg muscle weakness.  Refill her Mestinon 60 mg one tablet 4 times a day She may continue refill with her primary care physician.   Marcial Pacas, M.D. Ph.D.  [facility] [address], [place], [place] [address] [phone]

## 2013-10-09 DIAGNOSIS — I872 Venous insufficiency (chronic) (peripheral): Secondary | ICD-10-CM | POA: Diagnosis not present

## 2013-10-09 DIAGNOSIS — I83009 Varicose veins of unspecified lower extremity with ulcer of unspecified site: Secondary | ICD-10-CM | POA: Diagnosis not present

## 2013-10-09 DIAGNOSIS — M069 Rheumatoid arthritis, unspecified: Secondary | ICD-10-CM | POA: Diagnosis not present

## 2013-10-09 DIAGNOSIS — D509 Iron deficiency anemia, unspecified: Secondary | ICD-10-CM | POA: Diagnosis not present

## 2013-10-09 DIAGNOSIS — R269 Unspecified abnormalities of gait and mobility: Secondary | ICD-10-CM | POA: Diagnosis not present

## 2013-10-09 DIAGNOSIS — G7001 Myasthenia gravis with (acute) exacerbation: Secondary | ICD-10-CM | POA: Diagnosis not present

## 2013-10-14 ENCOUNTER — Other Ambulatory Visit: Payer: Self-pay | Admitting: Internal Medicine

## 2013-10-15 ENCOUNTER — Other Ambulatory Visit: Payer: Self-pay

## 2013-10-15 ENCOUNTER — Telehealth: Payer: Self-pay | Admitting: Internal Medicine

## 2013-10-15 DIAGNOSIS — L97929 Non-pressure chronic ulcer of unspecified part of left lower leg with unspecified severity: Principal | ICD-10-CM

## 2013-10-15 DIAGNOSIS — M79606 Pain in leg, unspecified: Secondary | ICD-10-CM

## 2013-10-15 DIAGNOSIS — L97919 Non-pressure chronic ulcer of unspecified part of right lower leg with unspecified severity: Secondary | ICD-10-CM

## 2013-10-15 MED ORDER — LIDOCAINE HCL 4 % EX SOLN: 5.0000 mL | Freq: Four times a day (QID) | CUTANEOUS | Status: AC

## 2013-10-15 MED ORDER — OXYCODONE HCL 5 MG PO TABS: 5.0000 mg | ORAL_TABLET | Freq: Four times a day (QID) | ORAL | Status: DC | PRN

## 2013-10-15 NOTE — Telephone Encounter (Signed)
Prescription filled for Oxycodone 5 mg # 60 tabs

## 2013-10-15 NOTE — Telephone Encounter (Signed)
Refill request for oxycodone 5mg  LOV 08/31/2013. Please advise. Thanks!

## 2013-10-18 DIAGNOSIS — M069 Rheumatoid arthritis, unspecified: Secondary | ICD-10-CM | POA: Diagnosis not present

## 2013-10-18 DIAGNOSIS — G7001 Myasthenia gravis with (acute) exacerbation: Secondary | ICD-10-CM | POA: Diagnosis not present

## 2013-10-18 DIAGNOSIS — I872 Venous insufficiency (chronic) (peripheral): Secondary | ICD-10-CM | POA: Diagnosis not present

## 2013-10-18 DIAGNOSIS — R269 Unspecified abnormalities of gait and mobility: Secondary | ICD-10-CM | POA: Diagnosis not present

## 2013-10-18 DIAGNOSIS — I83009 Varicose veins of unspecified lower extremity with ulcer of unspecified site: Secondary | ICD-10-CM | POA: Diagnosis not present

## 2013-10-18 DIAGNOSIS — D509 Iron deficiency anemia, unspecified: Secondary | ICD-10-CM | POA: Diagnosis not present

## 2013-10-18 DIAGNOSIS — L97909 Non-pressure chronic ulcer of unspecified part of unspecified lower leg with unspecified severity: Secondary | ICD-10-CM | POA: Diagnosis not present

## 2013-10-24 ENCOUNTER — Encounter: Payer: Self-pay | Admitting: Internal Medicine

## 2013-10-29 ENCOUNTER — Telehealth: Payer: Self-pay | Admitting: Internal Medicine

## 2013-10-29 ENCOUNTER — Ambulatory Visit (INDEPENDENT_AMBULATORY_CARE_PROVIDER_SITE_OTHER): Payer: Medicare Other | Admitting: Family Medicine

## 2013-10-29 ENCOUNTER — Ambulatory Visit: Payer: Medicare Other | Admitting: Internal Medicine

## 2013-10-29 VITALS — BP 130/81 | HR 116 | Temp 98.3°F | Resp 18 | Ht 67.0 in | Wt 183.0 lb

## 2013-10-29 DIAGNOSIS — L97919 Non-pressure chronic ulcer of unspecified part of right lower leg with unspecified severity: Secondary | ICD-10-CM | POA: Diagnosis not present

## 2013-10-29 DIAGNOSIS — M79606 Pain in leg, unspecified: Secondary | ICD-10-CM | POA: Diagnosis not present

## 2013-10-29 DIAGNOSIS — D509 Iron deficiency anemia, unspecified: Secondary | ICD-10-CM | POA: Diagnosis not present

## 2013-10-29 DIAGNOSIS — L97929 Non-pressure chronic ulcer of unspecified part of left lower leg with unspecified severity: Secondary | ICD-10-CM | POA: Diagnosis not present

## 2013-10-29 DIAGNOSIS — I878 Other specified disorders of veins: Secondary | ICD-10-CM

## 2013-10-29 DIAGNOSIS — I10 Essential (primary) hypertension: Secondary | ICD-10-CM

## 2013-10-29 DIAGNOSIS — M069 Rheumatoid arthritis, unspecified: Secondary | ICD-10-CM

## 2013-10-29 DIAGNOSIS — G7000 Myasthenia gravis without (acute) exacerbation: Secondary | ICD-10-CM

## 2013-10-29 MED ORDER — OXYCODONE HCL 5 MG PO TABS: 5.0000 mg | ORAL_TABLET | ORAL | Status: DC | PRN

## 2013-10-29 NOTE — Telephone Encounter (Signed)
Left voicemail for patient cancelling appointment for 10/29/13.

## 2013-10-29 NOTE — Progress Notes (Signed)
Subjective:    Patient ID: [name], female    DOB: April 16, 1960, 53 y.o.   MRN: [id number]  HPI Patient presents for a follow up of bilateral leg ulcers. She states that venous stasis ulcers began in 2006. She maintains that [facility]. She states that they are not longer delivering supplies to her home. She purchased supplies from [employer] due to constant drainage of ulcers. She changes dressings 4-5 times per day. She states that dressing changes are extremely painful,pain intensity often increases to 10/10 during dressing changes.  She reports that she has been having to increase pain medications due to changes. She is currently out of pain medications. Current pain intensity is 8/10 described as throbbing, "biting", and constant.    [name] is also here for a follow up of hypertension.. She is not exercising and is not adherent to low salt diet.  Blood pressure is well controlled at home. . Patient denies chest pain, chest pressure/discomfort, dyspnea, fatigue, irregular heart beat, orthopnea, palpitations and syncope.  Cardiovascular risk factors: hypertension, obesity (BMI >= 30 kg/m2) and sedentary lifestyle.Marland Kitchen History of target organ damage includes heart failure.     Past Medical History  Diagnosis Date  . Asthma   . Hypertension   . CHF (congestive heart failure)   . Myasthenia gravis   . Heart murmur   . Venous stasis   . DVT (deep venous thrombosis) 2000, 2004    Not on anticoagulation    Review of Systems  Constitutional: Negative for fatigue.  Eyes: Negative.   Respiratory: Negative.   Cardiovascular: Positive for leg swelling.  Endocrine: Negative.   Genitourinary: Negative.   Skin: Positive for wound (bilateral leg ulcers).  Allergic/Immunologic: Negative.   Neurological: Negative.   Psychiatric/Behavioral: Negative.        Objective:   Physical Exam  Constitutional: She appears well-developed  and well-nourished.  HENT:  Head: Normocephalic and atraumatic.  Right Ear: External ear normal.  Mouth/Throat: Oropharynx is clear and moist.  Eyes: Pupils are equal, round, and reactive to light.  Neck: Normal range of motion. Neck supple.  Cardiovascular: Normal rate, regular rhythm, normal heart sounds and intact distal pulses.   Musculoskeletal: Normal range of motion.  Neurological: She is alert.  Skin: Skin is warm and dry. She is not diaphoretic. No pallor.  Pt has extensive non-staegable wounds on both legs. The wounds cover most of the anterior and lateral surfaces of the legs. There are some areas of dehydrated necrotic tissue and slough interspersed with granulated tissue in the wound bed. Moderate, serous drainage to dressing. Dressings include 4X4s and ABD pads.   Psychiatric: She has a normal mood and affect. Her behavior is normal. Judgment and thought content normal.           BP 130/81  Pulse 116  Temp(Src) 98.3 F (36.8 C) (Oral)  Resp 18  Ht 5\' 7"  (1.702 m)  Wt 183 lb (83.008 kg)  BMI 28.66 kg/m2  LMP 07/12/2011 Assessment & Plan:  1. Ulcers of both lower legs Patient has painful bilateral, draining leg ulcers that require frequent dressing changes. Patient has recently purchased supplies to change dressings frequently.  She states that she will go to wound care at [facility] on November 08, 2013. Patient states that she has "throbbing, biting pain", pain is constant. States that bilateral lower extremities are constantly draining.  - oxyCODONE (OXY IR/ROXICODONE) 5 MG immediate release tablet; Take  1 tablet (5 mg total) by mouth every 4 (four) hours as needed for severe pain.  Dispense: 90 tablet; Refill: 0  2. Essential hypertension Stable on current medication regimen.  -BMP; Future order  3. Pain of lower extremity, unspecified laterality  - oxyCODONE (OXY IR/ROXICODONE) 5 MG immediate release tablet; Take 1 tablet (5 mg total) by mouth every 4 (four)  hours as needed for severe pain.  Dispense: 90 tablet; Refill: 0  4. Anemia, iron deficiency Patient has been on iron therapy consistently, will check CBC -CBC; future order 5. Myasthenia gravis Ms. Scherman was evaluated by Dr. Krista Blue, [facility] on 10/05/2013. She is currently not a good candidate for immunosupressive therapy due to bilateral lower extremity non healing ulcers. Symptoms are currently controlled on Mestinon four times daily.   6. Rheumatoid arthritis Stable  7. Venous stasis Appt at [facility] on 11/08/2013.     RTC: 1 month for re-check of ulcers and hypertension Hollis,Lachina M, FNP

## 2013-10-29 NOTE — Progress Notes (Signed)
Patient ID: [name], female   DOB: [birth year], 53 y.o.   MRN: [id number]   [name], is a 53 y.o. female  VVO:[id number]  YIR:[id number]  DOB - [birth year]  CC:  Chief Complaint  Patient presents with  . Follow-up       HPI: [name] is a 53 y.o. female here today to follow up post hospitalization where she was diagnosed with AKI (resolved), electrolyte disturbance and septic shock (resolved). Pt reports that shwe has been doing pretty well since discharge from the hospital. Today she has no c/o except that she has no wound dressing for her legs and continues to use trashbags and sanitary napkins as wound dressings.  Patient has No headache, No chest pain, No abdominal pain - No Nausea, No new weakness tingling or numbness, No Cough - SOB.  Allergies  Allergen Reactions  . Asa [Aspirin] Shortness Of Breath, Rash and Anaphylaxis  . Cat Hair Extract Anaphylaxis  . Dilaudid [Hydromorphone Hcl] Shortness Of Breath and Palpitations  . Tylenol [Acetaminophen] Rash, Other (See Comments) and Anaphylaxis    Other reaction(s): SHORTNESS OF BREATH Salivation and hearing loss  . Ciprofloxacin     Other reaction(s): OTHER  . Dicloxacillin     Other reaction(s): OTHER  . Erythromycin Other (See Comments)    Burn veins  . Hydromorphone     Other reaction(s): OTHER  . Indomethacin Other (See Comments)    Other reaction(s): OTHER Stroke symptoms  . Naproxen Other (See Comments)    Other reaction(s): OTHER Renal failure  . Tramadol     "stroke"  . Alginate-Collagen [Wound Dressings] Rash  . Gold-Containing Drug Products Rash  . Silvadene [Silver Sulfadiazine] Rash   Past Medical History  Diagnosis Date  . Asthma   . Hypertension   . CHF (congestive heart failure)   . Myasthenia gravis   . Heart murmur   . Venous stasis   . DVT (deep venous thrombosis) 2000, 2004    Not on anticoagulation    Current Outpatient Prescriptions on File Prior to Visit  Medication  Sig Dispense Refill  . albuterol (PROVENTIL HFA;VENTOLIN HFA) 108 (90 BASE) MCG/ACT inhaler Inhale 4 puffs into the lungs every 6 (six) hours as needed. For shortness of breath      . COD LIVER OIL PO Take 2 capsules by mouth daily.      . diphenhydrAMINE (BENADRYL) 25 MG tablet Take 1 tablet (25 mg total) by mouth every 6 (six) hours as needed.  30 tablet  0  . Emollient (NIVEA) cream Apply 1 application topically daily. Apply all over body      . EPINEPHrine (EPIPEN) 0.3 mg/0.3 mL DEVI Inject 0.3 mg into the muscle as needed. For allergic reaction      . ferrous sulfate 325 (65 FE) MG tablet Take 325-650 mg by mouth daily with breakfast.      . folic acid (FOLVITE) 500 MCG tablet Take 400 mcg by mouth daily.      . Garlic Oil 9381 MG CAPS Take 1,000 mg by mouth daily.      . Multiple Vitamin (MULTIVITAMIN WITH MINERALS) TABS Take 1 tablet by mouth daily. 1 gummy      . Multiple Vitamins-Minerals (ZINC PO) Take 1 tablet by mouth daily.      . Omega-3 Fatty Acids (FISH OIL) 500 MG CAPS Take 1,000 mg by mouth daily.       No current facility-administered medications on file prior to visit.   Family History  Problem Relation Age of Onset  . Depression    . Cancer     History   Social History  . Marital Status: Single    Spouse Name: N/A    Number of Children: N/A  . Years of Education: college   Occupational History  .      Disabled   Social History Main Topics  . Smoking status: Never Smoker   . Smokeless tobacco: Never Used  . Alcohol Use: No  . Drug Use: No  . Sexual Activity: No   Other Topics Concern  . Not on file   Social History Narrative   Patient lives at home alone.   Disabled.   Education college.   Right handed.   Caffeine patient drinks coffee.    Review of Systems: Constitutional: Negative for fever, chills, diaphoresis, activity change, appetite change and fatigue. HENT: Negative for ear pain, nosebleeds, congestion, facial swelling, rhinorrhea, neck  pain, neck stiffness and ear discharge.  Eyes: Negative for pain, discharge, redness, itching and visual disturbance. Respiratory: Negative for cough, choking, chest tightness, shortness of breath, wheezing and stridor.  Cardiovascular: Negative for chest pain, palpitations and leg swelling. Gastrointestinal: Negative for abdominal distention. Genitourinary: Negative for dysuria, urgency, frequency, hematuria, flank pain, decreased urine volume, difficulty urinating and dyspareunia.  Musculoskeletal: Negative for back pain, joint swelling, arthralgia and gait problem. Neurological: Negative for dizziness, tremors, seizures, syncope, facial asymmetry, speech difficulty, weakness, light-headedness, numbness and headaches.  Hematological: Negative for adenopathy. Does not bruise/bleed easily. Psychiatric/Behavioral: Negative for hallucinations, behavioral problems, confusion, dysphoric mood, decreased concentration and agitation.    Objective:         Filed Vitals:   09/27/13 1307  BP: 135/79  Pulse: 124  Temp: 98.4 F (36.9 C)  Resp: 18    Physical Exam: Constitutional: Patient appears well-developed and well-nourished. No distress. HENT: Normocephalic, atraumatic, External right and left ear normal. Oropharynx is clear and moist.  Eyes: Conjunctivae and EOM are normal. PERRLA, no scleral icterus. Neck: Normal ROM. Neck supple. No JVD. No tracheal deviation. No thyromegaly. CVS: RRR, S1/S2 +, no murmurs, no gallops, no carotid bruit.  Pulmonary: Effort and breath sounds normal, no stridor, rhonchi, wheezes, rales.  Abdominal: Soft. BS +, no distension, tenderness, rebound or guarding.  Musculoskeletal: Normal range of motion. No edema and no tenderness.  Lymphadenopathy: No lymphadenopathy noted, cervical, inguinal or axillary Neuro: Alert. Normal reflexes, muscle tone coordination. No cranial nerve deficit. Skin: Leg wounds are well granulated and have no associated foul smell.  No visible area of dehydrated necrotic tissue present. Psychiatric: Normal mood and affect. Behavior, judgment, thought content normal.  Lab Results  Component Value Date   WBC 6.6 09/27/2013   HGB 9.8* 09/27/2013   HCT 31.2* 09/27/2013   MCV 70.1* 09/27/2013   PLT 528* 09/27/2013   Lab Results  Component Value Date   CREATININE 1.28* 09/27/2013   BUN 17 09/27/2013   NA 128* 09/27/2013   K 5.5* 09/27/2013   CL 99 09/27/2013   CO2 21 09/27/2013    No results found for this basename: HGBA1C   Lipid Panel     Component Value Date/Time   CHOL  Value: 75        ATP III CLASSIFICATION:  <200     mg/dL   Desirable  200-239  mg/dL   Borderline High  >=240    mg/dL   High        08/03/2008 1105   TRIG 110 08/03/2008 1105  HDL 38* 08/03/2008 1105   CHOLHDL 2.0 08/03/2008 1105   VLDL 22 08/03/2008 1105   LDLCALC  Value: 15        Total Cholesterol/HDL:CHD Risk Coronary Heart Disease Risk Table                     Men   Women  1/2 Average Risk   3.4   3.3  Average Risk       5.0   4.4  2 X Average Risk   9.6   7.1  3 X Average Risk  23.4   11.0        Use the calculated Patient Ratio above and the CHD Risk Table to determine the patient's CHD Risk.        ATP III CLASSIFICATION (LDL):  <100     mg/dL   Optimal  100-129  mg/dL   Near or Above                    Optimal  130-159  mg/dL   Borderline  160-189  mg/dL   High  >190     mg/dL   Very High 08/03/2008 1105       Assessment and plan:   1. Ulcers of both lower legs - wound redressed with dry dressing. - AMB referral to wound care center - nystatin cream (MYCOSTATIN); Apply 1 application topically 2 (two) times daily.  Dispense: 30 g; Refill: 0  2. Venous stasis - AMB referral to wound care center  3. AKI (acute kidney injury) - Comprehensive metabolic panel  4. Hyperkalemia - Comprehensive metabolic panel  5. Hypocalcemia - Comprehensive metabolic panel  6. Metabolic acidosis - Comprehensive metabolic panel  7. Anemia, iron deficiency -  Continue Iron supoplements - CBC with Differential  8. Need for Tdap vaccination - Tdap vaccine greater than or equal to 7yo IM  9. Immunization due - Flu Vaccine QUAD 36+ mos PF IM (Fluarix Quad PF)   Return in about 4 weeks (around 10/25/2013) for HTN, Anemia.  The patient was given clear instructions to go to ER or return to medical center if symptoms don't improve, worsen or new problems develop. The patient verbalized understanding. The patient was told to call to get lab results if they haven't heard anything in the next week.     This note has been created with Surveyor, quantity. Any transcriptional errors are unintentional.    MATTHEWS,MICHELLE A., MD [place], [place]   10/29/2013, 12:50 PM

## 2013-10-29 NOTE — Patient Instructions (Signed)
Stasis Ulcer Stasis ulcers occur in the legs when the circulation is damaged. An ulcer may look like a small hole in the skin.  CAUSES Stasis ulcers occur because your veins do not work properly. Veins have valves that help the blood return to the heart. If these valves do not work right, blood flows backwards and backs up into the veins near the skin. This condition causes the veins to become larger because of increased pressure and may lead to a stasis ulcer. SYMPTOMS   Shallow (superficial) sore on the leg.  Clear drainage or weeping from the sore.  Leg pain or a feeling of heaviness. This may be worse at the end of the day.  Leg swelling.  Skin color changes. DIAGNOSIS  Your caregiver will make a diagnosis by examining your leg. Your caregiver may order tests such as an ultrasound or other studies to evaluate the blood flow of the leg. HOME CARE INSTRUCTIONS   Do not stand or sit in one position for long periods of time. Do not sit with your legs crossed. Rest with your legs raised during the day. If possible, it is best if you can elevate your legs above your heart for 30 minutes, 3 to 4 times a day.  Wear elastic stockings or support hose. Do not wear other tight encircling garments around legs, pelvis, or waist. This causes increased pressure in your veins. If your caregiver has applied compressive medicated wraps, use them as instructed.  Walk as much as possible to increase blood flow. If you are taking long rides in a car or plane, take a break to walk around every 2 hours. If not already on aspirin, take a baby aspirin before long trips unless you have medical reasons that prohibit this.  Raise the foot of your bed at night with 2-inch blocks if approved by your caregiver. This may not be desirable if you have heart failure or breathing problems.  If you get a cut in the skin over the vein and the vein bleeds, lie down with your leg raised and gently clean the area with a clean  cloth. Apply pressure on the cut until the bleeding stops. Then place a dressing on the cut. See your caregiver if it continues to bleed or needs stitches. Also, see your caregiver if you develop an infection.Signs of an infection include a fever, redness, increased pain, and drainage of pus.  If your caregiver has given you a follow-up appointment, it is very important to keep that appointment. Not keeping the appointment could result in a chronic or permanent injury, pain, and disability. If there is any problem keeping the appointment, call your caregiver for assistance. SEEK IMMEDIATE MEDICAL CARE IF:  The ulcer area starts to break down.  You have pain, redness, tenderness, pus, or hard swelling in your leg over a vein or near the ulcer.  Your leg pain is uncomfortable.  You develop an unexplained fever.  You develop chest pain or shortness of breath. Document Released: 10/06/2000 Document Revised: 04/05/2011 Document Reviewed: 05/03/2010 [facility] Patient Information 2015 [place], [place]. This information is not intended to replace advice given to you by your health care provider. Make sure you discuss any questions you have with your health care provider. Hypertension Hypertension, commonly called high blood pressure, is when the force of blood pumping through your arteries is too strong. Your arteries are the blood vessels that carry blood from your heart throughout your body. A blood pressure reading consists of a  higher number over a lower number, such as 110/72. The higher number (systolic) is the pressure inside your arteries when your heart pumps. The lower number (diastolic) is the pressure inside your arteries when your heart relaxes. Ideally you want your blood pressure below 120/80. Hypertension forces your heart to work harder to pump blood. Your arteries may become narrow or stiff. Having hypertension puts you at risk for heart disease, stroke, and other problems.  RISK  FACTORS Some risk factors for high blood pressure are controllable. Others are not.  Risk factors you cannot control include:   Race. You may be at higher risk if you are African American.  Age. Risk increases with age.  Gender. Men are at higher risk than women before age 64 years. After age 28, women are at higher risk than men. Risk factors you can control include:  Not getting enough exercise or physical activity.  Being overweight.  Getting too much fat, sugar, calories, or salt in your diet.  Drinking too much alcohol. SIGNS AND SYMPTOMS Hypertension does not usually cause signs or symptoms. Extremely high blood pressure (hypertensive crisis) may cause headache, anxiety, shortness of breath, and nosebleed. DIAGNOSIS  To check if you have hypertension, your health care provider will measure your blood pressure while you are seated, with your arm held at the level of your heart. It should be measured at least twice using the same arm. Certain conditions can cause a difference in blood pressure between your right and left arms. A blood pressure reading that is higher than normal on one occasion does not mean that you need treatment. If one blood pressure reading is high, ask your health care provider about having it checked again. TREATMENT  Treating high blood pressure includes making lifestyle changes and possibly taking medicine. Living a healthy lifestyle can help lower high blood pressure. You may need to change some of your habits. Lifestyle changes may include:  Following the DASH diet. This diet is high in fruits, vegetables, and whole grains. It is low in salt, red meat, and added sugars.  Getting at least 2 hours of brisk physical activity every week.  Losing weight if necessary.  Not smoking.  Limiting alcoholic beverages.  Learning ways to reduce stress. If lifestyle changes are not enough to get your blood pressure under control, your health care provider may  prescribe medicine. You may need to take more than one. Work closely with your health care provider to understand the risks and benefits. HOME CARE INSTRUCTIONS  Have your blood pressure rechecked as directed by your health care provider.   Take medicines only as directed by your health care provider. Follow the directions carefully. Blood pressure medicines must be taken as prescribed. The medicine does not work as well when you skip doses. Skipping doses also puts you at risk for problems.   Do not smoke.   Monitor your blood pressure at home as directed by your health care provider. SEEK MEDICAL CARE IF:   You think you are having a reaction to medicines taken.  You have recurrent headaches or feel dizzy.  You have swelling in your ankles.  You have trouble with your vision. SEEK IMMEDIATE MEDICAL CARE IF:  You develop a severe headache or confusion.  You have unusual weakness, numbness, or feel faint.  You have severe chest or abdominal pain.  You vomit repeatedly.  You have trouble breathing. MAKE SURE YOU:   Understand these instructions.  Will watch your condition.  Will  get help right away if you are not doing well or get worse. Document Released: 01/11/2005 Document Revised: 05/28/2013 Document Reviewed: 11/03/2012 [facility] Patient Information 2015 [place], [place]. This information is not intended to replace advice given to you by your health care provider. Make sure you discuss any questions you have with your health care provider.

## 2013-11-02 ENCOUNTER — Encounter: Payer: Self-pay | Admitting: Family Medicine

## 2013-11-14 ENCOUNTER — Telehealth: Payer: Self-pay | Admitting: Family Medicine

## 2013-11-14 DIAGNOSIS — L97919 Non-pressure chronic ulcer of unspecified part of right lower leg with unspecified severity: Secondary | ICD-10-CM

## 2013-11-14 DIAGNOSIS — M79606 Pain in leg, unspecified: Secondary | ICD-10-CM

## 2013-11-14 DIAGNOSIS — L97929 Non-pressure chronic ulcer of unspecified part of left lower leg with unspecified severity: Principal | ICD-10-CM

## 2013-11-14 NOTE — Telephone Encounter (Signed)
Refill request for oxyCODONE (OXY IR/ROXICODONE) 5 MG immediate release tablet. LOV 10/29/2013. Please advise. Thanks!

## 2013-11-14 NOTE — Telephone Encounter (Signed)
Patient is requesting a refill on oxycodone 5 mg.

## 2013-11-16 MED ORDER — OXYCODONE HCL 5 MG PO TABS: 5.0000 mg | ORAL_TABLET | ORAL | Status: DC | PRN

## 2013-11-16 NOTE — Telephone Encounter (Signed)
Meds ordered this encounter  Medications  . oxyCODONE (OXY IR/ROXICODONE) 5 MG immediate release tablet    Sig: Take 1 tablet (5 mg total) by mouth every 4 (four) hours as needed for severe pain.    Dispense:  90 tablet    Refill:  0    Order Specific Question:  Supervising Provider    Answer:  Liston Alba A [[id number]]  Cherron Blitzer M, FNP Reviewed [place] Substance Reporting system prior to reorder

## 2013-11-16 NOTE — Addendum Note (Signed)
Addended by: Dorena Dew on: 11/16/2013 11:05 AM   Modules accepted: Orders

## 2013-11-22 ENCOUNTER — Other Ambulatory Visit: Payer: Medicare Other

## 2013-11-23 DIAGNOSIS — I872 Venous insufficiency (chronic) (peripheral): Secondary | ICD-10-CM | POA: Diagnosis not present

## 2013-11-23 DIAGNOSIS — B369 Superficial mycosis, unspecified: Secondary | ICD-10-CM | POA: Diagnosis not present

## 2013-11-23 DIAGNOSIS — I890 Lymphedema, not elsewhere classified: Secondary | ICD-10-CM | POA: Diagnosis not present

## 2013-11-23 DIAGNOSIS — I87012 Postthrombotic syndrome with ulcer of left lower extremity: Secondary | ICD-10-CM | POA: Diagnosis not present

## 2013-11-23 DIAGNOSIS — I87011 Postthrombotic syndrome with ulcer of right lower extremity: Secondary | ICD-10-CM | POA: Diagnosis not present

## 2013-11-27 ENCOUNTER — Other Ambulatory Visit: Payer: Medicare Other

## 2013-11-27 DIAGNOSIS — D509 Iron deficiency anemia, unspecified: Secondary | ICD-10-CM | POA: Diagnosis not present

## 2013-11-27 DIAGNOSIS — I10 Essential (primary) hypertension: Secondary | ICD-10-CM | POA: Diagnosis not present

## 2013-11-27 LAB — CBC WITH DIFFERENTIAL/PLATELET
Basophils Absolute: 0 10*3/uL (ref 0.0–0.1)
Basophils Relative: 0 % (ref 0–1)
Eosinophils Absolute: 0.1 10*3/uL (ref 0.0–0.7)
Eosinophils Relative: 3 % (ref 0–5)
HEMATOCRIT: 27.1 % — AB (ref 36.0–46.0)
HEMOGLOBIN: 8.4 g/dL — AB (ref 12.0–15.0)
LYMPHS PCT: 12 % (ref 12–46)
Lymphs Abs: 0.6 10*3/uL — ABNORMAL LOW (ref 0.7–4.0)
MCH: 20.1 pg — ABNORMAL LOW (ref 26.0–34.0)
MCHC: 31 g/dL (ref 30.0–36.0)
MCV: 65 fL — ABNORMAL LOW (ref 78.0–100.0)
MONO ABS: 0.7 10*3/uL (ref 0.1–1.0)
Monocytes Relative: 14 % — ABNORMAL HIGH (ref 3–12)
NEUTROS ABS: 3.3 10*3/uL (ref 1.7–7.7)
NEUTROS PCT: 71 % (ref 43–77)
Platelets: 313 10*3/uL (ref 150–400)
RBC: 4.17 MIL/uL (ref 3.87–5.11)
RDW: 20.3 % — AB (ref 11.5–15.5)
WBC: 4.7 10*3/uL (ref 4.0–10.5)

## 2013-11-27 LAB — BASIC METABOLIC PANEL
BUN: 13 mg/dL (ref 6–23)
CHLORIDE: 104 meq/L (ref 96–112)
CO2: 21 mEq/L (ref 19–32)
Calcium: 8.8 mg/dL (ref 8.4–10.5)
Creat: 0.85 mg/dL (ref 0.50–1.10)
Glucose, Bld: 84 mg/dL (ref 70–99)
Potassium: 4.3 mEq/L (ref 3.5–5.3)
Sodium: 135 mEq/L (ref 135–145)

## 2013-11-29 ENCOUNTER — Ambulatory Visit: Payer: Medicare Other | Admitting: Internal Medicine

## 2013-12-06 ENCOUNTER — Ambulatory Visit: Payer: Medicare Other | Admitting: Internal Medicine

## 2013-12-06 VITALS — BP 168/84 | HR 103 | Temp 97.7°F | Resp 18 | Ht 66.5 in | Wt 188.0 lb

## 2013-12-06 DIAGNOSIS — M069 Rheumatoid arthritis, unspecified: Secondary | ICD-10-CM | POA: Diagnosis not present

## 2013-12-06 DIAGNOSIS — D638 Anemia in other chronic diseases classified elsewhere: Secondary | ICD-10-CM | POA: Diagnosis not present

## 2013-12-06 DIAGNOSIS — D509 Iron deficiency anemia, unspecified: Secondary | ICD-10-CM | POA: Diagnosis not present

## 2013-12-06 DIAGNOSIS — L97929 Non-pressure chronic ulcer of unspecified part of left lower leg with unspecified severity: Secondary | ICD-10-CM

## 2013-12-06 DIAGNOSIS — Z1211 Encounter for screening for malignant neoplasm of colon: Secondary | ICD-10-CM

## 2013-12-06 DIAGNOSIS — M79606 Pain in leg, unspecified: Secondary | ICD-10-CM

## 2013-12-06 DIAGNOSIS — L97919 Non-pressure chronic ulcer of unspecified part of right lower leg with unspecified severity: Secondary | ICD-10-CM

## 2013-12-06 LAB — IRON AND TIBC
%SAT: 5 % — ABNORMAL LOW (ref 20–55)
IRON: 14 ug/dL — AB (ref 42–145)
TIBC: 268 ug/dL (ref 250–470)
UIBC: 254 ug/dL (ref 125–400)

## 2013-12-06 MED ORDER — OXYCODONE HCL 5 MG PO TABS: 5.0000 mg | ORAL_TABLET | ORAL | Status: DC | PRN

## 2013-12-06 MED ORDER — PREDNISONE 1 MG PO TABS: ORAL_TABLET | ORAL | Status: DC

## 2013-12-06 NOTE — Progress Notes (Unsigned)
Patient ID: [name], female   DOB: 05-27-1960, 53 y.o.   MRN: [id number]   [name], is a 53 y.o. female  YQI:[id number]  [id number]:[id number]  DOB - [birth year]  CC:  Chief Complaint  Patient presents with  . Follow-up    refill on pain med       HPI: [name] is a 53 y.o. female here today for follow up. She has not returned to the wound clinic since her last hospitalization and is being followed at home by wound care nursing. She states that they will be coming to tomorrow (Friday) for wound care. She reports that she has been doing well and that her pain is well controlled with the current medication. Pt is now ready to see a Rheumatologist. She reports a H/A RA and although she has deformity consistent with RA, we have no laboratory diagnostic evidence of RA.  Additionally, her previous radiologic studies show periosteal thickening of hte bones but no findings consistent with RA. Patient has No headache, No chest pain, No abdominal pain - No Nausea, No new weakness tingling or numbness, No Cough - SOB.  Allergies  Allergen Reactions  . Asa [Aspirin] Shortness Of Breath, Rash and Anaphylaxis  . Cat Hair Extract Anaphylaxis  . Dilaudid [Hydromorphone Hcl] Shortness Of Breath and Palpitations  . Tylenol [Acetaminophen] Rash, Other (See Comments) and Anaphylaxis    Other reaction(s): SHORTNESS OF BREATH Salivation and hearing loss  . Ciprofloxacin     Other reaction(s): OTHER  . Dicloxacillin     Other reaction(s): OTHER  . Erythromycin Other (See Comments)    Burn veins  . Hydromorphone     Other reaction(s): OTHER  . Indomethacin Other (See Comments)    Other reaction(s): OTHER Stroke symptoms  . Naproxen Other (See Comments)    Other reaction(s): OTHER Renal failure  . Tramadol     "stroke"  . Alginate-Collagen [Wound Dressings] Rash  . Gold-Containing Drug Products Rash  . Silvadene [Silver Sulfadiazine] Rash   Past Medical History  Diagnosis Date   . Asthma   . Hypertension   . CHF (congestive heart failure)   . Myasthenia gravis   . Heart murmur   . Venous stasis   . DVT (deep venous thrombosis) 2000, 2004    Not on anticoagulation    Current Outpatient Prescriptions on File Prior to Visit  Medication Sig Dispense Refill  . albuterol (PROVENTIL HFA;VENTOLIN HFA) 108 (90 BASE) MCG/ACT inhaler Inhale 4 puffs into the lungs every 6 (six) hours as needed. For shortness of breath    . COD LIVER OIL PO Take 2 capsules by mouth daily.    . diphenhydrAMINE (BENADRYL) 25 MG tablet Take 1 tablet (25 mg total) by mouth every 6 (six) hours as needed. 30 tablet 0  . Emollient (NIVEA) cream Apply 1 application topically daily. Apply all over body    . EPINEPHrine (EPIPEN) 0.3 mg/0.3 mL DEVI Inject 0.3 mg into the muscle as needed. For allergic reaction    . ferrous sulfate 325 (65 FE) MG tablet Take 325-650 mg by mouth daily with breakfast.    . folic acid (FOLVITE) 884 MCG tablet Take 400 mcg by mouth daily.    . Garlic Oil 1660 MG CAPS Take 1,000 mg by mouth daily.    Marland Kitchen lidocaine (XYLOCAINE) 4 % external solution Apply 5 mLs topically 4 (four) times daily. 50 mL 2  . Multiple Vitamin (MULTIVITAMIN WITH MINERALS) TABS Take 1 tablet by mouth daily. 1 gummy    .  Multiple Vitamins-Minerals (ZINC PO) Take 1 tablet by mouth daily.    Marland Kitchen nystatin cream (MYCOSTATIN) Apply 1 application topically 2 (two) times daily. 30 g 0  . Omega-3 Fatty Acids (FISH OIL) 500 MG CAPS Take 1,000 mg by mouth daily.    Marland Kitchen pyridostigmine (MESTINON) 60 MG tablet Take 1 tablet (60 mg total) by mouth 4 (four) times daily. 360 tablet 4   No current facility-administered medications on file prior to visit.   Family History  Problem Relation Age of Onset  . Depression    . Cancer     History   Social History  . Marital Status: Single    Spouse Name: N/A    Number of Children: N/A  . Years of Education: college   Occupational History  .      Disabled   Social  History Main Topics  . Smoking status: Never Smoker   . Smokeless tobacco: Never Used  . Alcohol Use: No  . Drug Use: No  . Sexual Activity: No   Other Topics Concern  . Not on file   Social History Narrative   Patient lives at home alone.   Disabled.   Education college.   Right handed.   Caffeine patient drinks coffee.    Review of Systems: Constitutional: Negative for fever, chills, diaphoresis, activity change, appetite change and fatigue. HENT: Negative for ear pain, nosebleeds, congestion, facial swelling, rhinorrhea, neck pain, neck stiffness and ear discharge.  Eyes: Negative for pain, discharge, redness, itching and visual disturbance. Respiratory: Negative for cough, choking, chest tightness, shortness of breath, wheezing and stridor.  Cardiovascular: Negative for chest pain, palpitations and leg swelling. Gastrointestinal: Negative for abdominal distention. Genitourinary: Negative for dysuria, urgency, frequency, hematuria, flank pain, decreased urine volume, difficulty urinating and dyspareunia.  Musculoskeletal: Negative for back pain, joint swelling, arthralgia and gait problem. Neurological: Negative for dizziness, tremors, seizures, syncope, facial asymmetry, speech difficulty, weakness, light-headedness, numbness and headaches.  Hematological: Negative for adenopathy. Does not bruise/bleed easily. Psychiatric/Behavioral: Negative for hallucinations, behavioral problems, confusion, dysphoric mood, decreased concentration and agitation.    Objective:    Filed Vitals:   12/06/13 1440  BP: 168/84  Pulse: 103  Temp: 97.7 F (36.5 C)  Resp: 18    Physical Exam: Constitutional: Patient appears well-developed and well-nourished. No distress. HENT: Normocephalic, atraumatic, External right and left ear normal. Oropharynx is clear and moist.  Eyes: Conjunctivae and EOM are normal. PERRLA, no scleral icterus. Neck: Normal ROM. Neck supple. No JVD. No tracheal  deviation. No thyromegaly. CVS: RRR, S1/S2 +, no murmurs, no gallops, no carotid bruit.  Pulmonary: Effort and breath sounds normal, no stridor, rhonchi, wheezes, rales.  Abdominal: Soft. BS +, no distension, tenderness, rebound or guarding.  Musculoskeletal: Normal range of motion. No edema and no tenderness.  Lymphadenopathy: No lymphadenopathy noted, cervical, inguinal or axillary Neuro: Alert. Normal reflexes, muscle tone coordination. No cranial nerve deficit. Skin: Skin is warm and dry. No rash noted. Not diaphoretic. No erythema. No pallor. Psychiatric: Normal mood and affect. Behavior, judgment, thought content normal.  Lab Results  Component Value Date   WBC 4.7 11/27/2013   HGB 8.4* 11/27/2013   HCT 27.1* 11/27/2013   MCV 65.0* 11/27/2013   PLT 313 11/27/2013   Lab Results  Component Value Date   CREATININE 0.85 11/27/2013   BUN 13 11/27/2013   NA 135 11/27/2013   K 4.3 11/27/2013   CL 104 11/27/2013   CO2 21 11/27/2013    No results  found for: HGBA1C Lipid Panel     Component Value Date/Time   CHOL  08/03/2008 1105    75        ATP III CLASSIFICATION:  <200     mg/dL   Desirable  200-239  mg/dL   Borderline High  >=240    mg/dL   High          TRIG 110 08/03/2008 1105   HDL 38* 08/03/2008 1105   CHOLHDL 2.0 08/03/2008 1105   VLDL 22 08/03/2008 1105   LDLCALC  08/03/2008 1105    15        Total Cholesterol/HDL:CHD Risk Coronary Heart Disease Risk Table                     Men   Women  1/2 Average Risk   3.4   3.3  Average Risk       5.0   4.4  2 X Average Risk   9.6   7.1  3 X Average Risk  23.4   11.0        Use the calculated Patient Ratio above and the CHD Risk Table to determine the patient's CHD Risk.        ATP III CLASSIFICATION (LDL):  <100     mg/dL   Optimal  100-129  mg/dL   Near or Above                    Optimal  130-159  mg/dL   Borderline  160-189  mg/dL   High  >190     mg/dL   Very High       Assessment and plan:   1.  Rheumatoid arthritis *** - Cyclic Citrul Peptide Antibody, IGG - predniSONE (DELTASONE) 1 MG tablet; 60 mg po daily x 1, then 50 mg po daily x 1, then 40 mg po x 1 then, 30 mg po x 1 then, 20 mg po x 1, then 10 mg po x 1 then stop  Dispense: 21 tablet; Refill: 0  2. Iron deficiency anemia *** - Iron and TIBC  3. Anemia of chronic disease *** - Iron and TIBC  4. Ulcers of both lower legs *** - oxyCODONE (OXY IR/ROXICODONE) 5 MG immediate release tablet; Take 1 tablet (5 mg total) by mouth every 4 (four) hours as needed for severe pain.  Dispense: 90 tablet; Refill: 0  5. Pain of lower extremity, unspecified laterality *** - oxyCODONE (OXY IR/ROXICODONE) 5 MG immediate release tablet; Take 1 tablet (5 mg total) by mouth every 4 (four) hours as needed for severe pain.  Dispense: 90 tablet; Refill: 0  6. Screening for colon cancer *** - Ambulatory referral to Gastroenterology   No Follow-up on file.  The patient was given clear instructions to go to ER or return to medical center if symptoms don't improve, worsen or new problems develop. The patient verbalized understanding. The patient was told to call to get lab results if they haven't heard anything in the next week.     This note has been created with Surveyor, quantity. Any transcriptional errors are unintentional.    Frankie Scipio A., MD [place], [place]   12/06/2013, 3:09 PM

## 2013-12-07 ENCOUNTER — Telehealth: Payer: Self-pay

## 2013-12-07 LAB — CYCLIC CITRUL PEPTIDE ANTIBODY, IGG: Cyclic Citrullin Peptide Ab: <2 U/mL (ref 0.0–5.0)

## 2013-12-07 NOTE — Telephone Encounter (Signed)
Referral for Rheumatology faxed to [facility] today 12/07/2013.  Referral for Ophthalmology faxed to Dr. Gertie Exon office today 12/07/2013. Thanks!

## 2013-12-08 DIAGNOSIS — I890 Lymphedema, not elsewhere classified: Secondary | ICD-10-CM | POA: Diagnosis not present

## 2013-12-08 DIAGNOSIS — I872 Venous insufficiency (chronic) (peripheral): Secondary | ICD-10-CM | POA: Diagnosis not present

## 2013-12-08 DIAGNOSIS — I87013 Postthrombotic syndrome with ulcer of bilateral lower extremity: Secondary | ICD-10-CM | POA: Diagnosis not present

## 2013-12-08 DIAGNOSIS — L97829 Non-pressure chronic ulcer of other part of left lower leg with unspecified severity: Secondary | ICD-10-CM | POA: Diagnosis not present

## 2013-12-08 DIAGNOSIS — L97819 Non-pressure chronic ulcer of other part of right lower leg with unspecified severity: Secondary | ICD-10-CM | POA: Diagnosis not present

## 2013-12-08 DIAGNOSIS — B369 Superficial mycosis, unspecified: Secondary | ICD-10-CM | POA: Diagnosis not present

## 2013-12-10 DIAGNOSIS — B369 Superficial mycosis, unspecified: Secondary | ICD-10-CM | POA: Diagnosis not present

## 2013-12-10 DIAGNOSIS — L97829 Non-pressure chronic ulcer of other part of left lower leg with unspecified severity: Secondary | ICD-10-CM | POA: Diagnosis not present

## 2013-12-10 DIAGNOSIS — I872 Venous insufficiency (chronic) (peripheral): Secondary | ICD-10-CM | POA: Diagnosis not present

## 2013-12-10 DIAGNOSIS — I87013 Postthrombotic syndrome with ulcer of bilateral lower extremity: Secondary | ICD-10-CM | POA: Diagnosis not present

## 2013-12-10 DIAGNOSIS — L97819 Non-pressure chronic ulcer of other part of right lower leg with unspecified severity: Secondary | ICD-10-CM | POA: Diagnosis not present

## 2013-12-10 DIAGNOSIS — I890 Lymphedema, not elsewhere classified: Secondary | ICD-10-CM | POA: Diagnosis not present

## 2013-12-12 DIAGNOSIS — B369 Superficial mycosis, unspecified: Secondary | ICD-10-CM | POA: Diagnosis not present

## 2013-12-12 DIAGNOSIS — I890 Lymphedema, not elsewhere classified: Secondary | ICD-10-CM | POA: Diagnosis not present

## 2013-12-12 DIAGNOSIS — I872 Venous insufficiency (chronic) (peripheral): Secondary | ICD-10-CM | POA: Diagnosis not present

## 2013-12-12 DIAGNOSIS — L97819 Non-pressure chronic ulcer of other part of right lower leg with unspecified severity: Secondary | ICD-10-CM | POA: Diagnosis not present

## 2013-12-12 DIAGNOSIS — L97829 Non-pressure chronic ulcer of other part of left lower leg with unspecified severity: Secondary | ICD-10-CM | POA: Diagnosis not present

## 2013-12-12 DIAGNOSIS — I87013 Postthrombotic syndrome with ulcer of bilateral lower extremity: Secondary | ICD-10-CM | POA: Diagnosis not present

## 2013-12-20 ENCOUNTER — Emergency Department (HOSPITAL_COMMUNITY): Payer: Medicare Other

## 2013-12-20 ENCOUNTER — Inpatient Hospital Stay (HOSPITAL_COMMUNITY)
Admission: EM | Admit: 2013-12-20 | Discharge: 2013-12-24 | DRG: 603 | Disposition: A | Discharge status: Home / Self Care | Payer: Medicare Other | Attending: Internal Medicine | Admitting: Internal Medicine

## 2013-12-20 ENCOUNTER — Inpatient Hospital Stay (HOSPITAL_COMMUNITY): Payer: Medicare Other

## 2013-12-20 ENCOUNTER — Encounter (HOSPITAL_COMMUNITY): Payer: Self-pay | Admitting: Emergency Medicine

## 2013-12-20 DIAGNOSIS — L03115 Cellulitis of right lower limb: Secondary | ICD-10-CM | POA: Diagnosis not present

## 2013-12-20 DIAGNOSIS — L03126 Acute lymphangitis of left lower limb: Secondary | ICD-10-CM | POA: Diagnosis not present

## 2013-12-20 DIAGNOSIS — L89611 Pressure ulcer of right heel, stage 1: Secondary | ICD-10-CM | POA: Diagnosis present

## 2013-12-20 DIAGNOSIS — Z881 Allergy status to other antibiotic agents status: Secondary | ICD-10-CM | POA: Diagnosis not present

## 2013-12-20 DIAGNOSIS — L02416 Cutaneous abscess of left lower limb: Secondary | ICD-10-CM | POA: Diagnosis not present

## 2013-12-20 DIAGNOSIS — L02415 Cutaneous abscess of right lower limb: Secondary | ICD-10-CM | POA: Diagnosis not present

## 2013-12-20 DIAGNOSIS — R000 Tachycardia, unspecified: Secondary | ICD-10-CM | POA: Diagnosis present

## 2013-12-20 DIAGNOSIS — M79661 Pain in right lower leg: Secondary | ICD-10-CM | POA: Diagnosis not present

## 2013-12-20 DIAGNOSIS — L03116 Cellulitis of left lower limb: Secondary | ICD-10-CM | POA: Diagnosis not present

## 2013-12-20 DIAGNOSIS — D638 Anemia in other chronic diseases classified elsewhere: Secondary | ICD-10-CM | POA: Diagnosis not present

## 2013-12-20 DIAGNOSIS — G7000 Myasthenia gravis without (acute) exacerbation: Secondary | ICD-10-CM | POA: Diagnosis not present

## 2013-12-20 DIAGNOSIS — I509 Heart failure, unspecified: Secondary | ICD-10-CM | POA: Diagnosis present

## 2013-12-20 DIAGNOSIS — S8992XA Unspecified injury of left lower leg, initial encounter: Secondary | ICD-10-CM | POA: Diagnosis not present

## 2013-12-20 DIAGNOSIS — M7989 Other specified soft tissue disorders: Secondary | ICD-10-CM | POA: Diagnosis not present

## 2013-12-20 DIAGNOSIS — I83009 Varicose veins of unspecified lower extremity with ulcer of unspecified site: Secondary | ICD-10-CM | POA: Diagnosis present

## 2013-12-20 DIAGNOSIS — I83008 Varicose veins of unspecified lower extremity with ulcer other part of lower leg: Secondary | ICD-10-CM | POA: Diagnosis present

## 2013-12-20 DIAGNOSIS — M79606 Pain in leg, unspecified: Secondary | ICD-10-CM | POA: Diagnosis not present

## 2013-12-20 DIAGNOSIS — J45909 Unspecified asthma, uncomplicated: Secondary | ICD-10-CM | POA: Diagnosis present

## 2013-12-20 DIAGNOSIS — Z888 Allergy status to other drugs, medicaments and biological substances status: Secondary | ICD-10-CM | POA: Diagnosis not present

## 2013-12-20 DIAGNOSIS — L03125 Acute lymphangitis of right lower limb: Secondary | ICD-10-CM | POA: Diagnosis not present

## 2013-12-20 DIAGNOSIS — M79605 Pain in left leg: Secondary | ICD-10-CM | POA: Diagnosis not present

## 2013-12-20 DIAGNOSIS — I878 Other specified disorders of veins: Secondary | ICD-10-CM | POA: Diagnosis not present

## 2013-12-20 DIAGNOSIS — B999 Unspecified infectious disease: Secondary | ICD-10-CM | POA: Diagnosis not present

## 2013-12-20 DIAGNOSIS — S3992XA Unspecified injury of lower back, initial encounter: Secondary | ICD-10-CM | POA: Diagnosis not present

## 2013-12-20 DIAGNOSIS — I10 Essential (primary) hypertension: Secondary | ICD-10-CM | POA: Diagnosis present

## 2013-12-20 DIAGNOSIS — G709 Myoneural disorder, unspecified: Secondary | ICD-10-CM | POA: Diagnosis present

## 2013-12-20 DIAGNOSIS — S3993XA Unspecified injury of pelvis, initial encounter: Secondary | ICD-10-CM | POA: Diagnosis not present

## 2013-12-20 DIAGNOSIS — S81801D Unspecified open wound, right lower leg, subsequent encounter: Secondary | ICD-10-CM | POA: Diagnosis not present

## 2013-12-20 DIAGNOSIS — L97929 Non-pressure chronic ulcer of unspecified part of left lower leg with unspecified severity: Secondary | ICD-10-CM

## 2013-12-20 DIAGNOSIS — W19XXXA Unspecified fall, initial encounter: Secondary | ICD-10-CM

## 2013-12-20 DIAGNOSIS — L0390 Cellulitis, unspecified: Secondary | ICD-10-CM | POA: Diagnosis present

## 2013-12-20 DIAGNOSIS — L97919 Non-pressure chronic ulcer of unspecified part of right lower leg with unspecified severity: Secondary | ICD-10-CM

## 2013-12-20 DIAGNOSIS — L02419 Cutaneous abscess of limb, unspecified: Secondary | ICD-10-CM | POA: Diagnosis present

## 2013-12-20 DIAGNOSIS — L03119 Cellulitis of unspecified part of limb: Secondary | ICD-10-CM | POA: Diagnosis present

## 2013-12-20 LAB — CBC WITH DIFFERENTIAL/PLATELET
Basophils Absolute: 0 10*3/uL (ref 0.0–0.1)
Basophils Relative: 0 % (ref 0–1)
EOS PCT: 1 % (ref 0–5)
Eosinophils Absolute: 0.1 10*3/uL (ref 0.0–0.7)
HCT: 27.6 % — ABNORMAL LOW (ref 36.0–46.0)
Hemoglobin: 8.4 g/dL — ABNORMAL LOW (ref 12.0–15.0)
LYMPHS ABS: 0.9 10*3/uL (ref 0.7–4.0)
Lymphocytes Relative: 7 % — ABNORMAL LOW (ref 12–46)
MCH: 19.7 pg — ABNORMAL LOW (ref 26.0–34.0)
MCHC: 30.4 g/dL (ref 30.0–36.0)
MCV: 64.6 fL — AB (ref 78.0–100.0)
MONO ABS: 0.7 10*3/uL (ref 0.1–1.0)
Monocytes Relative: 6 % (ref 3–12)
NEUTROS PCT: 86 % — AB (ref 43–77)
Neutro Abs: 10.5 10*3/uL — ABNORMAL HIGH (ref 1.7–7.7)
Platelets: 336 10*3/uL (ref 150–400)
RBC: 4.27 MIL/uL (ref 3.87–5.11)
RDW: 22.3 % — ABNORMAL HIGH (ref 11.5–15.5)
WBC: 12.2 10*3/uL — ABNORMAL HIGH (ref 4.0–10.5)

## 2013-12-20 LAB — COMPREHENSIVE METABOLIC PANEL
ALBUMIN: 2.8 g/dL — AB (ref 3.5–5.2)
ALT: 32 U/L (ref 0–35)
ANION GAP: 15 (ref 5–15)
AST: 27 U/L (ref 0–37)
Alkaline Phosphatase: 79 U/L (ref 39–117)
BUN: 14 mg/dL (ref 6–23)
CALCIUM: 9.6 mg/dL (ref 8.4–10.5)
CHLORIDE: 101 meq/L (ref 96–112)
CO2: 22 mEq/L (ref 19–32)
CREATININE: 0.81 mg/dL (ref 0.50–1.10)
GFR calc Af Amer: >90 mL/min (ref >90)
GFR, EST NON AFRICAN AMERICAN: 81 mL/min — AB (ref >90)
Glucose, Bld: 106 mg/dL — ABNORMAL HIGH (ref 70–99)
Potassium: 3.7 mEq/L (ref 3.7–5.3)
Sodium: 138 mEq/L (ref 137–147)
Total Bilirubin: 0.6 mg/dL (ref 0.3–1.2)
Total Protein: 7.7 g/dL (ref 6.0–8.3)

## 2013-12-20 LAB — URINALYSIS, ROUTINE W REFLEX MICROSCOPIC
Glucose, UA: NEGATIVE mg/dL
Hgb urine dipstick: NEGATIVE
KETONES UR: NEGATIVE mg/dL
Leukocytes, UA: NEGATIVE
NITRITE: NEGATIVE
Protein, ur: 100 mg/dL — AB
Specific Gravity, Urine: 1.023 (ref 1.005–1.030)
UROBILINOGEN UA: 0.2 mg/dL (ref 0.0–1.0)
pH: 6 (ref 5.0–8.0)

## 2013-12-20 LAB — I-STAT CG4 LACTIC ACID, ED: LACTIC ACID, VENOUS: 1.19 mmol/L (ref 0.5–2.2)

## 2013-12-20 LAB — MAGNESIUM: MAGNESIUM: 2.1 mg/dL (ref 1.5–2.5)

## 2013-12-20 LAB — URINE MICROSCOPIC-ADD ON

## 2013-12-20 MED ORDER — PIPERACILLIN-TAZOBACTAM 3.375 G IVPB
3.3750 g | Freq: Three times a day (TID) | INTRAVENOUS | Status: DC
  Administered 2013-12-20 – 2013-12-22 (×4): 3.375 g via INTRAVENOUS
  Filled 2013-12-20 (×6): qty 50

## 2013-12-20 MED ORDER — SODIUM CHLORIDE 0.9 % IJ SOLN
3.0000 mL | Freq: Two times a day (BID) | INTRAMUSCULAR | Status: DC
  Administered 2013-12-22 – 2013-12-24 (×4): 3 mL via INTRAVENOUS

## 2013-12-20 MED ORDER — VANCOMYCIN HCL IN DEXTROSE 1-5 GM/200ML-% IV SOLN
1000.0000 mg | INTRAVENOUS | Status: AC
  Administered 2013-12-20: 1000 mg via INTRAVENOUS
  Filled 2013-12-20: qty 200

## 2013-12-20 MED ORDER — SODIUM CHLORIDE 0.9 % IV BOLUS (SEPSIS)
1000.0000 mL | Freq: Once | INTRAVENOUS | Status: AC
  Administered 2013-12-20: 1000 mL via INTRAVENOUS

## 2013-12-20 MED ORDER — PIPERACILLIN-TAZOBACTAM 3.375 G IVPB 30 MIN
3.3750 g | INTRAVENOUS | Status: AC
  Administered 2013-12-20: 3.375 g via INTRAVENOUS
  Filled 2013-12-20: qty 50

## 2013-12-20 MED ORDER — OXYCODONE HCL 5 MG PO TABS
5.0000 mg | ORAL_TABLET | Freq: Four times a day (QID) | ORAL | Status: DC | PRN
  Administered 2013-12-20 (×2): 5 mg via ORAL
  Filled 2013-12-20 (×2): qty 1

## 2013-12-20 MED ORDER — DOCUSATE SODIUM 100 MG PO CAPS
100.0000 mg | ORAL_CAPSULE | Freq: Two times a day (BID) | ORAL | Status: DC
  Administered 2013-12-20 – 2013-12-24 (×8): 100 mg via ORAL
  Filled 2013-12-20 (×9): qty 1

## 2013-12-20 MED ORDER — ONDANSETRON HCL 4 MG PO TABS: 4.0000 mg | ORAL_TABLET | Freq: Four times a day (QID) | ORAL | Status: DC | PRN

## 2013-12-20 MED ORDER — HYDRALAZINE HCL 20 MG/ML IJ SOLN
10.0000 mg | Freq: Four times a day (QID) | INTRAMUSCULAR | Status: DC | PRN
  Administered 2013-12-20: 10 mg via INTRAVENOUS
  Filled 2013-12-20: qty 1

## 2013-12-20 MED ORDER — VANCOMYCIN HCL IN DEXTROSE 1-5 GM/200ML-% IV SOLN
1000.0000 mg | Freq: Three times a day (TID) | INTRAVENOUS | Status: DC
  Administered 2013-12-21 (×2): 1000 mg via INTRAVENOUS
  Filled 2013-12-20 (×3): qty 200

## 2013-12-20 MED ORDER — ACETAMINOPHEN 650 MG RE SUPP: 650.0000 mg | Freq: Four times a day (QID) | RECTAL | Status: DC | PRN

## 2013-12-20 MED ORDER — DIPHENHYDRAMINE HCL 25 MG PO CAPS
25.0000 mg | ORAL_CAPSULE | Freq: Once | ORAL | Status: AC
  Administered 2013-12-21: 25 mg via ORAL
  Filled 2013-12-20: qty 1

## 2013-12-20 MED ORDER — SODIUM CHLORIDE 0.9 % IV SOLN
INTRAVENOUS | Status: DC
  Administered 2013-12-20: 16:00:00 via INTRAVENOUS

## 2013-12-20 MED ORDER — PYRIDOSTIGMINE BROMIDE 60 MG PO TABS
60.0000 mg | ORAL_TABLET | Freq: Four times a day (QID) | ORAL | Status: DC
  Administered 2013-12-20 – 2013-12-24 (×15): 60 mg via ORAL
  Filled 2013-12-20 (×18): qty 1

## 2013-12-20 MED ORDER — ACETAMINOPHEN 325 MG PO TABS: 650.0000 mg | ORAL_TABLET | Freq: Four times a day (QID) | ORAL | Status: DC | PRN

## 2013-12-20 MED ORDER — ONDANSETRON HCL 4 MG/2ML IJ SOLN: 4.0000 mg | Freq: Four times a day (QID) | INTRAMUSCULAR | Status: DC | PRN

## 2013-12-20 MED ORDER — ENOXAPARIN SODIUM 40 MG/0.4ML ~~LOC~~ SOLN
40.0000 mg | SUBCUTANEOUS | Status: DC
  Administered 2013-12-20 – 2013-12-23 (×4): 40 mg via SUBCUTANEOUS
  Filled 2013-12-20 (×5): qty 0.4

## 2013-12-20 MED ORDER — OXYCODONE HCL 5 MG PO TABS
5.0000 mg | ORAL_TABLET | ORAL | Status: DC | PRN
  Administered 2013-12-21 – 2013-12-24 (×19): 5 mg via ORAL
  Filled 2013-12-20 (×20): qty 1

## 2013-12-20 NOTE — ED Provider Notes (Signed)
CSN: [id number]     Arrival date & time 12/20/13  1336 History   First MD Initiated Contact with Patient 12/20/13 1338     Chief Complaint  Patient presents with  . Fall  . Wound Infection     (Consider location/radiation/quality/duration/timing/severity/associated sxs/prior Treatment) Patient is a 53 y.o. female presenting with fall. The history is provided by the patient. No language interpreter was used.  Fall Pertinent negatives include no chest pain, chills, fever, headaches, neck pain or weakness.  [name] is a 53 y/o F with PMHx of DVT, HTN, CHF, venous stasis, myasthenia gravis, asthma presenting to the ED with fall and wound infection. Patient reported that she had a fall earlier this morning, 10:30 AM. Patient reported that after watching the Thanksgiving day parade she was cleaning and got excited resulting in her to fall and land on her buttocks. Patient reported that she has pain in her back and buttocks as well, reported that she was told by EMS that she has skin tears present. Patient reported that she has history of venous stasis - stated that she is supposed to get her wounds cleaned and bandages changed every 6 hours - reported that she has a home health aide nurse that comes to see her to perform the bandage changes. Patient reported that nurse aide has not come to perform wound care for approximately 2 weeks. Patient reported that she has noticed increased swelling, erythema, and drainage. Stated that the smell of her wounds has increased tremendously. Denied fever, chills, neck pain, neck stiffness, head injury, loss of conscious, visual changes, chest pain/shortness of breath/difficulty breathing pre and post fall, numbness, tingling, loss of sensation. PCP [name]  Past Medical History  Diagnosis Date  . Asthma   . Hypertension   . CHF (congestive heart failure)   . Myasthenia gravis   . Heart murmur   . Venous stasis   . DVT (deep venous thrombosis)  2000, 2004    Not on anticoagulation    Past Surgical History  Procedure Laterality Date  . Tonsillectomy    . Myomectomy    . Failed bilateral leg grafts     Family History  Problem Relation Age of Onset  . Depression    . Cancer     History  Substance Use Topics  . Smoking status: Never Smoker   . Smokeless tobacco: Never Used  . Alcohol Use: No   OB History    No data available     Review of Systems  Constitutional: Negative for fever and chills.  Respiratory: Negative for shortness of breath.   Cardiovascular: Negative for chest pain.  Musculoskeletal: Positive for back pain. Negative for neck pain and neck stiffness.  Skin: Positive for wound.  Neurological: Negative for weakness and headaches.      Allergies  Asa; Cat hair extract; Dilaudid; Tylenol; Ciprofloxacin; Dicloxacillin; Erythromycin; Hydromorphone; Indomethacin; Naproxen; Tramadol; Alginate-collagen; Gold-containing drug products; and Silvadene  Home Medications   Prior to Admission medications   Medication Sig Start Date End Date Taking? Authorizing Provider  albuterol (PROVENTIL HFA;VENTOLIN HFA) 108 (90 BASE) MCG/ACT inhaler Inhale 4 puffs into the lungs every 6 (six) hours as needed. For shortness of breath    Historical Provider, MD  COD LIVER OIL PO Take 2 capsules by mouth daily.    Historical Provider, MD  diphenhydrAMINE (BENADRYL) 25 MG tablet Take 1 tablet (25 mg total) by mouth every 6 (six) hours as needed. 09/09/13   Louellen Molder, MD  Emollient (NIVEA) cream Apply 1 application topically daily. Apply all over body    Historical Provider, MD  EPINEPHrine (EPIPEN) 0.3 mg/0.3 mL DEVI Inject 0.3 mg into the muscle as needed. For allergic reaction    Historical Provider, MD  ferrous sulfate 325 (65 FE) MG tablet Take 325-650 mg by mouth daily with breakfast.    Historical Provider, MD  folic acid (FOLVITE) 330 MCG tablet Take 400 mcg by mouth daily.    Historical Provider, MD  Garlic Oil  0762 MG CAPS Take 1,000 mg by mouth daily.    Historical Provider, MD  lidocaine (XYLOCAINE) 4 % external solution Apply 5 mLs topically 4 (four) times daily. 10/15/13   Leana Gamer, MD  Multiple Vitamin (MULTIVITAMIN WITH MINERALS) TABS Take 1 tablet by mouth daily. 1 gummy    Historical Provider, MD  Multiple Vitamins-Minerals (ZINC PO) Take 1 tablet by mouth daily.    Historical Provider, MD  nystatin cream (MYCOSTATIN) Apply 1 application topically 2 (two) times daily. 09/27/13   Leana Gamer, MD  Omega-3 Fatty Acids (FISH OIL) 500 MG CAPS Take 1,000 mg by mouth daily.    Historical Provider, MD  oxyCODONE (OXY IR/ROXICODONE) 5 MG immediate release tablet Take 1 tablet (5 mg total) by mouth every 4 (four) hours as needed for severe pain. 12/06/13   Leana Gamer, MD  predniSONE (DELTASONE) 1 MG tablet 60 mg po daily x 1, then 50 mg po daily x 1, then 40 mg po x 1 then, 30 mg po x 1 then, 20 mg po x 1, then 10 mg po x 1 then stop 12/06/13   Leana Gamer, MD  pyridostigmine (MESTINON) 60 MG tablet Take 1 tablet (60 mg total) by mouth 4 (four) times daily. 10/05/13   Marcial Pacas, MD   BP 173/87 mmHg  Pulse 111  Temp(Src) 98.8 F (37.1 C) (Oral)  Resp 20  Ht 5\' 6"  (1.676 m)  Wt 188 lb (85.276 kg)  BMI 30.36 kg/m2  SpO2 100%  LMP 07/12/2011 Physical Exam  Constitutional: She is oriented to person, place, and time. She appears well-developed and well-nourished. No distress.  HENT:  Head: Normocephalic and atraumatic.  Eyes: Conjunctivae and EOM are normal. Pupils are equal, round, and reactive to light. Right eye exhibits no discharge. Left eye exhibits no discharge.  Neck: Normal range of motion. Neck supple. No tracheal deviation present.  Negative neck stiffness Negative nuchal rigidity Negative cervical lymphadenopathy Negative meningeal signs Negative pain upon palpation to C-spine  Cardiovascular: Normal rate, regular rhythm and normal heart sounds.  Exam  reveals no friction rub.   No murmur heard. Pulses:      Radial pulses are 2+ on the right side, and 2+ on the left side.       Dorsalis pedis pulses are 1+ on the right side, and 1+ on the left side.  Decreased pedal pulses, DP bilaterally. Mild whitening of skin noted to the tips of the digits of the feet - signs of ischemia. Toes cold to the touch.   Pulmonary/Chest: Effort normal and breath sounds normal. No respiratory distress. She has no wheezes. She has no rales.  Musculoskeletal: Normal range of motion. She exhibits tenderness.       Lumbar back: She exhibits tenderness and bony tenderness. She exhibits normal range of motion, no swelling, no edema, no deformity, no laceration and no pain.       Back:  Negative deformities or malalignments identified to the  spine. Mild discomfort upon palpation to the lumbosacral vertebral and paravertebral regions bilaterally.  Ecchymosis identified to the posterior aspect of the left buttock and right buttock extending down to mid thigh bilaterally with discomfort upon palpation. Negative crepitus. Skin tears identified, scattered to the posterior aspect of the thighs bilaterally - negative bleeding noted. Patient is able to wiggle toes without difficulty. Full flexion extension identified to the knees bilaterally.  Lymphadenopathy:    She has no cervical adenopathy.  Neurological: She is alert and oriented to person, place, and time. No cranial nerve deficit. She exhibits normal muscle tone. Coordination normal.  Cranial nerves III-XII grossly intact Strength 5+/5+ to upper and lower extremities bilaterally with resistance applied, equal distribution noted Patient follows commands well Patient answers questions appropriately  Skin: She is not diaphoretic.     Negative sacral decubitus ulcer identified  Bilateral tib-fib circumferentially noted to have open wounds with positive drainage of clear fluid with a strong odor and erythema. Variations in  color noted - red, white, pink. Swelling noted to the lower extremities bilaterally. Negative bleeding. Maggots identified on wounds of all different stages of development. Old bandages at here to the skin, when peeled away sloughing of the skin identified.  Psychiatric: She has a normal mood and affect. Her behavior is normal. Thought content normal.  Nursing note and vitals reviewed.   ED Course  Procedures (including critical care time) Labs Review Labs Reviewed  CBC WITH DIFFERENTIAL - Abnormal; Notable for the following:    WBC 12.2 (*)    Hemoglobin 8.4 (*)    HCT 27.6 (*)    MCV 64.6 (*)    MCH 19.7 (*)    RDW 22.3 (*)    Neutrophils Relative % 86 (*)    Lymphocytes Relative 7 (*)    Neutro Abs 10.5 (*)    All other components within normal limits  COMPREHENSIVE METABOLIC PANEL - Abnormal; Notable for the following:    Glucose, Bld 106 (*)    Albumin 2.8 (*)    GFR calc non Af Amer 81 (*)    All other components within normal limits  CULTURE, BLOOD (ROUTINE X 2)  CULTURE, BLOOD (ROUTINE X 2)  WOUND CULTURE  WOUND CULTURE  URINALYSIS, ROUTINE W REFLEX MICROSCOPIC  HEMOGLOBIN A1C  MAGNESIUM  TSH  I-STAT CG4 LACTIC ACID, ED    Imaging Review Dg Lumbar Spine Complete  12/20/2013   CLINICAL DATA:  The patient fell earlier today  EXAM: LUMBAR SPINE - COMPLETE 4+ VIEW  COMPARISON:  None.  FINDINGS: Frontal, lateral, spot lumbosacral lateral, and bilateral oblique views were obtained. There are 5 non-rib-bearing lumbar type vertebral bodies. There is no fracture or spondylolisthesis. There is moderate disc space narrowing at L5-S1. There is slight disc space narrowing at L3-4 and L4-5. There is facet osteoarthritic change at L4-5 and L5-S1 bilaterally. No erosive change.  IMPRESSION: Areas of osteoarthritic change.  No fracture or spondylolisthesis.   Electronically Signed   By: Lowella Grip M.D.   On: 12/20/2013 16:22   Dg Pelvis 1-2 Views  12/20/2013   CLINICAL DATA:   53 year old female with history of trauma from a fall 10:30 this morning with injury to the buttocks, with small skin tear on the left buttocks.  EXAM: PELVIS - 1-2 VIEW  COMPARISON:  No priors.  FINDINGS: There is no evidence of pelvic fracture or diastasis. No pelvic bone lesions are seen.  IMPRESSION: Negative.   Electronically Signed   By: Quillian Quince  Entrikin M.D.   On: 12/20/2013 15:22   Dg Tibia/fibula Left  12/20/2013   CLINICAL DATA:  Pt fell at 10:30 this AM, landed on buttocks, per EMS small skin tear left buttocks  EXAM: LEFT TIBIA AND FIBULA - 2 VIEW  COMPARISON:  Radiograph 08/26/2010.  FINDINGS: There is periosteal thickening in the distal diaphysis of the tibia and fibula. These findings are similar to comparison exam. No evidence of acute fracture. There is no subcutaneous gas. There are a calcifications within the scan which are new.  IMPRESSION: 1. Periosteal thickening of the distal tibia and fibula. Cannot exclude chronic osteomyelitis. 2. New skin calcifications. 3. No evidence of fracture.   Electronically Signed   By: Suzy Bouchard M.D.   On: 12/20/2013 15:28   Dg Tibia/fibula Right  12/20/2013   CLINICAL DATA:  Pt fell at 10:30 this AM, landed on buttocks, per EMS small skin tear left buttocks. Leg wounds observed with maggots and adhered bandages to skin. EMS states majority of bandages removed, however some were attached to skin. Bilateral lower leg wounds, pain, redness, and swelling.Low back pain. Best obtainable images due to pt condition.  EXAM: RIGHT TIBIA AND FIBULA - 2 VIEW  COMPARISON:  08/26/2010  FINDINGS: There is degenerative fusion of the right knee. No evidence for acute fracture. No radiopaque foreign body or soft tissue gas. There is irregularity of the soft tissues of the calf.  IMPRESSION: 1. Degenerative fusion of the knee. 2. Irregularity of the soft tissues.   Electronically Signed   By: Shon Hale M.D.   On: 12/20/2013 15:28     EKG Interpretation None        Results for orders placed or performed during the hospital encounter of 12/20/13  CBC with Differential  Result Value Ref Range   WBC 12.2 (H) 4.0 - 10.5 K/uL   RBC 4.27 3.87 - 5.11 MIL/uL   Hemoglobin 8.4 (L) 12.0 - 15.0 g/dL   HCT 27.6 (L) 36.0 - 46.0 %   MCV 64.6 (L) 78.0 - 100.0 fL   MCH 19.7 (L) 26.0 - 34.0 pg   MCHC 30.4 30.0 - 36.0 g/dL   RDW 22.3 (H) 11.5 - 15.5 %   Platelets 336 150 - 400 K/uL   Neutrophils Relative % 86 (H) 43 - 77 %   Lymphocytes Relative 7 (L) 12 - 46 %   Monocytes Relative 6 3 - 12 %   Eosinophils Relative 1 0 - 5 %   Basophils Relative 0 0 - 1 %   Neutro Abs 10.5 (H) 1.7 - 7.7 K/uL   Lymphs Abs 0.9 0.7 - 4.0 K/uL   Monocytes Absolute 0.7 0.1 - 1.0 K/uL   Eosinophils Absolute 0.1 0.0 - 0.7 K/uL   Basophils Absolute 0.0 0.0 - 0.1 K/uL   RBC Morphology POLYCHROMASIA PRESENT    WBC Morphology ANISOCYTES   Comprehensive metabolic panel  Result Value Ref Range   Sodium 138 137 - 147 mEq/L   Potassium 3.7 3.7 - 5.3 mEq/L   Chloride 101 96 - 112 mEq/L   CO2 22 19 - 32 mEq/L   Glucose, Bld 106 (H) 70 - 99 mg/dL   BUN 14 6 - 23 mg/dL   Creatinine, Ser 0.81 0.50 - 1.10 mg/dL   Calcium 9.6 8.4 - 10.5 mg/dL   Total Protein 7.7 6.0 - 8.3 g/dL   Albumin 2.8 (L) 3.5 - 5.2 g/dL   AST 27 0 - 37 U/L   ALT 32  0 - 35 U/L   Alkaline Phosphatase 79 39 - 117 U/L   Total Bilirubin 0.6 0.3 - 1.2 mg/dL   GFR calc non Af Amer 81 (L) >90 mL/min   GFR calc Af Amer >90 >90 mL/min   Anion gap 15 5 - 15  I-Stat CG4 Lactic Acid, ED  Result Value Ref Range   Lactic Acid, Venous 1.19 0.5 - 2.2 mmol/L     2:27 PM Patient seen and assessed by attending physician, Dr. Rockwell Alexandria. As per physician recommended patient to be started on IV antibiotics.   3:15 PM This provider spoke with Dr. Murrell Converse, [facility] - discussed case, labs, vitals, ED setting. Patient to be admitted to Telemetry floor.    MDM   Final diagnoses:  Fall  Cellulitis of right lower  extremity  Cellulitis of left lower extremity  Venous stasis    Medications  enoxaparin (LOVENOX) injection 40 mg (not administered)  sodium chloride 0.9 % injection 3 mL (not administered)  0.9 %  sodium chloride infusion ( Intravenous New Bag/Given 12/20/13 1610)  acetaminophen (TYLENOL) tablet 650 mg (not administered)    Or  acetaminophen (TYLENOL) suppository 650 mg (not administered)  oxyCODONE (Oxy IR/ROXICODONE) immediate release tablet 5 mg (5 mg Oral Given 12/20/13 1609)  docusate sodium (COLACE) capsule 100 mg (not administered)  ondansetron (ZOFRAN) tablet 4 mg (not administered)    Or  ondansetron (ZOFRAN) injection 4 mg (not administered)  piperacillin-tazobactam (ZOSYN) IVPB 3.375 g (3.375 g Intravenous New Bag/Given 12/20/13 1612)  hydrALAZINE (APRESOLINE) injection 10 mg (not administered)  sodium chloride 0.9 % bolus 1,000 mL (0 mLs Intravenous Stopped 12/20/13 1519)  vancomycin (VANCOCIN) IVPB 1000 mg/200 mL premix (1,000 mg Intravenous New Bag/Given 12/20/13 1519)    Filed Vitals:   12/20/13 1335 12/20/13 1336 12/20/13 1348 12/20/13 1451  BP:  173/87 173/87   Pulse:  97 111   Temp:  98.9 F (37.2 C) 98.8 F (37.1 C)   TempSrc:  Oral Oral   Resp:  18 20   Height:    5\' 6"  (1.676 m)  Weight:    188 lb (85.276 kg)  SpO2: 97% 97% 100%    This provider reviewed the patient's chart. Patient was seen and assessed in ED setting back in 2013 for cellulitis and ulcers of the lower extremities bilaterally. Patient was admitted and started on vancomycin.  CBC noted elevated white blood cell count 12.2. Hemoglobin 8.4, hematocrit 27.6- when compared to 3 weeks ago patient had a Hgb of 8.4 and 3 months ago patient had a Hgb of 8.3. CMP unremarkable - negative elevated anion gap. Lactic acid negative elevation. Plain film of pelvis negative for acute osseous injury. Plain film of left tib-fib noted periosteal thickening of the distal tibia and fibula, chronic osteomyelitis  cannot be excluded. Plain film of right tib-fib noted irregularity of the soft tissues with negative findings of gas. Plain film of lumbar spine negative for acute osseous injury-osteophytic changes identified. Blood culture pending 2 and wound cultures from each leg pending. Negative findings of gas in tissue or osteomyelitis. Patient started on IV fluids and empiric antibiotics. Patient to be admitted for wound care and inpatient IV antibiotics for infection of legs. Patient not septic at this time. Discussed plan for admission with patient to understands and agrees to plan of care. Patient stable for transfer.   Jamse Mead, PA-C 12/20/13 1632  Dot Lanes, MD 12/21/13 [phone]

## 2013-12-20 NOTE — ED Notes (Signed)
Pt fell at 10:30 this AM, landed on buttocks, per EMS small skin tear left buttocks. Leg wounds observed with maggots and adhered bandages to skin. EMS states majority of bandages removed, however some were attached to skin.

## 2013-12-20 NOTE — Plan of Care (Signed)
Problem: Phase I Progression Outcomes Goal: Voiding-avoid urinary catheter unless indicated Outcome: Completed/Met Date Met:  12/20/13     

## 2013-12-20 NOTE — ED Notes (Signed)
Called to give report, secretary states they are changing bed assignment and to call back. Will call back in 15 minutes

## 2013-12-20 NOTE — ED Notes (Signed)
Charge nurse called adult protective services.

## 2013-12-20 NOTE — ED Notes (Signed)
Bed: WA02 Expected date:  Expected time:  Means of arrival:  Comments: ems 

## 2013-12-20 NOTE — ED Notes (Signed)
Alerted MD to need for pain medication. 2in in diameter skin tear to buttock. Extensive circumferential wounds to lower legs bilaterally. Weak pulses in both feet, feet cool to touch. Able to move all toes. Different sized maggots in wounds on both legs. Wound base tissue pink/red/white, wet, occasional gray patch. Pt alert and oriented, adult protective services contacted.

## 2013-12-20 NOTE — ED Notes (Signed)
Pt transported to xray 

## 2013-12-20 NOTE — Progress Notes (Signed)
ANTIBIOTIC CONSULT NOTE - INITIAL  Pharmacy Consult for Vancomycin, Zosyn Indication: Wound infection  Allergies  Allergen Reactions  . Asa [Aspirin] Shortness Of Breath, Rash and Anaphylaxis  . Cat Hair Extract Anaphylaxis  . Dilaudid [Hydromorphone Hcl] Shortness Of Breath and Palpitations  . Tylenol [Acetaminophen] Rash, Other (See Comments) and Anaphylaxis    Other reaction(s): SHORTNESS OF BREATH Salivation and hearing loss  . Ciprofloxacin     Other reaction(s): OTHER  . Dicloxacillin     Other reaction(s): OTHER  . Erythromycin Other (See Comments)    Burn veins  . Hydromorphone     Other reaction(s): OTHER  . Indomethacin Other (See Comments)    Other reaction(s): OTHER Stroke symptoms  . Naproxen Other (See Comments)    Other reaction(s): OTHER Renal failure  . Tramadol     "stroke"  . Alginate-Collagen [Wound Dressings] Rash  . Gold-Containing Drug Products Rash  . Silvadene [Silver Sulfadiazine] Rash    Patient Measurements: Height: 5\' 6"  (167.6 cm) Weight: 188 lb (85.276 kg) IBW/kg (Calculated) : 59.3  Vital Signs: Temp: 98.8 F (37.1 C) (11/26 1348) Temp Source: Oral (11/26 1348) BP: 173/87 mmHg (11/26 1348) Pulse Rate: 111 (11/26 1348) Intake/Output from previous day:   Intake/Output from this shift:    Labs:  Recent Labs  12/20/13 1402  WBC 12.2*  HGB 8.4*  PLT 336  CREATININE 0.81   Estimated Creatinine Clearance: 87.4 mL/min (by C-G formula based on Cr of 0.81). No results for input(s): VANCOTROUGH, VANCOPEAK, VANCORANDOM, GENTTROUGH, GENTPEAK, GENTRANDOM, TOBRATROUGH, TOBRAPEAK, TOBRARND, AMIKACINPEAK, AMIKACINTROU, AMIKACIN in the last 72 hours.   Microbiology: No results found for this or any previous visit (from the past 720 hour(s)).  Medical History: Past Medical History  Diagnosis Date  . Asthma   . Hypertension   . CHF (congestive heart failure)   . Myasthenia gravis   . Heart murmur   . Venous stasis   . DVT (deep  venous thrombosis) 2000, 2004    Not on anticoagulation      Assessment: 72 yoF [facility]26 with skin tear on buttocks after a fall at home.  She also has chronic venous statis and bilateral circumferential open wound with strong odor and maggots in various stages of development.  She states home health nurses were supposed to assist with wound bandage changes, but have not come in about 2 weeks.  Pharmacy is consulted to dose Vancomycin and Zosyn for wound infection and cellulitis.  11/26 >> Vanc >> 11/26 >> Zosyn >>    Today, 12/20/2013  Tmax: 98.9  WBCs: 12.2  Renal: SCr 0.81, CrCl ~ 87 ml/min  Lactic acid 1.19  Blood cultures pending.   Goal of Therapy:  Vancomycin trough level 15-20 mcg/ml  Plan:  Zosyn 3.375g IV Q8H infused over 4hrs.   Vancomycin 1g IV q8h.  Measure Vanc trough at steady state.  Follow up renal fxn and culture results.   Gretta Arab PharmD, BCPS Pager [phone] 12/20/2013 3:02 PM

## 2013-12-20 NOTE — H&P (Addendum)
[facility] History and Physical  [name] YIR:[id number] DOB: 1960-02-16 DOA: 12/20/2013  Referring physician: PCP: MATTHEWS,MICHELLE A., MD   Chief Complaint: Fall  HPI:  53 year old female with a history of myasthenia gravis, chronic venous stasis ulcers, history of DVT hypertension presents to the ED after a fall.Patient reported that she had a fall earlier this morning, 10:30 AM. Patient states that she lost her balance and fell. Patient reported that she has pain in her back and buttocks as well, reported that she was told by EMS that she has skin tears present. Patient reported that she has history of venous stasis - stated that she is supposed to get her wounds cleaned and bandages changed every 6 hours, she has [facility] and follows with [facility] - she did not receive her supplies for 2-1/2 weeks and therefore wound care stopped coming in.  Patient reported that she has noticed increased swelling, erythema, and drainage. Stated that the smell of her wounds has increased tremendously. She occasionally feels biting into her skin. Denied fever, chills, neck pain, neck stiffness, head injury, loss of conscious, visual changes, chest pain/shortness of breath/difficulty breathing pre and post fall, numbness, tingling, loss of sensation.  In the ED the patient was found to have Extensive circumferential wounds to lower legs bilaterally. Weak pulses in both feet, feet cool to touch. Able to move all toes. Different sized maggots in wounds on both legs. Wound base tissue pink/red/white, wet, occasional gray patch.      Review of Systems: negative for the following  Constitutional: Denies fever, chills, diaphoresis, appetite change and fatigue.  HEENT: Denies photophobia, eye pain, redness, hearing loss, ear pain, congestion, sore throat, rhinorrhea, sneezing, mouth sores, trouble swallowing, neck pain, neck stiffness and tinnitus.  Respiratory: Denies SOB,  DOE, cough, chest tightness, and wheezing.  Cardiovascular: Denies chest pain, palpitations and leg swelling.  Gastrointestinal: Denies nausea, vomiting, abdominal pain, diarrhea, constipation, blood in stool and abdominal distention.  Genitourinary: Denies dysuria, urgency, frequency, hematuria, flank pain and difficulty urinating.  Musculoskeletal: Denies myalgias, back pain, joint swelling, arthralgias and gait problem.  Skin: Foul-smelling odorous bilateral lower extremity wounds, with previous stages of maggots Neurological: Denies dizziness, seizures, syncope, weakness, light-headedness, numbness and headaches.  Hematological: Denies adenopathy. Easy bruising, personal or family bleeding history  Psychiatric/Behavioral: Denies suicidal ideation, mood changes, confusion, nervousness, sleep disturbance and agitation       Past Medical History  Diagnosis Date  . Asthma   . Hypertension   . CHF (congestive heart failure)   . Myasthenia gravis   . Heart murmur   . Venous stasis   . DVT (deep venous thrombosis) 2000, 2004    Not on anticoagulation      Past Surgical History  Procedure Laterality Date  . Tonsillectomy    . Myomectomy    . Failed bilateral leg grafts        Social History:  reports that she has never smoked. She has never used smokeless tobacco. She reports that she does not drink alcohol or use illicit drugs.    Allergies  Allergen Reactions  . Asa [Aspirin] Shortness Of Breath, Rash and Anaphylaxis  . Cat Hair Extract Anaphylaxis  . Dilaudid [Hydromorphone Hcl] Shortness Of Breath and Palpitations  . Tylenol [Acetaminophen] Rash, Other (See Comments) and Anaphylaxis    Other reaction(s): SHORTNESS OF BREATH Salivation and hearing loss  . Ciprofloxacin     Other reaction(s): OTHER  . Dicloxacillin  Other reaction(s): OTHER  . Erythromycin Other (See Comments)    Burn veins  . Hydromorphone     Other reaction(s): OTHER  . Indomethacin Other  (See Comments)    Other reaction(s): OTHER Stroke symptoms  . Naproxen Other (See Comments)    Other reaction(s): OTHER Renal failure  . Tramadol     "stroke"  . Alginate-Collagen [Wound Dressings] Rash  . Gold-Containing Drug Products Rash  . Silvadene [Silver Sulfadiazine] Rash    Family History  Problem Relation Age of Onset  . Depression    . Cancer       Prior to Admission medications   Medication Sig Start Date End Date Taking? Authorizing Provider  albuterol (PROVENTIL HFA;VENTOLIN HFA) 108 (90 BASE) MCG/ACT inhaler Inhale 4 puffs into the lungs every 6 (six) hours as needed. For shortness of breath    Historical Provider, MD  COD LIVER OIL PO Take 2 capsules by mouth daily.    Historical Provider, MD  diphenhydrAMINE (BENADRYL) 25 MG tablet Take 1 tablet (25 mg total) by mouth every 6 (six) hours as needed. 09/09/13   Nishant Dhungel, MD  Emollient (NIVEA) cream Apply 1 application topically daily. Apply all over body    Historical Provider, MD  EPINEPHrine (EPIPEN) 0.3 mg/0.3 mL DEVI Inject 0.3 mg into the muscle as needed. For allergic reaction    Historical Provider, MD  ferrous sulfate 325 (65 FE) MG tablet Take 325-650 mg by mouth daily with breakfast.    Historical Provider, MD  folic acid (FOLVITE) 701 MCG tablet Take 400 mcg by mouth daily.    Historical Provider, MD  Garlic Oil 7793 MG CAPS Take 1,000 mg by mouth daily.    Historical Provider, MD  lidocaine (XYLOCAINE) 4 % external solution Apply 5 mLs topically 4 (four) times daily. 10/15/13   Leana Gamer, MD  Multiple Vitamin (MULTIVITAMIN WITH MINERALS) TABS Take 1 tablet by mouth daily. 1 gummy    Historical Provider, MD  Multiple Vitamins-Minerals (ZINC PO) Take 1 tablet by mouth daily.    Historical Provider, MD  nystatin cream (MYCOSTATIN) Apply 1 application topically 2 (two) times daily. 09/27/13   Leana Gamer, MD  Omega-3 Fatty Acids (FISH OIL) 500 MG CAPS Take 1,000 mg by mouth daily.     Historical Provider, MD  oxyCODONE (OXY IR/ROXICODONE) 5 MG immediate release tablet Take 1 tablet (5 mg total) by mouth every 4 (four) hours as needed for severe pain. 12/06/13   Leana Gamer, MD  predniSONE (DELTASONE) 1 MG tablet 60 mg po daily x 1, then 50 mg po daily x 1, then 40 mg po x 1 then, 30 mg po x 1 then, 20 mg po x 1, then 10 mg po x 1 then stop 12/06/13   Leana Gamer, MD  pyridostigmine (MESTINON) 60 MG tablet Take 1 tablet (60 mg total) by mouth 4 (four) times daily. 10/05/13   Marcial Pacas, MD     Physical Exam: Filed Vitals:   12/20/13 1335 12/20/13 1336 12/20/13 1348 12/20/13 1451  BP:  173/87 173/87   Pulse:  97 111   Temp:  98.9 F (37.2 C) 98.8 F (37.1 C)   TempSrc:  Oral Oral   Resp:  18 20   Height:    5\' 6"  (1.676 m)  Weight:    85.276 kg (188 lb)  SpO2: 97% 97% 100%      Constitutional: Vital signs reviewed. Patient is a well-developed and well-nourished in no  acute distress and cooperative with exam. Alert and oriented x3.  Head: Normocephalic and atraumatic  Ear: TM normal bilaterally  Mouth: no erythema or exudates, MMM  Eyes: PERRL, EOMI, conjunctivae normal, No scleral icterus.  Neck: Supple, Trachea midline normal ROM, No JVD, mass, thyromegaly, or carotid bruit present.  Cardiovascular: RRR, S1 normal, S2 normal, no MRG, pulses symmetric and intact bilaterally  Pulmonary/Chest: CTAB, no wheezes, rales, or rhonchi  Abdominal: Soft. Non-tender, non-distended, bowel sounds are normal, no masses, organomegaly, or guarding present.  GU: no CVA tenderness Musculoskeletal: No joint deformities, erythema, or stiffness, ROM full and no nontender Ext: no edema and no cyanosis, pulses palpable bilaterally (DP and PT)  Hematology: no cervical, inginal, or axillary adenopathy.  Neurological: A&O x3, Strenght is normal and symmetric bilaterally, cranial nerve II-XII are grossly intact, no focal motor deficit, sensory intact to light touch  bilaterally.  Skin: Negative sacral decubitus ulcer identified Bilateral tib-fib circumferentially noted to have open wounds with positive drainage of clear fluid with a strong odor and erythema. Variations in color noted - red, white, pink. Swelling noted to the lower extremities bilaterally. Negative bleeding. Maggots identified on wounds of all different stages of development. Old bandages at here to the skin, when peeled away sloughing of the skin identified. Psychiatric: Normal mood and affect. speech and behavior is normal. Judgment and thought content normal. Cognition and memory are normal.       Labs on Admission:    Basic Metabolic Panel:  Recent Labs Lab 12/20/13 1402  NA 138  K 3.7  CL 101  CO2 22  GLUCOSE 106*  BUN 14  CREATININE 0.81  CALCIUM 9.6   Liver Function Tests:  Recent Labs Lab 12/20/13 1402  AST 27  ALT 32  ALKPHOS 79  BILITOT 0.6  PROT 7.7  ALBUMIN 2.8*   No results for input(s): LIPASE, AMYLASE in the last 168 hours. No results for input(s): AMMONIA in the last 168 hours. CBC:  Recent Labs Lab 12/20/13 1402  WBC 12.2*  NEUTROABS 10.5*  HGB 8.4*  HCT 27.6*  MCV 64.6*  PLT 336   Cardiac Enzymes: No results for input(s): CKTOTAL, CKMB, CKMBINDEX, TROPONINI in the last 168 hours.  BNP (last 3 results) No results for input(s): PROBNP in the last 8760 hours.    CBG: No results for input(s): GLUCAP in the last 168 hours.  Radiological Exams on Admission: Dg Pelvis 1-2 Views  12/20/2013   CLINICAL DATA:  53 year old female with history of trauma from a fall 10:30 this morning with injury to the buttocks, with small skin tear on the left buttocks.  EXAM: PELVIS - 1-2 VIEW  COMPARISON:  No priors.  FINDINGS: There is no evidence of pelvic fracture or diastasis. No pelvic bone lesions are seen.  IMPRESSION: Negative.   Electronically Signed   By: Vinnie Langton M.D.   On: 12/20/2013 15:22   Dg Tibia/fibula Left  12/20/2013    CLINICAL DATA:  Pt fell at 10:30 this AM, landed on buttocks, per EMS small skin tear left buttocks  EXAM: LEFT TIBIA AND FIBULA - 2 VIEW  COMPARISON:  Radiograph 08/26/2010.  FINDINGS: There is periosteal thickening in the distal diaphysis of the tibia and fibula. These findings are similar to comparison exam. No evidence of acute fracture. There is no subcutaneous gas. There are a calcifications within the scan which are new.  IMPRESSION: 1. Periosteal thickening of the distal tibia and fibula. Cannot exclude chronic osteomyelitis. 2. New skin  calcifications. 3. No evidence of fracture.   Electronically Signed   By: Suzy Bouchard M.D.   On: 12/20/2013 15:28   Dg Tibia/fibula Right  12/20/2013   CLINICAL DATA:  Pt fell at 10:30 this AM, landed on buttocks, per EMS small skin tear left buttocks. Leg wounds observed with maggots and adhered bandages to skin. EMS states majority of bandages removed, however some were attached to skin. Bilateral lower leg wounds, pain, redness, and swelling.Low back pain. Best obtainable images due to pt condition.  EXAM: RIGHT TIBIA AND FIBULA - 2 VIEW  COMPARISON:  08/26/2010  FINDINGS: There is degenerative fusion of the right knee. No evidence for acute fracture. No radiopaque foreign body or soft tissue gas. There is irregularity of the soft tissues of the calf.  IMPRESSION: 1. Degenerative fusion of the knee. 2. Irregularity of the soft tissues.   Electronically Signed   By: Shon Hale M.D.   On: 12/20/2013 15:28    EKG: Independently reviewed.  Assessment/Plan Active Problems:   Cellulitis   Cellulitis and abscess of leg  Bilateral lower extremity cellulitis Chronic venous stasis ulcers Skin infestation with maggots Wound care consultation Started the patient on IV vancomycin and Zosyn Infection control paced to be notified Blood culture 2 Plain films negative for osteomyelitis No obvious fracture from the fall Patient will probably need chronic wound  care and long-term placement  Anemia of chronic disease Hemoglobin has been drifting down Will check FOBT  History of myasthenia gravis Continue mestinon  Hypertension Will start the patient on IV hydralazine as needed  Sinus Tachycardia likely secondary to bilateral lower extremity cellulitis start the patient on gentle IV hydration   Code Status:   full Family Communication: bedside Disposition Plan: admit   Time spent: 70 mins   [facility] Pager [phone]  If 7PM-7AM, please contact night-coverage [email or web address] Password [email or web address] 12/20/2013, 3:59 PM

## 2013-12-20 NOTE — Progress Notes (Signed)
Pt has 5 areas to buttock area, 2 large skin tears which i put allevyn on, and the other 3 areas are difficult to place dsg, encouraged pt to turn to keep pressure off.  BLE with multiple stages of wounds and maggots, too painful to touch per pt.  Waiting on [place] for treatment plan

## 2013-12-21 ENCOUNTER — Encounter: Payer: Self-pay | Admitting: Internal Medicine

## 2013-12-21 DIAGNOSIS — L03125 Acute lymphangitis of right lower limb: Secondary | ICD-10-CM | POA: Diagnosis not present

## 2013-12-21 DIAGNOSIS — D638 Anemia in other chronic diseases classified elsewhere: Secondary | ICD-10-CM | POA: Diagnosis not present

## 2013-12-21 DIAGNOSIS — L03126 Acute lymphangitis of left lower limb: Secondary | ICD-10-CM | POA: Diagnosis not present

## 2013-12-21 DIAGNOSIS — I10 Essential (primary) hypertension: Secondary | ICD-10-CM | POA: Diagnosis not present

## 2013-12-21 DIAGNOSIS — L02416 Cutaneous abscess of left lower limb: Secondary | ICD-10-CM | POA: Diagnosis not present

## 2013-12-21 DIAGNOSIS — L02415 Cutaneous abscess of right lower limb: Secondary | ICD-10-CM | POA: Diagnosis not present

## 2013-12-21 DIAGNOSIS — G7000 Myasthenia gravis without (acute) exacerbation: Secondary | ICD-10-CM | POA: Diagnosis not present

## 2013-12-21 LAB — CBC
HEMATOCRIT: 22.7 % — AB (ref 36.0–46.0)
HEMOGLOBIN: 7 g/dL — AB (ref 12.0–15.0)
MCH: 19.9 pg — ABNORMAL LOW (ref 26.0–34.0)
MCHC: 30.8 g/dL (ref 30.0–36.0)
MCV: 64.5 fL — ABNORMAL LOW (ref 78.0–100.0)
Platelets: 313 10*3/uL (ref 150–400)
RBC: 3.52 MIL/uL — ABNORMAL LOW (ref 3.87–5.11)
RDW: 22.4 % — AB (ref 11.5–15.5)
WBC: 9.3 10*3/uL (ref 4.0–10.5)

## 2013-12-21 LAB — COMPREHENSIVE METABOLIC PANEL
ALBUMIN: 1.9 g/dL — AB (ref 3.5–5.2)
ALT: 21 U/L (ref 0–35)
AST: 19 U/L (ref 0–37)
Alkaline Phosphatase: 60 U/L (ref 39–117)
Anion gap: 11 (ref 5–15)
BUN: 15 mg/dL (ref 6–23)
CO2: 25 mEq/L (ref 19–32)
CREATININE: 0.97 mg/dL (ref 0.50–1.10)
Calcium: 8.3 mg/dL — ABNORMAL LOW (ref 8.4–10.5)
Chloride: 101 mEq/L (ref 96–112)
GFR calc Af Amer: 75 mL/min — ABNORMAL LOW (ref >90)
GFR calc non Af Amer: 65 mL/min — ABNORMAL LOW (ref >90)
Glucose, Bld: 121 mg/dL — ABNORMAL HIGH (ref 70–99)
Potassium: 3.8 mEq/L (ref 3.7–5.3)
Sodium: 137 mEq/L (ref 137–147)
TOTAL PROTEIN: 5.3 g/dL — AB (ref 6.0–8.3)
Total Bilirubin: 0.4 mg/dL (ref 0.3–1.2)

## 2013-12-21 LAB — VANCOMYCIN, TROUGH: Vancomycin Tr: 23.9 ug/mL — ABNORMAL HIGH (ref 10.0–20.0)

## 2013-12-21 LAB — PREPARE RBC (CROSSMATCH)

## 2013-12-21 LAB — TSH: TSH: 0.918 u[IU]/mL (ref 0.350–4.500)

## 2013-12-21 LAB — HEMOGLOBIN A1C
HEMOGLOBIN A1C: 6 % — AB (ref <5.7)
Mean Plasma Glucose: 126 mg/dL — ABNORMAL HIGH (ref <117)

## 2013-12-21 MED ORDER — HYDROCORTISONE 1 % EX CREA
TOPICAL_CREAM | Freq: Two times a day (BID) | CUTANEOUS | Status: DC
  Administered 2013-12-21 – 2013-12-22 (×2): via TOPICAL
  Administered 2013-12-22: 1 via TOPICAL
  Administered 2013-12-23 – 2013-12-24 (×3): via TOPICAL
  Filled 2013-12-21 (×2): qty 28

## 2013-12-21 MED ORDER — PIPERACILLIN-TAZOBACTAM 3.375 G IVPB 30 MIN
3.3750 g | Freq: Once | INTRAVENOUS | Status: AC
  Administered 2013-12-21: 3.375 g via INTRAVENOUS
  Filled 2013-12-21: qty 50

## 2013-12-21 MED ORDER — VANCOMYCIN HCL IN DEXTROSE 750-5 MG/150ML-% IV SOLN
750.0000 mg | Freq: Two times a day (BID) | INTRAVENOUS | Status: DC
  Administered 2013-12-22 – 2013-12-23 (×3): 750 mg via INTRAVENOUS
  Filled 2013-12-21 (×4): qty 150

## 2013-12-21 MED ORDER — MORPHINE SULFATE 2 MG/ML IJ SOLN
1.0000 mg | INTRAMUSCULAR | Status: DC | PRN
  Administered 2013-12-21: 2 mg via INTRAVENOUS
  Administered 2013-12-24: 3 mg via INTRAVENOUS
  Filled 2013-12-21 (×2): qty 2
  Filled 2013-12-21: qty 1

## 2013-12-21 MED ORDER — HYDROCHLOROTHIAZIDE 25 MG PO TABS
25.0000 mg | ORAL_TABLET | Freq: Every day | ORAL | Status: DC
  Administered 2013-12-21 – 2013-12-24 (×4): 25 mg via ORAL
  Filled 2013-12-21 (×4): qty 1

## 2013-12-21 MED ORDER — HYDROXYZINE HCL 25 MG PO TABS
25.0000 mg | ORAL_TABLET | ORAL | Status: DC | PRN
  Administered 2013-12-23 (×4): 25 mg via ORAL
  Filled 2013-12-21 (×6): qty 1

## 2013-12-21 MED ORDER — SODIUM CHLORIDE 0.9 % IV SOLN
Freq: Once | INTRAVENOUS | Status: AC
  Administered 2013-12-21: 22:00:00 via INTRAVENOUS

## 2013-12-21 NOTE — Plan of Care (Signed)
Problem: Phase I Progression Outcomes Goal: Pain controlled with appropriate interventions Outcome: Completed/Met Date Met:  12/21/13 Goal: OOB as tolerated unless otherwise ordered Outcome: Not Progressing Goal: Hemodynamically stable Outcome: Completed/Met Date Met:  12/21/13 Goal: Temperature < 102 Outcome: Completed/Met Date Met:  12/21/13 Goal: Wound assessment- dressing change as appropriate Outcome: Completed/Met Date Met:  12/21/13

## 2013-12-21 NOTE — Progress Notes (Addendum)
[facility] History and Physical  [name] DSK:[id number] DOB: [birth year] DOA: 12/20/2013  Referring physician: PCP: MATTHEWS,MICHELLE A., MD     Assessment and plan Bilateral lower extremity cellulitis Chronic venous stasis ulcers Skin infestation with maggots Wound care consultation Continue on IV vancomycin and Zosyn Follow Blood culture 2 Plain films negative for osteomyelitis No obvious fracture from the fall Patient will probably need chronic wound care and long-term placement Hydroxyzine for itching Morphine for dressing changes   Anemia of chronic disease Hemoglobin has been drifting down Hemoglobin 7.0, transfuse 2 units of packed red blood cells FOBT  History of myasthenia gravis Continue mestinon  Hypertension Will start the patient on IV hydralazine as needed, start the patient on hydrochlorothiazide  Sinus Tachycardia likely secondary to bilateral lower extremity cellulitis start the patient on gentle IV hydration   Code Status:   full Family Communication: bedside Disposition Plan: PT/OT eval   HPI:  53 year old female with a history of myasthenia gravis, chronic venous stasis ulcers, history of DVT hypertension presents to the ED after a fall.Patient reported that she had a fall earlier this morning, 10:30 AM. Patient states that she lost her balance and fell. Patient reported that she has pain in her back and buttocks as well, reported that she was told by EMS that she has skin tears present. Patient reported that she has history of venous stasis - stated that she is supposed to get her wounds cleaned and bandages changed every 6 hours, she has [place] home health and follows with [facility] - she did not receive her supplies for 2-1/2 weeks and therefore wound care stopped coming in.  Patient reported that she has noticed increased swelling, erythema, and drainage. Stated that the smell of her wounds has increased tremendously.  She occasionally feels biting into her skin. Denied fever, chills, neck pain, neck stiffness, head injury, loss of conscious, visual changes, chest pain/shortness of breath/difficulty breathing pre and post fall, numbness, tingling, loss of sensation.  In the ED the patient was found to have Extensive circumferential wounds to lower legs bilaterally. Weak pulses in both feet, feet cool to touch. Able to move all toes. Different sized maggots in wounds on both legs. Wound base tissue pink/red/white, wet, occasional gray patch.     Social History:     Allergies  Allergen Reactions  . Asa [Aspirin] Shortness Of Breath, Rash and Anaphylaxis  . Cat Hair Extract Anaphylaxis  . Dilaudid [Hydromorphone Hcl] Shortness Of Breath and Palpitations  . Tylenol [Acetaminophen] Rash, Other (See Comments) and Anaphylaxis    Other reaction(s): SHORTNESS OF BREATH Salivation and hearing loss  . Ciprofloxacin     Other reaction(s): OTHER  . Dicloxacillin     Other reaction(s): OTHER  . Erythromycin Other (See Comments)    Burn veins  . Hydromorphone     Other reaction(s): OTHER  . Indomethacin Other (See Comments)    Other reaction(s): OTHER Stroke symptoms  . Naproxen Other (See Comments)    Other reaction(s): OTHER Renal failure  . Tramadol     "stroke"  . Alginate-Collagen [Wound Dressings] Rash  . Gold-Containing Drug Products Rash  . Silvadene [Silver Sulfadiazine] Rash    Family History  Problem Relation Age of Onset  . Depression    . Cancer       Prior to Admission medications   Medication Sig Start Date End Date Taking? Authorizing Provider  albuterol (PROVENTIL HFA;VENTOLIN HFA) 108 (90 BASE) MCG/ACT inhaler Inhale 4 puffs into  the lungs every 6 (six) hours as needed. For shortness of breath    Historical Provider, MD  COD LIVER OIL PO Take 2 capsules by mouth daily.    Historical Provider, MD  diphenhydrAMINE (BENADRYL) 25 MG tablet Take 1 tablet (25 mg total) by mouth every 6  (six) hours as needed. 09/09/13   Nishant Dhungel, MD  Emollient (NIVEA) cream Apply 1 application topically daily. Apply all over body    Historical Provider, MD  EPINEPHrine (EPIPEN) 0.3 mg/0.3 mL DEVI Inject 0.3 mg into the muscle as needed. For allergic reaction    Historical Provider, MD  ferrous sulfate 325 (65 FE) MG tablet Take 325-650 mg by mouth daily with breakfast.    Historical Provider, MD  folic acid (FOLVITE) 237 MCG tablet Take 400 mcg by mouth daily.    Historical Provider, MD  Garlic Oil 6283 MG CAPS Take 1,000 mg by mouth daily.    Historical Provider, MD  lidocaine (XYLOCAINE) 4 % external solution Apply 5 mLs topically 4 (four) times daily. 10/15/13   Leana Gamer, MD  Multiple Vitamin (MULTIVITAMIN WITH MINERALS) TABS Take 1 tablet by mouth daily. 1 gummy    Historical Provider, MD  Multiple Vitamins-Minerals (ZINC PO) Take 1 tablet by mouth daily.    Historical Provider, MD  nystatin cream (MYCOSTATIN) Apply 1 application topically 2 (two) times daily. 09/27/13   Leana Gamer, MD  Omega-3 Fatty Acids (FISH OIL) 500 MG CAPS Take 1,000 mg by mouth daily.    Historical Provider, MD  oxyCODONE (OXY IR/ROXICODONE) 5 MG immediate release tablet Take 1 tablet (5 mg total) by mouth every 4 (four) hours as needed for severe pain. 12/06/13   Leana Gamer, MD  predniSONE (DELTASONE) 1 MG tablet 60 mg po daily x 1, then 50 mg po daily x 1, then 40 mg po x 1 then, 30 mg po x 1 then, 20 mg po x 1, then 10 mg po x 1 then stop 12/06/13   Leana Gamer, MD  pyridostigmine (MESTINON) 60 MG tablet Take 1 tablet (60 mg total) by mouth 4 (four) times daily. 10/05/13   Marcial Pacas, MD     Physical Exam: Filed Vitals:   12/20/13 1638 12/20/13 1648 12/20/13 2345 12/21/13 0622  BP: 185/62 182/67 111/49 132/61  Pulse:  107 113 104  Temp:   99.1 F (37.3 C) 98.2 F (36.8 C)  TempSrc:   Oral Oral  Resp:  15 16 16   Height:      Weight:      SpO2:  100% 97% 98%      Constitutional: Vital signs reviewed. Patient is a well-developed and well-nourished in no acute distress and cooperative with exam. Alert and oriented x3.  Head: Normocephalic and atraumatic  Ear: TM normal bilaterally  Mouth: no erythema or exudates, MMM  Eyes: PERRL, EOMI, conjunctivae normal, No scleral icterus.  Neck: Supple, Trachea midline normal ROM, No JVD, mass, thyromegaly, or carotid bruit present.  Cardiovascular: RRR, S1 normal, S2 normal, no MRG, pulses symmetric and intact bilaterally  Pulmonary/Chest: CTAB, no wheezes, rales, or rhonchi  Abdominal: Soft. Non-tender, non-distended, bowel sounds are normal, no masses, organomegaly, or guarding present.  GU: no CVA tenderness Musculoskeletal: No joint deformities, erythema, or stiffness, ROM full and no nontender Ext: no edema and no cyanosis, pulses palpable bilaterally (DP and PT)  Hematology: no cervical, inginal, or axillary adenopathy.  Neurological: A&O x3, Strenght is normal and symmetric bilaterally, cranial nerve II-XII  are grossly intact, no focal motor deficit, sensory intact to light touch bilaterally.  Skin: Negative sacral decubitus ulcer identified Bilateral tib-fib circumferentially noted to have open wounds with positive drainage of clear fluid with a strong odor and erythema. Variations in color noted - red, white, pink. Swelling noted to the lower extremities bilaterally. Negative bleeding. Maggots identified on wounds of all different stages of development. Old bandages at here to the skin, when peeled away sloughing of the skin identified. Psychiatric: Normal mood and affect. speech and behavior is normal. Judgment and thought content normal. Cognition and memory are normal.       Labs on Admission:    Basic Metabolic Panel:  Recent Labs Lab 12/20/13 1402 12/20/13 1559 12/21/13 0408  NA 138  --  137  K 3.7  --  3.8  CL 101  --  101  CO2 22  --  25  GLUCOSE 106*  --  121*  BUN 14  --  15   CREATININE 0.81  --  0.97  CALCIUM 9.6  --  8.3*  MG  --  2.1  --    Liver Function Tests:  Recent Labs Lab 12/20/13 1402 12/21/13 0408  AST 27 19  ALT 32 21  ALKPHOS 79 60  BILITOT 0.6 0.4  PROT 7.7 5.3*  ALBUMIN 2.8* 1.9*   No results for input(s): LIPASE, AMYLASE in the last 168 hours. No results for input(s): AMMONIA in the last 168 hours. CBC:  Recent Labs Lab 12/20/13 1402 12/21/13 0408  WBC 12.2* 9.3  NEUTROABS 10.5*  --   HGB 8.4* 7.0*  HCT 27.6* 22.7*  MCV 64.6* 64.5*  PLT 336 313   Cardiac Enzymes: No results for input(s): CKTOTAL, CKMB, CKMBINDEX, TROPONINI in the last 168 hours.  BNP (last 3 results) No results for input(s): PROBNP in the last 8760 hours.    CBG: No results for input(s): GLUCAP in the last 168 hours.  Radiological Exams on Admission: Dg Lumbar Spine Complete  12/20/2013   CLINICAL DATA:  The patient fell earlier today  EXAM: LUMBAR SPINE - COMPLETE 4+ VIEW  COMPARISON:  None.  FINDINGS: Frontal, lateral, spot lumbosacral lateral, and bilateral oblique views were obtained. There are 5 non-rib-bearing lumbar type vertebral bodies. There is no fracture or spondylolisthesis. There is moderate disc space narrowing at L5-S1. There is slight disc space narrowing at L3-4 and L4-5. There is facet osteoarthritic change at L4-5 and L5-S1 bilaterally. No erosive change.  IMPRESSION: Areas of osteoarthritic change.  No fracture or spondylolisthesis.   Electronically Signed   By: Lowella Grip M.D.   On: 12/20/2013 16:22   Dg Pelvis 1-2 Views  12/20/2013   CLINICAL DATA:  53 year old female with history of trauma from a fall 10:30 this morning with injury to the buttocks, with small skin tear on the left buttocks.  EXAM: PELVIS - 1-2 VIEW  COMPARISON:  No priors.  FINDINGS: There is no evidence of pelvic fracture or diastasis. No pelvic bone lesions are seen.  IMPRESSION: Negative.   Electronically Signed   By: Vinnie Langton M.D.   On:  12/20/2013 15:22   Dg Tibia/fibula Left  12/20/2013   CLINICAL DATA:  Pt fell at 10:30 this AM, landed on buttocks, per EMS small skin tear left buttocks  EXAM: LEFT TIBIA AND FIBULA - 2 VIEW  COMPARISON:  Radiograph 08/26/2010.  FINDINGS: There is periosteal thickening in the distal diaphysis of the tibia and fibula. These findings are similar to  comparison exam. No evidence of acute fracture. There is no subcutaneous gas. There are a calcifications within the scan which are new.  IMPRESSION: 1. Periosteal thickening of the distal tibia and fibula. Cannot exclude chronic osteomyelitis. 2. New skin calcifications. 3. No evidence of fracture.   Electronically Signed   By: Suzy Bouchard M.D.   On: 12/20/2013 15:28   Dg Tibia/fibula Right  12/20/2013   CLINICAL DATA:  Pt fell at 10:30 this AM, landed on buttocks, per EMS small skin tear left buttocks. Leg wounds observed with maggots and adhered bandages to skin. EMS states majority of bandages removed, however some were attached to skin. Bilateral lower leg wounds, pain, redness, and swelling.Low back pain. Best obtainable images due to pt condition.  EXAM: RIGHT TIBIA AND FIBULA - 2 VIEW  COMPARISON:  08/26/2010  FINDINGS: There is degenerative fusion of the right knee. No evidence for acute fracture. No radiopaque foreign body or soft tissue gas. There is irregularity of the soft tissues of the calf.  IMPRESSION: 1. Degenerative fusion of the knee. 2. Irregularity of the soft tissues.   Electronically Signed   By: Shon Hale M.D.   On: 12/20/2013 15:28    EKG: Independently reviewed.  Assessment/Plan Active Problems:   Cellulitis   Cellulitis and abscess of leg   Time spent: 70 mins   [facility] Pager [phone]  If 7PM-7AM, please contact night-coverage [email or web address] Password [facility] 12/21/2013, 10:37 AM

## 2013-12-21 NOTE — Progress Notes (Signed)
VASCULAR LAB PRELIMINARY  PRELIMINARY  PRELIMINARY  PRELIMINARY  Unable to perform ABIs due to the fact that patient cannot tolerate the pressure from the cuffs or to be touched due to pain at this time.  Please advise if patient can tolerate in the future.  Eloina Ergle, RVS 12/21/2013, 3:24 PM

## 2013-12-21 NOTE — Progress Notes (Signed)
ANTIBIOTIC CONSULT NOTE - FOLLOW UP  Pharmacy Consult for Vancomycin/Zosyn Indication: wound infection  Allergies  Allergen Reactions  . Asa [Aspirin] Shortness Of Breath, Rash and Anaphylaxis  . Cat Hair Extract Anaphylaxis  . Dilaudid [Hydromorphone Hcl] Shortness Of Breath and Palpitations  . Tylenol [Acetaminophen] Rash, Other (See Comments) and Anaphylaxis    Other reaction(s): SHORTNESS OF BREATH Salivation and hearing loss  . Ciprofloxacin     Other reaction(s): OTHER  . Dicloxacillin     Other reaction(s): OTHER  . Erythromycin Other (See Comments)    Burn veins  . Hydromorphone     Other reaction(s): OTHER  . Indomethacin Other (See Comments)    Other reaction(s): OTHER Stroke symptoms  . Naproxen Other (See Comments)    Other reaction(s): OTHER Renal failure  . Tramadol     "stroke"  . Alginate-Collagen [Wound Dressings] Rash  . Gold-Containing Drug Products Rash  . Silvadene [Silver Sulfadiazine] Rash    Patient Measurements: Height: 5\' 6"  (167.6 cm) Weight: 188 lb (85.276 kg) IBW/kg (Calculated) : 59.3  Vital Signs: Temp: 99.7 F (37.6 C) (11/27 1537) Temp Source: Oral (11/27 1537) BP: 139/87 mmHg (11/27 1537) Pulse Rate: 108 (11/27 1537) Intake/Output from previous day: 11/26 0701 - 11/27 0700 In: 1125 [I.V.:825; IV Piggyback:300] Out: -  Intake/Output from this shift: Total I/O In: 480 [P.O.:480] Out: -   Labs:  Recent Labs  12/20/13 1402 12/21/13 0408  WBC 12.2* 9.3  HGB 8.4* 7.0*  PLT 336 313  CREATININE 0.81 0.97   Estimated Creatinine Clearance: 73 mL/min (by C-G formula based on Cr of 0.97).  Recent Labs  12/21/13 1505  VANCOTROUGH 23.9*     Assessment: 27 yoF with PMH of myasthenia gravis, chronic venous stasis ulcers, h/o DVT, HTN presenting to [facility] ED on 11/26 with skin tear on buttocks after a fall at home. She also has chronic venous statis and bilateral circumferential open wound with strong odor and maggots in various  stages of development. She states home health nurses were supposed to assist with wound bandage changes, but have not come in about 2 weeks. Pharmacy is consulted to dose Vancomycin, Zosyn for wound infection.  11/26 >> Vanc >> 11/26 >> Zosyn >>   Tmax: 99.1 WBCs: 12.2 > 9.3 Renal: SCr 0.97, CrCl ~88 ml/in (CG), ~74 ml/min (normalized) Lactic acid 1.19  11/26 blood x2: IP 11/26 left leg wound cx: ngtd  Drug level / dose changes info: 11/27 1505 VT: 23.71mcg/ml on 1g q8h (prior to 4th dose, no LD)  Day is day #2 Zosyn 3.375g IV q8h (4 hour infusion time) and Vancomycin 1g q8h for wound infection.  Plain films negative for osteomyelitis.WOC to evaluate.  Goal of Therapy:  Vancomycin trough level 15-20 mcg/ml Doses adjusted per renal function Eradication of infection  Plan:  1.  Vancomycin trough elevated following 3 doses of vancomycin 1gm IV q8h, change to 750mg  IV q12h, delay next dose until 11/28 am to allow level to come down, est half-life = ~12h (est new trough = 15.81mcg/ml) 2.  Continue Zosyn 3.375g IV q8h (4 hour infusion time). 3.  F/u culture results.  Doreene Eland, PharmD, BCPS.   Pager: [phone]  12/21/2013,4:21 PM

## 2013-12-21 NOTE — Progress Notes (Signed)
ANTIBIOTIC CONSULT NOTE - FOLLOW UP  Pharmacy Consult for Vancomycin/Zosyn Indication: wound infection  Allergies  Allergen Reactions  . Asa [Aspirin] Shortness Of Breath, Rash and Anaphylaxis  . Cat Hair Extract Anaphylaxis  . Dilaudid [Hydromorphone Hcl] Shortness Of Breath and Palpitations  . Tylenol [Acetaminophen] Rash, Other (See Comments) and Anaphylaxis    Other reaction(s): SHORTNESS OF BREATH Salivation and hearing loss  . Ciprofloxacin     Other reaction(s): OTHER  . Dicloxacillin     Other reaction(s): OTHER  . Erythromycin Other (See Comments)    Burn veins  . Hydromorphone     Other reaction(s): OTHER  . Indomethacin Other (See Comments)    Other reaction(s): OTHER Stroke symptoms  . Naproxen Other (See Comments)    Other reaction(s): OTHER Renal failure  . Tramadol     "stroke"  . Alginate-Collagen [Wound Dressings] Rash  . Gold-Containing Drug Products Rash  . Silvadene [Silver Sulfadiazine] Rash    Patient Measurements: Height: 5\' 6"  (167.6 cm) Weight: 188 lb (85.276 kg) IBW/kg (Calculated) : 59.3  Vital Signs: Temp: 98.2 F (36.8 C) (11/27 0622) Temp Source: Oral (11/27 0622) BP: 132/61 mmHg (11/27 0622) Pulse Rate: 104 (11/27 0622) Intake/Output from previous day: 11/26 0701 - 11/27 0700 In: 1125 [I.V.:825; IV Piggyback:300] Out: -  Intake/Output from this shift:    Labs:  Recent Labs  12/20/13 1402 12/21/13 0408  WBC 12.2* 9.3  HGB 8.4* 7.0*  PLT 336 313  CREATININE 0.81 0.97   Estimated Creatinine Clearance: 73 mL/min (by C-G formula based on Cr of 0.97). No results for input(s): VANCOTROUGH, VANCOPEAK, VANCORANDOM, GENTTROUGH, GENTPEAK, GENTRANDOM, TOBRATROUGH, TOBRAPEAK, TOBRARND, AMIKACINPEAK, AMIKACINTROU, AMIKACIN in the last 72 hours.     Assessment: 80 yoF with PMH of myasthenia gravis, chronic venous stasis ulcers, h/o DVT, HTN presenting to [facility] ED on 11/26 with skin tear on buttocks after a fall at home. She also has  chronic venous statis and bilateral circumferential open wound with strong odor and maggots in various stages of development. She states home health nurses were supposed to assist with wound bandage changes, but have not come in about 2 weeks. Pharmacy is consulted to dose Vancomycin, Zosyn for wound infection.  11/26 >> Vanc >> 11/26 >> Zosyn >>   Tmax: 99.1 WBCs: 12.2 > 9.3 Renal: SCr 0.97, CrCl ~88 ml/in (CG), ~74 ml/min (normalized) Lactic acid 1.19  11/26 blood x2: IP 11/26 left leg wound cx: ngtd  Day is day #2 Zosyn 3.375g IV q8h (4 hour infusion time) and Vancomycin 1g q8h for wound infection.  Plain films negative for osteomyelitis.WOC to evaluate.  Goal of Therapy:  Vancomycin trough level 15-20 mcg/ml Doses adjusted per renal function Eradication of infection  Plan:  1.  Check vancomycin trough this afternoon. 2.  Continue Zosyn 3.375g IV q8h (4 hour infusion time). 3.  F/u culture results.  Hershal Coria 12/21/2013,8:40 AM

## 2013-12-21 NOTE — Consult Note (Signed)
WOC wound consult note Reason for Consult:Bilateral LE ulcerations with larval presence. Right heel with two areas of unblanchable erythema (Stage I) PrU.  Patient has long history of treatment at both [facility] and [facility] as well as [facility].  Today she tells me that she is being followed by the outpatient wound care center at [facility]).  She has seen plastics (Dr. Theodoro Kos) in the past.  [facility] at last admission determined that there was not role for their service at that time.  She was last seen by me during a [facility] admission on 09/04/13.  Wound type:Infectious, chronic venous insufficiency Pressure Ulcer POA: Yes Measurement:Right lateral LE:  10cm x 5cm x 0.5cm.  Right posterior LE:  5cm x 4cm x 0.5cm.  Left LE (near) circumferential ulceration:  15cm x 26cm x 0.4cm with central necrotic area measuring 4cm x 3cm (debrided) and 3cm x 2cm with larval infestation (removed) Wound bed: Both wounds are 75% pale pink and very moist, 20% yellow finbrinous material and 5% necrotic eschar.  Identified areas of necrotic tissue are debrided via conservative sharp methods today. Drainage (amount, consistency, odor) Moderate amounts of light yellow exudate with strong odor initially, but improved after cleansing and topic care. Periwound:As described above. Periwound skin is protected with clear, zinc based skin protectant (Critic Aid Clear) Dressing procedure/placement/frequency:Patient was premedicated with 2mg  morphine IV. LEs cleansed and patted gently dry.  Ulcers covered with xeroform gauze and a calcium alginate (this is an atypical use of this dressing, but patient cannot tolerate any adherence of the calcium alginate to her wound.  This technique allows for moisture management.  The left LE then requires padding with two ABD pads (one placed anteriorly and the other posteriorly prior to wrapping both with Kerl;ix gauze roll wrap from toe to knee.  This is topped with 4-inch ACE wrap from  toe to knee. We will change these three times weekly for now, M-W-F.  I have ordered bilateral Prevalon Boots to bloat the heels in an attempt to resolve the  Stage I PrUs on the right heel.  I have also ordered a trapeze to see if this will assist in patient mobility while in bed. Patient may benefit from consultation with Plastics while here.  If you agree, please order. I will defer to their expertise in wound care if that is the case. Conservative sharp wound debridement (CSWD performed at the bedside): Yes.  Two areas of eschar on left LE and several (<20) larvae removed. Larvae placed into a urine specimen cup and the top secured prior to disposal into a biohazard receptacle. [facility] nursing team will follow, and will remain available to this patient, the nursing and medical teams.   Thanks, Maudie Flakes, MSN, RN, [place], [place], [place] [phone])

## 2013-12-21 NOTE — Progress Notes (Signed)
Spoke with Butch Penny, RN in house rep with [facility] concerning [facility]. Pt was noncomplaint; supplies delivered to pt's home x2, each delivery was confirmed.  Pt told nurse from [place] that the supplies did not come. Pt would not allow nurses in on 11/23 or 11/25. [facility] will not take this pt back into their services.

## 2013-12-22 DIAGNOSIS — L03125 Acute lymphangitis of right lower limb: Secondary | ICD-10-CM | POA: Diagnosis not present

## 2013-12-22 DIAGNOSIS — L02415 Cutaneous abscess of right lower limb: Secondary | ICD-10-CM | POA: Diagnosis not present

## 2013-12-22 DIAGNOSIS — L02416 Cutaneous abscess of left lower limb: Secondary | ICD-10-CM | POA: Diagnosis not present

## 2013-12-22 DIAGNOSIS — G7000 Myasthenia gravis without (acute) exacerbation: Secondary | ICD-10-CM | POA: Diagnosis not present

## 2013-12-22 DIAGNOSIS — D638 Anemia in other chronic diseases classified elsewhere: Secondary | ICD-10-CM | POA: Diagnosis not present

## 2013-12-22 DIAGNOSIS — L03126 Acute lymphangitis of left lower limb: Secondary | ICD-10-CM | POA: Diagnosis not present

## 2013-12-22 DIAGNOSIS — I10 Essential (primary) hypertension: Secondary | ICD-10-CM | POA: Diagnosis not present

## 2013-12-22 LAB — TYPE AND SCREEN
ABO/RH(D): AB POS
Antibody Screen: NEGATIVE
UNIT DIVISION: 0
Unit division: 0

## 2013-12-22 LAB — CBC
HCT: 27 % — ABNORMAL LOW (ref 36.0–46.0)
HEMOGLOBIN: 8.4 g/dL — AB (ref 12.0–15.0)
MCH: 21.2 pg — AB (ref 26.0–34.0)
MCHC: 31.1 g/dL (ref 30.0–36.0)
MCV: 68.2 fL — ABNORMAL LOW (ref 78.0–100.0)
Platelets: 267 10*3/uL (ref 150–400)
RBC: 3.96 MIL/uL (ref 3.87–5.11)
RDW: 24.2 % — ABNORMAL HIGH (ref 11.5–15.5)
WBC: 11.8 10*3/uL — ABNORMAL HIGH (ref 4.0–10.5)

## 2013-12-22 NOTE — Evaluation (Signed)
Physical Therapy Evaluation Patient Details Name: [name] MRN: [id number] DOB: 19-Apr-1960 Today's Date: 12/22/2013   History of Present Illness  53 year old female with a history of myasthenia gravis,  RA, chronic venous stasis ulcers, history of DVT hypertension presents to the ED after a fall. Pt was receiving home health nursing for wound care however her supplies were not delivered by [employer] and she was without wound supplies for 2 1/2 weeks. Upon admission wounds were found to be malodorous and infested with larvae.    Clinical Impression  *Pt admitted with fall with skin tears to buttocks, BLE venous stasis wounds*. Pt currently with functional limitations due to the deficits listed below (see PT Problem List).  Pt will benefit from skilled PT to increase their independence and safety with mobility to allow discharge to the venue listed below.    Pt requires assist for getting out of bed to a recliner. She stated she'd like to DC home, however PT is strongly recommending SNF as she does not have a caregiver available and requires assist for mobility. Pt is agreeable to SW looking into SNF options. She couldn't tolerate walking due to BLE pain despite having pain medication prior to PT session. Pt reported her WC at home is broken and she would like assistance with replacing it, she'd prefer a motorized scooter.     *    Follow Up Recommendations SNF    Equipment Recommendations  Wheelchair (measurements PT);Wheelchair cushion (measurements PT);Other (comment) (pt requests trapeze for her hospital bed, her WC is broken, she'd like a motorized scooter)    Recommendations for Other Services OT consult     Precautions / Restrictions Precautions Precautions: Fall Precaution Comments: pt reports only one fall, which occurred just PTA, in past year; skin tears on buttocks Restrictions Weight Bearing Restrictions: Yes Other Position/Activity Restrictions: minimize pressure to  heels, stage 1 pressure ulcer R heel      Mobility  Bed Mobility Overal bed mobility: Needs Assistance Bed Mobility: Supine to Sit     Supine to sit: Mod assist     General bed mobility comments: mod A to pivot hips to EOB, used pad to minimize shear to skin tears on buttocks  Transfers Overall transfer level: Needs assistance Equipment used: Rolling walker (2 wheeled) Transfers: Sit to/from [facility] Sit to Stand: From elevated surface;+2 safety/equipment;Min assist Stand pivot transfers: Min assist;+2 safety/equipment       General transfer comment: assist to rise from elevated bed, +2 for safety 2* recent fall  Ambulation/Gait Ambulation/Gait assistance: +2 safety/equipment;Min assist Ambulation Distance (Feet): 3 Feet Assistive device: Rolling walker (2 wheeled) Gait Pattern/deviations: Shuffle;Trunk flexed   Gait velocity interpretation: Below normal speed for age/gender General Gait Details: pt took several pivotal, shuffling steps from bed to recliner, distance limited by BLE pain  Stairs            Wheelchair Mobility    Modified Rankin (Stroke Patients Only)       Balance Overall balance assessment: Needs assistance   Sitting balance-Leahy Scale: Good       Standing balance-Leahy Scale: Poor Standing balance comment: requires BUE support                             Pertinent Vitals/Pain Pain Assessment: 0-10 Pain Score: 7  Pain Location: B lower legs at wound sites Pain Descriptors / Indicators: Sore Pain Intervention(s): Monitored during session;Limited activity within patient's tolerance;Premedicated  before session;Repositioned    Home Living Family/patient expects to be discharged to:: Private residence Living Arrangements: Alone   Type of Home: Apartment Home Access: Stairs to enter Entrance Stairs-Rails: None Entrance Stairs-Number of Steps: 2 Home Layout: One level Home Equipment: [place] - 2  wheels;Bedside commode;Hospital bed Additional Comments: pt stated she has a broken WC and needs a new one, would like a scooter    Prior Function Level of Independence: Independent with assistive device(s);Needs assistance   Gait / Transfers Assistance Needed: pt stated she uses RW as needed  ADL's / Homemaking Assistance Needed: sponge bathes at sink independently  Comments: [facility] for transportation, had home health wound care     Hand Dominance        Extremity/Trunk Assessment   Upper Extremity Assessment: Defer to OT evaluation           Lower Extremity Assessment: RLE deficits/detail RLE Deficits / Details: 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, L knee extension -4/5    Cervical / Trunk Assessment: Kyphotic  Communication   Communication: No difficulties  Cognition Arousal/Alertness: Awake/alert Behavior During Therapy: WFL for tasks assessed/performed Overall Cognitive Status: Within Functional Limits for tasks assessed                      General Comments      Exercises        Assessment/Plan    PT Assessment Patient needs continued PT services  PT Diagnosis Difficulty walking;Acute pain;Generalized weakness   PT Problem List Decreased activity tolerance;Pain;Decreased balance;Decreased mobility;Decreased safety awareness  PT Treatment Interventions Gait training;Functional mobility training;Therapeutic activities;Patient/family education;Therapeutic exercise;Balance training   PT Goals (Current goals can be found in the Care Plan section) Acute Rehab PT Goals Patient Stated Goal: to return home PT Goal Formulation: With patient Time For Goal Achievement: 01/05/14 Potential to Achieve Goals: Fair    Frequency Min 3X/week   Barriers to discharge Decreased caregiver support pt lives alone, no caregiver available    Co-evaluation               End of Session Equipment Utilized During Treatment: Gait belt Activity Tolerance:  Patient limited by pain Patient left: in chair;with call bell/phone within reach Nurse Communication: Mobility status         Time: 0957-1020 PT Time Calculation (min) (ACUTE ONLY): 23 min   Charges:   PT Evaluation $Initial PT Evaluation Tier I: 1 Procedure PT Treatments $Therapeutic Activity: 23-37 mins   PT G Codes:          Philomena Doheny 12/22/2013, 10:31 AM [phone]

## 2013-12-22 NOTE — Progress Notes (Addendum)
[facility] History and Physical  [name] ZWC:[id number] DOB: Jul 15, 1960 DOA: 12/20/2013  Referring physician: PCP: MATTHEWS,MICHELLE A., MD     Assessment and plan Bilateral lower extremity cellulitis Chronic venous stasis ulcers Skin infestation with maggots Wound care consultation Continue on IV vancomycin , discontinue Zosyn Follow Blood culture 2 Plain films negative for osteomyelitis No obvious fracture from the fall Patient will probably need chronic wound care and long-term placement Hydroxyzine for itching Morphine for dressing changes Unable to do ABI because of intolerance to the pressure from the cuff  Anemia of chronic disease Hemoglobin has been drifting down Hemoglobin 7.0, transfuse 2 units of packed red blood cells FOBTpending  History of myasthenia gravis Continue mestinon  Hypertension Will start the patient on IV hydralazine as needed, start the patient on hydrochlorothiazide  Sinus Tachycardia likely secondary to bilateral lower extremity cellulitis start the patient on gentle IV hydration   Code Status:   full Family Communication: bedside Disposition Plan: PT/OT eval recommended SNF, social work consult   HPI:  53 year old female with a history of myasthenia gravis, chronic venous stasis ulcers, history of DVT hypertension presents to the ED after a fall.Patient reported that she had a fall earlier this morning, 10:30 AM. Patient states that she lost her balance and fell. Patient reported that she has pain in her back and buttocks as well, reported that she was told by EMS that she has skin tears present. Patient reported that she has history of venous stasis - stated that she is supposed to get her wounds cleaned and bandages changed every 6 hours, she has [place] home health and follows with [facility] - she did not receive her supplies for 2-1/2 weeks and therefore wound care stopped coming in.  Patient reported that  she has noticed increased swelling, erythema, and drainage. Stated that the smell of her wounds has increased tremendously. She occasionally feels biting into her skin. Denied fever, chills, neck pain, neck stiffness, head injury, loss of conscious, visual changes, chest pain/shortness of breath/difficulty breathing pre and post fall, numbness, tingling, loss of sensation.  In the ED the patient was found to have Extensive circumferential wounds to lower legs bilaterally. Weak pulses in both feet, feet cool to touch. Able to move all toes. Different sized maggots in wounds on both legs. Wound base tissue pink/red/white, wet, occasional gray patch.     Social History:     Allergies  Allergen Reactions  . Asa [Aspirin] Shortness Of Breath, Rash and Anaphylaxis  . Cat Hair Extract Anaphylaxis  . Dilaudid [Hydromorphone Hcl] Shortness Of Breath and Palpitations  . Tylenol [Acetaminophen] Rash, Other (See Comments) and Anaphylaxis    Other reaction(s): SHORTNESS OF BREATH Salivation and hearing loss  . Ciprofloxacin     Other reaction(s): OTHER  . Dicloxacillin     Other reaction(s): OTHER  . Erythromycin Other (See Comments)    Burn veins  . Hydromorphone     Other reaction(s): OTHER  . Indomethacin Other (See Comments)    Other reaction(s): OTHER Stroke symptoms  . Naproxen Other (See Comments)    Other reaction(s): OTHER Renal failure  . Tramadol     "stroke"  . Alginate-Collagen [Wound Dressings] Rash  . Gold-Containing Drug Products Rash  . Silvadene [Silver Sulfadiazine] Rash    Family History  Problem Relation Age of Onset  . Depression    . Cancer       Prior to Admission medications   Medication Sig Start Date End  Date Taking? Authorizing Provider  albuterol (PROVENTIL HFA;VENTOLIN HFA) 108 (90 BASE) MCG/ACT inhaler Inhale 4 puffs into the lungs every 6 (six) hours as needed. For shortness of breath    Historical Provider, MD  COD LIVER OIL PO Take 2 capsules by  mouth daily.    Historical Provider, MD  diphenhydrAMINE (BENADRYL) 25 MG tablet Take 1 tablet (25 mg total) by mouth every 6 (six) hours as needed. 09/09/13   Nishant Dhungel, MD  Emollient (NIVEA) cream Apply 1 application topically daily. Apply all over body    Historical Provider, MD  EPINEPHrine (EPIPEN) 0.3 mg/0.3 mL DEVI Inject 0.3 mg into the muscle as needed. For allergic reaction    Historical Provider, MD  ferrous sulfate 325 (65 FE) MG tablet Take 325-650 mg by mouth daily with breakfast.    Historical Provider, MD  folic acid (FOLVITE) 568 MCG tablet Take 400 mcg by mouth daily.    Historical Provider, MD  Garlic Oil 1275 MG CAPS Take 1,000 mg by mouth daily.    Historical Provider, MD  lidocaine (XYLOCAINE) 4 % external solution Apply 5 mLs topically 4 (four) times daily. 10/15/13   Leana Gamer, MD  Multiple Vitamin (MULTIVITAMIN WITH MINERALS) TABS Take 1 tablet by mouth daily. 1 gummy    Historical Provider, MD  Multiple Vitamins-Minerals (ZINC PO) Take 1 tablet by mouth daily.    Historical Provider, MD  nystatin cream (MYCOSTATIN) Apply 1 application topically 2 (two) times daily. 09/27/13   Leana Gamer, MD  Omega-3 Fatty Acids (FISH OIL) 500 MG CAPS Take 1,000 mg by mouth daily.    Historical Provider, MD  oxyCODONE (OXY IR/ROXICODONE) 5 MG immediate release tablet Take 1 tablet (5 mg total) by mouth every 4 (four) hours as needed for severe pain. 12/06/13   Leana Gamer, MD  predniSONE (DELTASONE) 1 MG tablet 60 mg po daily x 1, then 50 mg po daily x 1, then 40 mg po x 1 then, 30 mg po x 1 then, 20 mg po x 1, then 10 mg po x 1 then stop 12/06/13   Leana Gamer, MD  pyridostigmine (MESTINON) 60 MG tablet Take 1 tablet (60 mg total) by mouth 4 (four) times daily. 10/05/13   Marcial Pacas, MD     Physical Exam: Filed Vitals:   12/21/13 2200 12/21/13 2225 12/22/13 0022 12/22/13 0530  BP: 113/51 119/59 116/61 133/69  Pulse: 102 104 95 87  Temp: 98.9 F (37.2  C) 99 F (37.2 C) 98.7 F (37.1 C) 98.2 F (36.8 C)  TempSrc: Oral Oral Oral Oral  Resp: 18 16 16 16   Height:      Weight:      SpO2: 99% 99% 100% 97%     Constitutional: Vital signs reviewed. Patient is a well-developed and well-nourished in no acute distress and cooperative with exam. Alert and oriented x3.  Head: Normocephalic and atraumatic  Ear: TM normal bilaterally  Mouth: no erythema or exudates, MMM  Eyes: PERRL, EOMI, conjunctivae normal, No scleral icterus.  Neck: Supple, Trachea midline normal ROM, No JVD, mass, thyromegaly, or carotid bruit present.  Cardiovascular: RRR, S1 normal, S2 normal, no MRG, pulses symmetric and intact bilaterally  Pulmonary/Chest: CTAB, no wheezes, rales, or rhonchi  Abdominal: Soft. Non-tender, non-distended, bowel sounds are normal, no masses, organomegaly, or guarding present.  GU: no CVA tenderness Musculoskeletal: No joint deformities, erythema, or stiffness, ROM full and no nontender Ext: no edema and no cyanosis, pulses palpable bilaterally (  DP and PT)  Hematology: no cervical, inginal, or axillary adenopathy.  Neurological: A&O x3, Strenght is normal and symmetric bilaterally, cranial nerve II-XII are grossly intact, no focal motor deficit, sensory intact to light touch bilaterally.  Skin: Negative sacral decubitus ulcer identified Bilateral tib-fib circumferentially noted to have open wounds with positive drainage of clear fluid with a strong odor and erythema. Variations in color noted - red, white, pink. Swelling noted to the lower extremities bilaterally. Negative bleeding. Maggots identified on wounds of all different stages of development. Old bandages at here to the skin, when peeled away sloughing of the skin identified. Psychiatric: Normal mood and affect. speech and behavior is normal. Judgment and thought content normal. Cognition and memory are normal.       Labs on Admission:    Basic Metabolic Panel:  Recent Labs Lab  12/20/13 1402 12/20/13 1559 12/21/13 0408  NA 138  --  137  K 3.7  --  3.8  CL 101  --  101  CO2 22  --  25  GLUCOSE 106*  --  121*  BUN 14  --  15  CREATININE 0.81  --  0.97  CALCIUM 9.6  --  8.3*  MG  --  2.1  --    Liver Function Tests:  Recent Labs Lab 12/20/13 1402 12/21/13 0408  AST 27 19  ALT 32 21  ALKPHOS 79 60  BILITOT 0.6 0.4  PROT 7.7 5.3*  ALBUMIN 2.8* 1.9*   No results for input(s): LIPASE, AMYLASE in the last 168 hours. No results for input(s): AMMONIA in the last 168 hours. CBC:  Recent Labs Lab 12/20/13 1402 12/21/13 0408 12/22/13 0433  WBC 12.2* 9.3 11.8*  NEUTROABS 10.5*  --   --   HGB 8.4* 7.0* 8.4*  HCT 27.6* 22.7* 27.0*  MCV 64.6* 64.5* 68.2*  PLT 336 313 267   Cardiac Enzymes: No results for input(s): CKTOTAL, CKMB, CKMBINDEX, TROPONINI in the last 168 hours.  BNP (last 3 results) No results for input(s): PROBNP in the last 8760 hours.    CBG: No results for input(s): GLUCAP in the last 168 hours.  Radiological Exams on Admission: Dg Lumbar Spine Complete  12/20/2013   CLINICAL DATA:  The patient fell earlier today  EXAM: LUMBAR SPINE - COMPLETE 4+ VIEW  COMPARISON:  None.  FINDINGS: Frontal, lateral, spot lumbosacral lateral, and bilateral oblique views were obtained. There are 5 non-rib-bearing lumbar type vertebral bodies. There is no fracture or spondylolisthesis. There is moderate disc space narrowing at L5-S1. There is slight disc space narrowing at L3-4 and L4-5. There is facet osteoarthritic change at L4-5 and L5-S1 bilaterally. No erosive change.  IMPRESSION: Areas of osteoarthritic change.  No fracture or spondylolisthesis.   Electronically Signed   By: Lowella Grip M.D.   On: 12/20/2013 16:22   Dg Pelvis 1-2 Views  12/20/2013   CLINICAL DATA:  53 year old female with history of trauma from a fall 10:30 this morning with injury to the buttocks, with small skin tear on the left buttocks.  EXAM: PELVIS - 1-2 VIEW   COMPARISON:  No priors.  FINDINGS: There is no evidence of pelvic fracture or diastasis. No pelvic bone lesions are seen.  IMPRESSION: Negative.   Electronically Signed   By: Vinnie Langton M.D.   On: 12/20/2013 15:22   Dg Tibia/fibula Left  12/20/2013   CLINICAL DATA:  Pt fell at 10:30 this AM, landed on buttocks, per EMS small skin tear left buttocks  EXAM: LEFT TIBIA AND FIBULA - 2 VIEW  COMPARISON:  Radiograph 08/26/2010.  FINDINGS: There is periosteal thickening in the distal diaphysis of the tibia and fibula. These findings are similar to comparison exam. No evidence of acute fracture. There is no subcutaneous gas. There are a calcifications within the scan which are new.  IMPRESSION: 1. Periosteal thickening of the distal tibia and fibula. Cannot exclude chronic osteomyelitis. 2. New skin calcifications. 3. No evidence of fracture.   Electronically Signed   By: Suzy Bouchard M.D.   On: 12/20/2013 15:28   Dg Tibia/fibula Right  12/20/2013   CLINICAL DATA:  Pt fell at 10:30 this AM, landed on buttocks, per EMS small skin tear left buttocks. Leg wounds observed with maggots and adhered bandages to skin. EMS states majority of bandages removed, however some were attached to skin. Bilateral lower leg wounds, pain, redness, and swelling.Low back pain. Best obtainable images due to pt condition.  EXAM: RIGHT TIBIA AND FIBULA - 2 VIEW  COMPARISON:  08/26/2010  FINDINGS: There is degenerative fusion of the right knee. No evidence for acute fracture. No radiopaque foreign body or soft tissue gas. There is irregularity of the soft tissues of the calf.  IMPRESSION: 1. Degenerative fusion of the knee. 2. Irregularity of the soft tissues.   Electronically Signed   By: Shon Hale M.D.   On: 12/20/2013 15:28    EKG: Independently reviewed.  Assessment/Plan Active Problems:   Cellulitis   Cellulitis and abscess of leg    Time spent: 70 mins   [facility] Pager [phone]  If  7PM-7AM, please contact night-coverage [email or web address] Password [email or web address] 12/22/2013, 11:19 AM

## 2013-12-22 NOTE — Evaluation (Signed)
Occupational Therapy Evaluation Patient Details Name: [name] MRN: [id number] DOB: 08-Jul-1960 Today's Date: 12/22/2013    History of Present Illness 53 year old female with a history of myasthenia gravis,  RA, chronic venous stasis ulcers, history of DVT hypertension presents to the ED after a fall. Pt was receiving home health nursing per [facility] note she was non compliant- Upon admission wounds were found to be malodorous and infested with larvae.     Clinical Impression   Pt admitted with infected wounds. Pt currently with functional limitations due to the deficits listed below (see OT Problem List).  Pt will benefit from skilled OT to increase their safety and independence with ADL and functional mobility for ADL to facilitate discharge to venue listed below.      Follow Up Recommendations  SNF    Equipment Recommendations  None recommended by OT    Recommendations for Other Services       Precautions / Restrictions Precautions Precautions: Fall Precaution Comments: pt reports only one fall, which occurred just PTA, in past year; skin tears on buttocks Restrictions Weight Bearing Restrictions: Yes Other Position/Activity Restrictions: minimize pressure to heels, stage 1 pressure ulcer R heel      Mobility Bed Mobility Overal bed mobility: Needs Assistance Bed Mobility: Supine to Sit     Supine to sit: Mod assist     General bed mobility comments: pt in chair  Transfers Overall transfer level: Needs assistance Equipment used: Rolling walker (2 wheeled) Transfers: Sit to/from Stand (pt did not completely stand) Sit to Stand: From elevated surface;+2 safety/equipment;Min assist Stand pivot transfers: Min assist;+2 safety/equipment       General transfer comment: pt refused to stand - but did lift bottom up enough to reposition and provide pressure relief    Balance Overall balance assessment: Needs assistance   Sitting balance-Leahy Scale: Good        Standing balance-Leahy Scale: Poor Standing balance comment: requires BUE support                            ADL Overall ADL's : Needs assistance/impaired Eating/Feeding: Set up;Sitting   Grooming: Minimal assistance;Sitting   Upper Body Bathing: Minimal assitance;Sitting   Lower Body Bathing: Sitting/lateral leans;Maximal assistance   Upper Body Dressing : Minimal assistance;Sitting   Lower Body Dressing: Sitting/lateral leans;Maximal assistance                                 Pertinent Vitals/Pain Pain Assessment: 0-10 Pain Score: 6  Pain Location: blilateral lower legs Pain Descriptors / Indicators: Sore Pain Intervention(s): Monitored during session;Repositioned;Limited activity within patient's tolerance     Hand Dominance     Extremity/Trunk Assessment Upper Extremity Assessment Upper Extremity Assessment: Generalized weakness   Lower Extremity Assessment Lower Extremity Assessment: RLE deficits/detail RLE Deficits / Details: 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, L knee extension -4/5 RLE: Unable to fully assess due to pain   Cervical / Trunk Assessment Cervical / Trunk Assessment: Kyphotic   Communication Communication Communication: No difficulties   Cognition Arousal/Alertness: Awake/alert Behavior During Therapy: WFL for tasks assessed/performed Overall Cognitive Status: Within Functional Limits for tasks assessed                                Home Living Family/patient expects to be discharged to:: Private residence Living Arrangements:  Alone   Type of Home: Apartment Home Access: Stairs to enter Entrance Stairs-Number of Steps: 2 Entrance Stairs-Rails: None Home Layout: One level               Home Equipment: Walker - 2 wheels;Bedside commode;Hospital bed   Additional Comments: pt stated she has a broken WC and needs a new one, would like a scooter      Prior Functioning/Environment Level  of Independence: Independent with assistive device(s);Needs assistance  Gait / Transfers Assistance Needed: pt stated she uses RW as needed ADL's / Homemaking Assistance Needed: sponge bathes at sink independently   Comments: [facility] for transportation, had home health wound care    OT Diagnosis: Generalized weakness   OT Problem List: Decreased strength;Decreased activity tolerance;Impaired balance (sitting and/or standing);Pain   OT Treatment/Interventions: Self-care/ADL training;DME and/or AE instruction;Patient/family education    OT Goals(Current goals can be found in the care plan section) Acute Rehab OT Goals Patient Stated Goal: to return home OT Goal Formulation: With patient Time For Goal Achievement: 01/05/14 Potential to Achieve Goals: Good ADL Goals Pt Will Perform Grooming: with set-up;standing Pt Will Perform Lower Body Dressing: with supervision;sit to/from stand Pt Will Transfer to Toilet: with supervision;bedside commode Pt Will Perform Toileting - Clothing Manipulation and hygiene: with supervision;sit to/from stand;sitting/lateral leans  OT Frequency: Min 2X/week              End of Session Nurse Communication: Mobility status  Activity Tolerance: Patient limited by pain Patient left: in chair;with call bell/phone within reach   Time: 1130-1143 OT Time Calculation (min): 13 min Charges:  OT General Charges $OT Visit: 1 Procedure OT Evaluation $Initial OT Evaluation Tier I: 1 Procedure G-Codes:    Betsy Pries 25-Dec-2013, 11:46 AM

## 2013-12-23 DIAGNOSIS — I10 Essential (primary) hypertension: Secondary | ICD-10-CM | POA: Diagnosis not present

## 2013-12-23 DIAGNOSIS — L03125 Acute lymphangitis of right lower limb: Secondary | ICD-10-CM | POA: Diagnosis not present

## 2013-12-23 DIAGNOSIS — L03126 Acute lymphangitis of left lower limb: Secondary | ICD-10-CM | POA: Diagnosis not present

## 2013-12-23 DIAGNOSIS — D638 Anemia in other chronic diseases classified elsewhere: Secondary | ICD-10-CM | POA: Diagnosis not present

## 2013-12-23 DIAGNOSIS — G7000 Myasthenia gravis without (acute) exacerbation: Secondary | ICD-10-CM | POA: Diagnosis not present

## 2013-12-23 DIAGNOSIS — L02416 Cutaneous abscess of left lower limb: Secondary | ICD-10-CM | POA: Diagnosis not present

## 2013-12-23 DIAGNOSIS — L02415 Cutaneous abscess of right lower limb: Secondary | ICD-10-CM | POA: Diagnosis not present

## 2013-12-23 LAB — COMPREHENSIVE METABOLIC PANEL
ALBUMIN: 2 g/dL — AB (ref 3.5–5.2)
ALT: 16 U/L (ref 0–35)
AST: 12 U/L (ref 0–37)
Alkaline Phosphatase: 51 U/L (ref 39–117)
Anion gap: 11 (ref 5–15)
BUN: 13 mg/dL (ref 6–23)
CHLORIDE: 96 meq/L (ref 96–112)
CO2: 28 mEq/L (ref 19–32)
CREATININE: 0.87 mg/dL (ref 0.50–1.10)
Calcium: 8.4 mg/dL (ref 8.4–10.5)
GFR calc Af Amer: 86 mL/min — ABNORMAL LOW (ref >90)
GFR calc non Af Amer: 74 mL/min — ABNORMAL LOW (ref >90)
Glucose, Bld: 120 mg/dL — ABNORMAL HIGH (ref 70–99)
Potassium: 3.8 mEq/L (ref 3.7–5.3)
Sodium: 135 mEq/L — ABNORMAL LOW (ref 137–147)
TOTAL PROTEIN: 5.9 g/dL — AB (ref 6.0–8.3)
Total Bilirubin: 0.3 mg/dL (ref 0.3–1.2)

## 2013-12-23 LAB — CBC
HCT: 28.2 % — ABNORMAL LOW (ref 36.0–46.0)
Hemoglobin: 8.8 g/dL — ABNORMAL LOW (ref 12.0–15.0)
MCH: 21.4 pg — ABNORMAL LOW (ref 26.0–34.0)
MCHC: 31.2 g/dL (ref 30.0–36.0)
MCV: 68.4 fL — AB (ref 78.0–100.0)
PLATELETS: 280 10*3/uL (ref 150–400)
RBC: 4.12 MIL/uL (ref 3.87–5.11)
RDW: 24.5 % — AB (ref 11.5–15.5)
WBC: 10.1 10*3/uL (ref 4.0–10.5)

## 2013-12-23 LAB — WOUND CULTURE
Gram Stain: NONE SEEN
Special Requests: NORMAL

## 2013-12-23 MED ORDER — AMOXICILLIN-POT CLAVULANATE 875-125 MG PO TABS
1.0000 | ORAL_TABLET | Freq: Two times a day (BID) | ORAL | Status: DC
  Administered 2013-12-23 – 2013-12-24 (×3): 1 via ORAL
  Filled 2013-12-23 (×4): qty 1

## 2013-12-23 MED ORDER — DOXYCYCLINE HYCLATE 100 MG PO TABS
100.0000 mg | ORAL_TABLET | Freq: Two times a day (BID) | ORAL | Status: DC
  Administered 2013-12-23 – 2013-12-24 (×3): 100 mg via ORAL
  Filled 2013-12-23 (×4): qty 1

## 2013-12-23 NOTE — Progress Notes (Signed)
[facility] History and Physical  [name] KGM:[id number] DOB: [birth year] DOA: 12/20/2013  Referring physician: PCP: MATTHEWS,MICHELLE A., MD     Assessment and plan Bilateral lower extremity cellulitis Chronic venous stasis ulcers Skin infestation with maggots Wound care consultation, arrange for home health for complex wound care Discontinue vancomycin, Zosyn, switched to Augmentin and doxycycline Follow Blood culture 2, negative so far Plain films negative for osteomyelitis No obvious fracture from the fall Patient will probably need chronic wound care and long-term placement Hydroxyzine for itching Morphine for dressing changes Unable to do ABI because of intolerance to the pressure from the cuff  Anemia of chronic disease Hemoglobin has been drifting down Hemoglobin 7.0 requiring transfusion of 2 units of packed red blood cells,  Stable CBC  History of myasthenia gravis Continue mestinon  Hypertension Will start the patient on IV hydralazine as needed, start the patient on hydrochlorothiazide  Sinus Tachycardia likely secondary to bilateral lower extremity cellulitis start the patient on gentle IV hydration   Code Status:   full Family Communication: bedside Disposition Plan: Anticipate discharge home with home health tomorrow, patient refuses SNF   HPI:  53 year old female with a history of myasthenia gravis, chronic venous stasis ulcers, history of DVT hypertension presents to the ED after a fall.Patient reported that she had a fall earlier this morning, 10:30 AM. Patient states that she lost her balance and fell. Patient reported that she has pain in her back and buttocks as well, reported that she was told by EMS that she has skin tears present. Patient reported that she has history of venous stasis - stated that she is supposed to get her wounds cleaned and bandages changed every 6 hours, she has [place] home health and follows with [facility] - she did not receive her supplies for 2-1/2 weeks and therefore wound care stopped coming in.  Patient reported that she has noticed increased swelling, erythema, and drainage. Stated that the smell of her wounds has increased tremendously. She occasionally feels biting into her skin. Denied fever, chills, neck pain, neck stiffness, head injury, loss of conscious, visual changes, chest pain/shortness of breath/difficulty breathing pre and post fall, numbness, tingling, loss of sensation.  In the ED the patient was found to have Extensive circumferential wounds to lower legs bilaterally. Weak pulses in both feet, feet cool to touch. Able to move all toes. Different sized maggots in wounds on both legs. Wound base tissue pink/red/white, wet, occasional gray patch.     Social History:     Allergies  Allergen Reactions  . Asa [Aspirin] Shortness Of Breath, Rash and Anaphylaxis  . Cat Hair Extract Anaphylaxis  . Dilaudid [Hydromorphone Hcl] Shortness Of Breath and Palpitations  . Tylenol [Acetaminophen] Rash, Other (See Comments) and Anaphylaxis    Other reaction(s): SHORTNESS OF BREATH Salivation and hearing loss  . Ciprofloxacin     Other reaction(s): OTHER  . Dicloxacillin     Other reaction(s): OTHER  . Erythromycin Other (See Comments)    Burn veins  . Hydromorphone     Other reaction(s): OTHER  . Indomethacin Other (See Comments)    Other reaction(s): OTHER Stroke symptoms  . Naproxen Other (See Comments)    Other reaction(s): OTHER Renal failure  . Tramadol     "stroke"  . Alginate-Collagen [Wound Dressings] Rash  . Gold-Containing Drug Products Rash  . Silvadene [Silver Sulfadiazine] Rash    Family History  Problem Relation Age of Onset  . Depression    .  Cancer       Prior to Admission medications   Medication Sig Start Date End Date Taking? Authorizing Provider  albuterol (PROVENTIL HFA;VENTOLIN HFA) 108 (90 BASE) MCG/ACT inhaler Inhale 4 puffs into  the lungs every 6 (six) hours as needed. For shortness of breath    Historical Provider, MD  COD LIVER OIL PO Take 2 capsules by mouth daily.    Historical Provider, MD  diphenhydrAMINE (BENADRYL) 25 MG tablet Take 1 tablet (25 mg total) by mouth every 6 (six) hours as needed. 09/09/13   Nishant Dhungel, MD  Emollient (NIVEA) cream Apply 1 application topically daily. Apply all over body    Historical Provider, MD  EPINEPHrine (EPIPEN) 0.3 mg/0.3 mL DEVI Inject 0.3 mg into the muscle as needed. For allergic reaction    Historical Provider, MD  ferrous sulfate 325 (65 FE) MG tablet Take 325-650 mg by mouth daily with breakfast.    Historical Provider, MD  folic acid (FOLVITE) 559 MCG tablet Take 400 mcg by mouth daily.    Historical Provider, MD  Garlic Oil 7416 MG CAPS Take 1,000 mg by mouth daily.    Historical Provider, MD  lidocaine (XYLOCAINE) 4 % external solution Apply 5 mLs topically 4 (four) times daily. 10/15/13   Leana Gamer, MD  Multiple Vitamin (MULTIVITAMIN WITH MINERALS) TABS Take 1 tablet by mouth daily. 1 gummy    Historical Provider, MD  Multiple Vitamins-Minerals (ZINC PO) Take 1 tablet by mouth daily.    Historical Provider, MD  nystatin cream (MYCOSTATIN) Apply 1 application topically 2 (two) times daily. 09/27/13   Leana Gamer, MD  Omega-3 Fatty Acids (FISH OIL) 500 MG CAPS Take 1,000 mg by mouth daily.    Historical Provider, MD  oxyCODONE (OXY IR/ROXICODONE) 5 MG immediate release tablet Take 1 tablet (5 mg total) by mouth every 4 (four) hours as needed for severe pain. 12/06/13   Leana Gamer, MD  predniSONE (DELTASONE) 1 MG tablet 60 mg po daily x 1, then 50 mg po daily x 1, then 40 mg po x 1 then, 30 mg po x 1 then, 20 mg po x 1, then 10 mg po x 1 then stop 12/06/13   Leana Gamer, MD  pyridostigmine (MESTINON) 60 MG tablet Take 1 tablet (60 mg total) by mouth 4 (four) times daily. 10/05/13   Marcial Pacas, MD     Physical Exam: Filed Vitals:    12/22/13 0530 12/22/13 1309 12/22/13 2313 12/23/13 0611  BP: 133/69 137/78 139/79 150/71  Pulse: 87 78 94 81  Temp: 98.2 F (36.8 C) 98 F (36.7 C) 98.8 F (37.1 C) 98.1 F (36.7 C)  TempSrc: Oral Oral Oral Oral  Resp: 16 18 20 20   Height:      Weight:      SpO2: 97% 98% 98% 98%     Constitutional: Vital signs reviewed. Patient is a well-developed and well-nourished in no acute distress and cooperative with exam. Alert and oriented x3.  Head: Normocephalic and atraumatic  Ear: TM normal bilaterally  Mouth: no erythema or exudates, MMM  Eyes: PERRL, EOMI, conjunctivae normal, No scleral icterus.  Neck: Supple, Trachea midline normal ROM, No JVD, mass, thyromegaly, or carotid bruit present.  Cardiovascular: RRR, S1 normal, S2 normal, no MRG, pulses symmetric and intact bilaterally  Pulmonary/Chest: CTAB, no wheezes, rales, or rhonchi  Abdominal: Soft. Non-tender, non-distended, bowel sounds are normal, no masses, organomegaly, or guarding present.  GU: no CVA tenderness Musculoskeletal: Both lower  extremities currently covered with dressing Hematology: no cervical, inginal, or axillary adenopathy.  Neurological: A&O x3, Strenght is normal and symmetric bilaterally, cranial nerve II-XII are grossly intact, no focal motor deficit, sensory intact to light touch bilaterally.  Skin: Both extremities currently covered with a dressing  Psychiatric: Normal mood and affect. speech and behavior is normal. Judgment and thought content normal. Cognition and memory are normal.       Labs on Admission:    Basic Metabolic Panel:  Recent Labs Lab 12/20/13 1402 12/20/13 1559 12/21/13 0408  NA 138  --  137  K 3.7  --  3.8  CL 101  --  101  CO2 22  --  25  GLUCOSE 106*  --  121*  BUN 14  --  15  CREATININE 0.81  --  0.97  CALCIUM 9.6  --  8.3*  MG  --  2.1  --    Liver Function Tests:  Recent Labs Lab 12/20/13 1402 12/21/13 0408  AST 27 19  ALT 32 21  ALKPHOS 79 60  BILITOT  0.6 0.4  PROT 7.7 5.3*  ALBUMIN 2.8* 1.9*   No results for input(s): LIPASE, AMYLASE in the last 168 hours. No results for input(s): AMMONIA in the last 168 hours. CBC:  Recent Labs Lab 12/20/13 1402 12/21/13 0408 12/22/13 0433 12/23/13 0439  WBC 12.2* 9.3 11.8* 10.1  NEUTROABS 10.5*  --   --   --   HGB 8.4* 7.0* 8.4* 8.8*  HCT 27.6* 22.7* 27.0* 28.2*  MCV 64.6* 64.5* 68.2* 68.4*  PLT 336 313 267 280   Cardiac Enzymes: No results for input(s): CKTOTAL, CKMB, CKMBINDEX, TROPONINI in the last 168 hours.  BNP (last 3 results) No results for input(s): PROBNP in the last 8760 hours.    CBG: No results for input(s): GLUCAP in the last 168 hours.  Radiological Exams on Admission: No results found.  EKG: Independently reviewed.  Assessment/Plan Active Problems:   Cellulitis   Cellulitis and abscess of leg    Time spent: 70 mins   [facility] Pager [phone]  If 7PM-7AM, please contact night-coverage [email or web address] Password [email or web address] 12/23/2013, 11:41 AM

## 2013-12-23 NOTE — Plan of Care (Signed)
Problem: Phase I Progression Outcomes Goal: OOB as tolerated unless otherwise ordered Outcome: Completed/Met Date Met:  12/23/13     

## 2013-12-23 NOTE — Plan of Care (Signed)
Problem: Phase III Progression Outcomes Goal: Temperature < 100 Outcome: Completed/Met Date Met:  12/23/13

## 2013-12-24 ENCOUNTER — Telehealth: Payer: Self-pay | Admitting: Internal Medicine

## 2013-12-24 DIAGNOSIS — L03125 Acute lymphangitis of right lower limb: Secondary | ICD-10-CM | POA: Diagnosis not present

## 2013-12-24 DIAGNOSIS — L02415 Cutaneous abscess of right lower limb: Secondary | ICD-10-CM | POA: Diagnosis not present

## 2013-12-24 DIAGNOSIS — G7000 Myasthenia gravis without (acute) exacerbation: Secondary | ICD-10-CM | POA: Diagnosis not present

## 2013-12-24 DIAGNOSIS — D638 Anemia in other chronic diseases classified elsewhere: Secondary | ICD-10-CM | POA: Diagnosis not present

## 2013-12-24 DIAGNOSIS — B999 Unspecified infectious disease: Secondary | ICD-10-CM | POA: Diagnosis not present

## 2013-12-24 DIAGNOSIS — M79606 Pain in leg, unspecified: Secondary | ICD-10-CM | POA: Diagnosis not present

## 2013-12-24 DIAGNOSIS — I10 Essential (primary) hypertension: Secondary | ICD-10-CM | POA: Diagnosis not present

## 2013-12-24 DIAGNOSIS — L03126 Acute lymphangitis of left lower limb: Secondary | ICD-10-CM | POA: Diagnosis not present

## 2013-12-24 DIAGNOSIS — L02416 Cutaneous abscess of left lower limb: Secondary | ICD-10-CM | POA: Diagnosis not present

## 2013-12-24 MED ORDER — AMOXICILLIN-POT CLAVULANATE 875-125 MG PO TABS: 1.0000 | ORAL_TABLET | Freq: Two times a day (BID) | ORAL | Status: AC

## 2013-12-24 MED ORDER — DOXYCYCLINE HYCLATE 100 MG PO TABS: 100.0000 mg | ORAL_TABLET | Freq: Two times a day (BID) | ORAL | Status: AC

## 2013-12-24 MED ORDER — HYDROCHLOROTHIAZIDE 25 MG PO TABS: 25.0000 mg | ORAL_TABLET | Freq: Every day | ORAL | Status: DC

## 2013-12-24 MED ORDER — HYDROCORTISONE 1 % EX CREA: TOPICAL_CREAM | Freq: Two times a day (BID) | CUTANEOUS | Status: AC

## 2013-12-24 MED ORDER — HYDROXYZINE HCL 25 MG PO TABS: 25.0000 mg | ORAL_TABLET | ORAL | Status: AC | PRN

## 2013-12-24 MED ORDER — OXYCODONE HCL 5 MG PO TABS: 5.0000 mg | ORAL_TABLET | ORAL | Status: DC | PRN

## 2013-12-24 NOTE — Progress Notes (Signed)
Dressing to BLE  And sacral dressings done prior to d/c home. D/c instructions and follow up appointments done was discussed and was given to the patient verbalized understanding. PIV removed by NT no s/s of infiltration or swelling noted.

## 2013-12-24 NOTE — Care Management Note (Signed)
    Page 1 of 2   12/24/2013     2:09:01 PM CARE MANAGEMENT NOTE 12/24/2013  Patient:  [name],[name]   Account Number:  [id number]  Date Initiated:  12/21/2013  Documentation initiated by:  Karl Bales  Subjective/Objective Assessment:   PT ADMITTED WITH CELLULITIS     Action/Plan:   FROM HOME   Anticipated DC Date:  12/24/2013   Anticipated DC Plan:  [place]  In-house referral  Clinical Social Worker      DC Planning Services  CM consult      Choice offered to / List presented to:  C-1 Patient   DME arranged  [place]      DME agency  [place] arranged  HH-1 RN  [place].   Status of service:  Completed, signed off Medicare Important Message given?  YES (If response is "NO", the following Medicare IM given date fields will be blank) Date Medicare IM given:  12/24/2013 Medicare IM given by:  [facility] Date Additional Medicare IM given:   Additional Medicare IM given by:    Discharge Disposition:  [place]  Per UR Regulation:  Reviewed for med. necessity/level of care/duration of stay  If discussed at [place] of Stay Meetings, dates discussed:    Comments:  12/24/13 [place] Lingenfelter RN,BSN NCM [phone] PT-SNF.PATIENT DECLINES SNF.[facility] UNABLE TO ACCEPT PATIENT BACK TO THEIR SERVICE-SPOKE TO [facility] REP.[facility] CHOSEN FOR HHC,[facility] REP AWARE OF D/C & HHC ORDERS.[facility] REP [facility]-AWARE OF DME ORDERS.SHE WILL MAKE ARRANGEMENTS FOR DELIVERY TO PATIENT'S HOME W/PATIENT.AMBULANCE TRANSP.FOR HOME-CSW NOTIFIED.  12/21/13 MMcGibboney, RN, BSN Spoke with Butch Penny, RN in house rep with [facility]. Pt was noncomplaint; supplies delivered to pt's home x2, each delivery was confirmed.  Pt told nurse from [place] that the supplies did not come. Pt would not allow nurses in on 11/23 or 11/25.  [facility] will not take this pt back into their services.

## 2013-12-24 NOTE — Plan of Care (Signed)
Problem: Phase III Progression Outcomes Goal: Activity at appropriate level-compared to baseline (UP IN CHAIR FOR HEMODIALYSIS)  Outcome: Completed/Met Date Met:  12/24/13 Frequent turning

## 2013-12-24 NOTE — Discharge Summary (Addendum)
Physician Discharge Summary  [name] MRN: [id number] DOB/AGE: 1960/10/28 53 y.o.  PCP: MATTHEWS,MICHELLE A., MD   Admit date: 12/20/2013 Discharge date: 12/24/2013  Discharge Diagnoses:  Complicated bilateral lower extremity wounds   Cellulitis   Cellulitis and abscess of leg Hypertension Anemia of chronic disease Myasthenia gravis    Follow-up recommendations Follow-up with PCP in 5-7 days follow-up BMP CBC in one week       Medication List    TAKE these medications        albuterol 108 (90 BASE) MCG/ACT inhaler  Commonly known as:  PROVENTIL HFA;VENTOLIN HFA  Inhale 4 puffs into the lungs every 6 (six) hours as needed. For shortness of breath     amoxicillin-clavulanate 875-125 MG per tablet  Commonly known as:  AUGMENTIN  Take 1 tablet by mouth 2 (two) times daily.     COD LIVER OIL PO  Take 2 capsules by mouth daily.     diphenhydrAMINE 25 MG tablet  Commonly known as:  BENADRYL  Take 1 tablet (25 mg total) by mouth every 6 (six) hours as needed.     doxycycline 100 MG tablet  Commonly known as:  VIBRA-TABS  Take 1 tablet (100 mg total) by mouth every 12 (twelve) hours.     EPIPEN 0.3 mg/0.3 mL Devi  Generic drug:  EPINEPHrine  Inject 0.3 mg into the muscle as needed. For allergic reaction     ferrous sulfate 325 (65 FE) MG tablet  Take 325 mg by mouth daily with breakfast.     Fish Oil 500 MG Caps  Take 1,000 mg by mouth daily.     folic acid 893 MCG tablet  Commonly known as:  FOLVITE  Take 400 mcg by mouth daily.     Garlic Oil 8101 MG Caps  Take 1,000 mg by mouth daily.     hydrochlorothiazide 25 MG tablet  Commonly known as:  HYDRODIURIL  Take 1 tablet (25 mg total) by mouth daily.     hydrocortisone cream 1 %  Apply topically 2 (two) times daily.     hydrOXYzine 25 MG tablet  Commonly known as:  ATARAX/VISTARIL  Take 1 tablet (25 mg total) by mouth every 4 (four) hours as needed for itching.     lidocaine 4 %  external solution  Commonly known as:  XYLOCAINE  Apply 5 mLs topically 4 (four) times daily.     multivitamin with minerals Tabs tablet  Take 1 tablet by mouth daily. 1 gummy     nivea cream  Apply 1 application topically daily. Apply all over body     nystatin cream  Commonly known as:  MYCOSTATIN  Apply 1 application topically 2 (two) times daily.     oxyCODONE 5 MG immediate release tablet  Commonly known as:  Oxy IR/ROXICODONE  Take 1 tablet (5 mg total) by mouth every 4 (four) hours as needed for severe pain.     pyridostigmine 60 MG tablet  Commonly known as:  MESTINON  Take 1 tablet (60 mg total) by mouth 4 (four) times daily.     ZINC PO  Take 1 tablet by mouth daily.        Discharge Condition: Stable   Disposition: 06-Home-Health Care Svc   Consults:     Significant Diagnostic Studies: Dg Lumbar Spine Complete  12/20/2013   CLINICAL DATA:  The patient fell earlier today  EXAM: LUMBAR SPINE - COMPLETE 4+ VIEW  COMPARISON:  None.  FINDINGS: Frontal,  lateral, spot lumbosacral lateral, and bilateral oblique views were obtained. There are 5 non-rib-bearing lumbar type vertebral bodies. There is no fracture or spondylolisthesis. There is moderate disc space narrowing at L5-S1. There is slight disc space narrowing at L3-4 and L4-5. There is facet osteoarthritic change at L4-5 and L5-S1 bilaterally. No erosive change.  IMPRESSION: Areas of osteoarthritic change.  No fracture or spondylolisthesis.   Electronically Signed   By: Lowella Grip M.D.   On: 12/20/2013 16:22   Dg Pelvis 1-2 Views  12/20/2013   CLINICAL DATA:  53 year old female with history of trauma from a fall 10:30 this morning with injury to the buttocks, with small skin tear on the left buttocks.  EXAM: PELVIS - 1-2 VIEW  COMPARISON:  No priors.  FINDINGS: There is no evidence of pelvic fracture or diastasis. No pelvic bone lesions are seen.  IMPRESSION: Negative.   Electronically Signed   By: Vinnie Langton M.D.   On: 12/20/2013 15:22   Dg Tibia/fibula Left  12/20/2013   CLINICAL DATA:  Pt fell at 10:30 this AM, landed on buttocks, per EMS small skin tear left buttocks  EXAM: LEFT TIBIA AND FIBULA - 2 VIEW  COMPARISON:  Radiograph 08/26/2010.  FINDINGS: There is periosteal thickening in the distal diaphysis of the tibia and fibula. These findings are similar to comparison exam. No evidence of acute fracture. There is no subcutaneous gas. There are a calcifications within the scan which are new.  IMPRESSION: 1. Periosteal thickening of the distal tibia and fibula. Cannot exclude chronic osteomyelitis. 2. New skin calcifications. 3. No evidence of fracture.   Electronically Signed   By: Suzy Bouchard M.D.   On: 12/20/2013 15:28   Dg Tibia/fibula Right  12/20/2013   CLINICAL DATA:  Pt fell at 10:30 this AM, landed on buttocks, per EMS small skin tear left buttocks. Leg wounds observed with maggots and adhered bandages to skin. EMS states majority of bandages removed, however some were attached to skin. Bilateral lower leg wounds, pain, redness, and swelling.Low back pain. Best obtainable images due to pt condition.  EXAM: RIGHT TIBIA AND FIBULA - 2 VIEW  COMPARISON:  08/26/2010  FINDINGS: There is degenerative fusion of the right knee. No evidence for acute fracture. No radiopaque foreign body or soft tissue gas. There is irregularity of the soft tissues of the calf.  IMPRESSION: 1. Degenerative fusion of the knee. 2. Irregularity of the soft tissues.   Electronically Signed   By: Shon Hale M.D.   On: 12/20/2013 15:28       Microbiology: Recent Results (from the past 240 hour(s))  Culture, blood (routine x 2)     Status: None (Preliminary result)   Collection Time: 12/20/13  2:02 PM  Result Value Ref Range Status   Specimen Description BLOOD RIGHT UPPER ARM  Final   Special Requests BOTTLES DRAWN AEROBIC AND ANAEROBIC 5ML  Final   Culture  Setup Time   Final    12/20/2013  20:20 Performed at [facility]    Culture   Final           BLOOD CULTURE RECEIVED NO GROWTH TO DATE CULTURE WILL BE HELD FOR 5 DAYS BEFORE ISSUING A FINAL NEGATIVE REPORT Performed at [facility]    Report Status PENDING  Incomplete  Culture, blood (routine x 2)     Status: None (Preliminary result)   Collection Time: 12/20/13  2:02 PM  Result Value Ref Range Status   Specimen  Description BLOOD RIGHT ANTECUBITAL  Final   Special Requests BOTTLES DRAWN AEROBIC AND ANAEROBIC 5ML  Final   Culture  Setup Time   Final    12/20/2013 20:21 Performed at [facility]    Culture   Final           BLOOD CULTURE RECEIVED NO GROWTH TO DATE CULTURE WILL BE HELD FOR 5 DAYS BEFORE ISSUING A FINAL NEGATIVE REPORT Performed at [facility]    Report Status PENDING  Incomplete  Wound culture     Status: None   Collection Time: 12/20/13  3:40 PM  Result Value Ref Range Status   Specimen Description LEG RIGHT  Final   Special Requests Normal  Final   Gram Stain   Final    NO WBC SEEN NO SQUAMOUS EPITHELIAL CELLS SEEN MODERATE GRAM NEGATIVE RODS Performed at [facility]    Culture   Final    MULTIPLE ORGANISMS PRESENT, NONE PREDOMINANT Note: NO STAPHYLOCOCCUS AUREUS ISOLATED NO GROUP A STREP (S.PYOGENES) ISOLATED Performed at [facility]    Report Status 12/23/2013 FINAL  Final  Wound culture     Status: None   Collection Time: 12/20/13  3:40 PM  Result Value Ref Range Status   Specimen Description LEG LEFT  Final   Special Requests NONE  Final   Gram Stain   Final    RARE WBC PRESENT, PREDOMINANTLY MONONUCLEAR NO SQUAMOUS EPITHELIAL CELLS SEEN MODERATE GRAM NEGATIVE RODS RARE GRAM POSITIVE COCCI IN PAIRS IN CLUSTERS    Culture   Final    MULTIPLE ORGANISMS PRESENT, NONE PREDOMINANT Note: NO STAPHYLOCOCCUS AUREUS ISOLATED NO GROUP A STREP (S.PYOGENES) ISOLATED Performed at [facility]    Report Status 12/23/2013 FINAL   Final     Labs: Results for orders placed or performed during the hospital encounter of 12/20/13 (from the past 48 hour(s))  CBC     Status: Abnormal   Collection Time: 12/23/13  4:39 AM  Result Value Ref Range   WBC 10.1 4.0 - 10.5 K/uL   RBC 4.12 3.87 - 5.11 MIL/uL   Hemoglobin 8.8 (L) 12.0 - 15.0 g/dL   HCT 28.2 (L) 36.0 - 46.0 %   MCV 68.4 (L) 78.0 - 100.0 fL   MCH 21.4 (L) 26.0 - 34.0 pg   MCHC 31.2 30.0 - 36.0 g/dL   RDW 24.5 (H) 11.5 - 15.5 %   Platelets 280 150 - 400 K/uL  Comprehensive metabolic panel     Status: Abnormal   Collection Time: 12/23/13 11:00 AM  Result Value Ref Range   Sodium 135 (L) 137 - 147 mEq/L   Potassium 3.8 3.7 - 5.3 mEq/L   Chloride 96 96 - 112 mEq/L   CO2 28 19 - 32 mEq/L   Glucose, Bld 120 (H) 70 - 99 mg/dL   BUN 13 6 - 23 mg/dL   Creatinine, Ser 0.87 0.50 - 1.10 mg/dL   Calcium 8.4 8.4 - 10.5 mg/dL   Total Protein 5.9 (L) 6.0 - 8.3 g/dL   Albumin 2.0 (L) 3.5 - 5.2 g/dL   AST 12 0 - 37 U/L   ALT 16 0 - 35 U/L   Alkaline Phosphatase 51 39 - 117 U/L   Total Bilirubin 0.3 0.3 - 1.2 mg/dL   GFR calc non Af Amer 74 (L) >90 mL/min   GFR calc Af Amer 86 (L) >90 mL/min    Comment: (NOTE) The eGFR has been calculated using the CKD  EPI equation. This calculation has not been validated in all clinical situations. eGFR's persistently <90 mL/min signify possible Chronic Kidney Disease.    Anion gap 11 5 - 15     HPI :*53 year old female with a history of myasthenia gravis, chronic venous stasis ulcers, history of DVT hypertension presents to the ED after a fall.Patient reported that she had a fall earlier this morning, 10:30 AM. Patient states that she lost her balance and fell. Patient reported that she has pain in her back and buttocks as well, reported that she was told by EMS that she has skin tears present. Patient reported that she has history of venous stasis - stated that she is supposed to get her wounds cleaned and bandages changed every 6  hours, she has [place] home health and follows with [facility] wound [facility] - she did not receive her supplies for 2-1/2 weeks and therefore wound care stopped coming in.  Patient reported that she has noticed increased swelling, erythema, and drainage. Stated that the smell of her wounds has increased tremendously. She occasionally feels biting into her skin. Denied fever, chills, neck pain, neck stiffness, head injury, loss of conscious, visual changes, chest pain/shortness of breath/difficulty breathing pre and post fall, numbness, tingling, loss of sensation.  In the ED the patient was found to have Extensive circumferential wounds to lower legs bilaterally. Weak pulses in both feet, feet cool to touch. Able to move all toes. Different sized maggots in wounds on both legs. Wound base tissue pink/red/white, wet, occasional gray patch.    HOSPITAL COURSE:   Complicated bilateral lower extremity ulcerations WOC wound consult note Reason for Consult:Bilateral LE ulcerations with larval presence. Right heel with two areas of unblanchable erythema (Stage I) PrU. Patient has long history of treatment at both [facility] and [facility] as well as [place]. Today she tells me that she is being followed by the outpatient wound [facility]). She has seen plastics (Dr. Theodoro Kos) in the past. [facility] at last admission determined that there was not role for their service at that time. She was last seen by me during a [place] admission on 09/04/13.  Wound type:Infectious, chronic venous insufficiency Pressure Ulcer POA: Yes Measurement:Right lateral LE: 10cm x 5cm x 0.5cm. Right posterior LE: 5cm x 4cm x 0.5cm. Left LE (near) circumferential ulceration: 15cm x 26cm x 0.4cm with central necrotic area measuring 4cm x 3cm (debrided) and 3cm x 2cm with larval infestation (removed) Wound bed: Both wounds are 75% pale pink and very moist, 20% yellow finbrinous material and 5%  necrotic eschar. Identified areas of necrotic tissue are debrided via conservative sharp methods today. Drainage (amount, consistency, odor) Moderate amounts of light yellow exudate with strong odor initially, but improved after cleansing and topic care. Periwound:As described above. Periwound skin is protected with clear, zinc based skin protectant (Critic Aid Clear) Dressing procedure/placement/frequency:Patient was premedicated with 85m morphine IV. LEs cleansed and patted gently dry. Ulcers covered with xeroform gauze and a calcium alginate (this is an atypical use of this dressing, but patient cannot tolerate any adherence of the calcium alginate to her wound. This technique allows for moisture management. The left LE then requires padding with two ABD pads (one placed anteriorly and the other posteriorly prior to wrapping both with Kerl;ix gauze roll wrap from toe to knee. This is topped with 4-inch ACE wrap from toe to knee. We will change these three times weekly for now, M-W-F. I have ordered bilateral  Prevalon Boots to bloat the heels in an attempt to resolve the Stage I PrUs on the right heel. I have also ordered a trapeze to see if this will assist in patient mobility while in bed. Patient may benefit from consultation with Plastics in the outpatient setting, this is to be arranged for by PCP Conservative sharp wound debridement (CSWD performed at the bedside): Yes. Two areas of eschar on left LE and several (<20) larvae removed. Larvae placed into a urine specimen cup and the top secured prior to disposal into a biohazard receptacle.    Bilateral lower extremity cellulitis Chronic venous stasis ulcers Skin infestation with maggots Arrange for home health for complex wound care as recommended above Initially started on vancomycin, Zosyn, switched to Augmentin and doxycycline for another 2 weeks Outpatient follow-up with wound care clinic at [facility]  Follow Blood culture 2, negative  so far Plain films negative for osteomyelitis No obvious fracture from the fall Patient will probably need chronic wound care and long-term placement, but declines to go to SNF Hydroxyzine for itching Morphine for dressing changes Unable to do ABI because of intolerance to the pressure from the cuff  Anemia of chronic disease Hemoglobin has been drifting down Hemoglobin 7.0 requiring transfusion of 2 units of packed red blood cells,  Repeat CBC in one week  History of myasthenia gravis Continue mestinon  Hypertension Patient initiated on hydrochlorothiazide  Sinus Tachycardia likely secondary to bilateral lower extremity cellulitis Improved with IV hydration     Discharge Exam:  Blood pressure 148/64, pulse 82, temperature 98.5 F (36.9 C), temperature source Oral, resp. rate 16, height 5' 6"  (1.676 m), weight 85.276 kg (188 lb), last menstrual period 07/12/2011, SpO2 98 %.  Constitutional: Vital signs reviewed. Patient is a well-developed and well-nourished in no acute distress and cooperative with exam. Alert and oriented x3.  Head: Normocephalic and atraumatic  Ear: TM normal bilaterally  Mouth: no erythema or exudates, MMM  Eyes: PERRL, EOMI, conjunctivae normal, No scleral icterus.  Neck: Supple, Trachea midline normal ROM, No JVD, mass, thyromegaly, or carotid bruit present.  Cardiovascular: RRR, S1 normal, S2 normal, no MRG, pulses symmetric and intact bilaterally  Pulmonary/Chest: CTAB, no wheezes, rales, or rhonchi  Abdominal: Soft. Non-tender, non-distended, bowel sounds are normal, no masses, organomegaly, or guarding present.  GU: no CVA tenderness Musculoskeletal: Both lower extremities currently covered with dressing Hematology: no cervical, inginal, or axillary adenopathy.  Neurological: A&O x3, Strenght is normal and symmetric bilaterally, cranial nerve II-XII are grossly intact, no focal motor deficit, sensory intact to light touch bilaterally.   Skin: Both extremities currently covered with a dressing Psychiatric: Normal mood and affect. speech and behavior is normal. Judgment and thought content normal. Cognition and memory are normal.        Discharge Instructions    Diet - low sodium heart healthy    Complete by:  As directed      Increase activity slowly    Complete by:  As directed            Follow-up Information    Follow up with MATTHEWS,MICHELLE A., MD. Schedule an appointment as soon as possible for a visit in 1 week.   Specialty:  Internal Medicine   Contact information:   [place] [address] [phone]       Signed: Reyne Dumas 12/24/2013, 12:21 PM

## 2013-12-24 NOTE — Progress Notes (Addendum)
[facility]  [facility] is providing the following services: Received requests for wheelchair and trapeze bed mount.  However, the order for the wheelchair is actually an order for a w/c cushion, and not the wheelchair itself.  Informed the case manager and will follow up with the nurse as well.  We will need an order for a wheelchair (not the cushion for the wheelchair). If corrected order is given after hours, please fax to [phone].  If patient discharges after hours, please call [phone].   Linward Headland 12/24/2013, 4:28 PM

## 2013-12-24 NOTE — Progress Notes (Signed)
Physical Therapy Treatment Patient Details Name: [name] MRN: [id number] DOB: 1960/11/21 Today's Date: 12/24/2013    History of Present Illness 53 year old female with a history of myasthenia gravis,  RA, chronic venous stasis ulcers, history of DVT hypertension presents to the ED after a fall. Pt was receiving home health nursing per [facility] note she was non compliant- Upon admission wounds were found to be malodorous and infested with larvae.      PT Comments    **Pt was able to transfer bed to recliner with min/guard assist. PT strongly recommending SNF, however pt is declining this and wants to DC home. She has not been able to ambulate while in the hospital but feels she can manage at home alone with WC. Her current WC is broken so she will need WC with elevating leg rests (as R knee does not bend) and a WC cusion due to skin tears on buttocks. HHPT also recommended, though progress note from [name] stated they will not resume care with pt due to non compliance.  *  Follow Up Recommendations  SNF     Equipment Recommendations  Wheelchair (measurements PT);Wheelchair cushion (measurements PT);Other (comment) (pt requests trapeze for her hospital bed, her WC is broken, She needs elevating leg rests for WC and cushion due to skin tears on buttocks)    Recommendations for Other Services OT consult     Precautions / Restrictions Precautions Precautions: Fall Precaution Comments: pt reports only one fall, which occurred just PTA, in past year; skin tears on buttocks Restrictions Weight Bearing Restrictions: Yes Other Position/Activity Restrictions: minimize pressure to heels, stage 1 pressure ulcer R heel    Mobility  Bed Mobility Overal bed mobility: Modified Independent Bed Mobility: Supine to Sit     Supine to sit: Modified independent (Device/Increase time);HOB elevated     General bed mobility comments: used rail  Transfers Overall transfer level: Needs  assistance Equipment used: Rolling walker (2 wheeled) Transfers: Sit to/from [facility] Sit to Stand: From elevated surface;Min guard Stand pivot transfers: +2 safety/equipment;Min guard       General transfer comment: min/guard for safety due to recent fall, pt pushed up on RW  Ambulation/Gait     Assistive device: Rolling walker (2 wheeled)       General Gait Details: NT-pt stated she was in too much pain in BLEs to attempt ambulation   Stairs            Wheelchair Mobility    Modified Rankin (Stroke Patients Only)       Balance     Sitting balance-Leahy Scale: Good       Standing balance-Leahy Scale: Poor                      Cognition Arousal/Alertness: Awake/alert Behavior During Therapy: WFL for tasks assessed/performed Overall Cognitive Status: Within Functional Limits for tasks assessed                      Exercises      General Comments        Pertinent Vitals/Pain Pain Score: 5  Pain Location: BLEs and buttocks Pain Intervention(s): Limited activity within patient's tolerance;Monitored during session;Premedicated before session;Repositioned    Home Living                      Prior Function            PT Goals (current goals can now  be found in the care plan section) Acute Rehab PT Goals Patient Stated Goal: to return home PT Goal Formulation: With patient Time For Goal Achievement: 01/05/14 Potential to Achieve Goals: Fair Progress towards PT goals: Progressing toward goals    Frequency  Min 3X/week    PT Plan Current plan remains appropriate    Co-evaluation             End of Session Equipment Utilized During Treatment: Gait belt Activity Tolerance: Patient limited by pain Patient left: in chair;with call bell/phone within reach     Time: 1120-1138 PT Time Calculation (min) (ACUTE ONLY): 18 min  Charges:  $Therapeutic Activity: 8-22 mins                    G  Codes:      Philomena Doheny 12/24/2013, 11:46 AM [phone]

## 2013-12-25 DIAGNOSIS — L89322 Pressure ulcer of left buttock, stage 2: Secondary | ICD-10-CM | POA: Diagnosis not present

## 2013-12-25 DIAGNOSIS — L03115 Cellulitis of right lower limb: Secondary | ICD-10-CM | POA: Diagnosis not present

## 2013-12-25 DIAGNOSIS — G7000 Myasthenia gravis without (acute) exacerbation: Secondary | ICD-10-CM | POA: Diagnosis not present

## 2013-12-25 DIAGNOSIS — L89312 Pressure ulcer of right buttock, stage 2: Secondary | ICD-10-CM | POA: Diagnosis not present

## 2013-12-25 DIAGNOSIS — L97223 Non-pressure chronic ulcer of left calf with necrosis of muscle: Secondary | ICD-10-CM | POA: Diagnosis not present

## 2013-12-25 DIAGNOSIS — Z4800 Encounter for change or removal of nonsurgical wound dressing: Secondary | ICD-10-CM | POA: Diagnosis not present

## 2013-12-25 DIAGNOSIS — D638 Anemia in other chronic diseases classified elsewhere: Secondary | ICD-10-CM | POA: Diagnosis not present

## 2013-12-25 DIAGNOSIS — L97213 Non-pressure chronic ulcer of right calf with necrosis of muscle: Secondary | ICD-10-CM | POA: Diagnosis not present

## 2013-12-25 DIAGNOSIS — L03116 Cellulitis of left lower limb: Secondary | ICD-10-CM | POA: Diagnosis not present

## 2013-12-25 DIAGNOSIS — I872 Venous insufficiency (chronic) (peripheral): Secondary | ICD-10-CM | POA: Diagnosis not present

## 2013-12-26 DIAGNOSIS — L03115 Cellulitis of right lower limb: Secondary | ICD-10-CM | POA: Diagnosis not present

## 2013-12-26 DIAGNOSIS — L97223 Non-pressure chronic ulcer of left calf with necrosis of muscle: Secondary | ICD-10-CM | POA: Diagnosis not present

## 2013-12-26 DIAGNOSIS — L89312 Pressure ulcer of right buttock, stage 2: Secondary | ICD-10-CM | POA: Diagnosis not present

## 2013-12-26 DIAGNOSIS — I872 Venous insufficiency (chronic) (peripheral): Secondary | ICD-10-CM | POA: Diagnosis not present

## 2013-12-26 DIAGNOSIS — L97213 Non-pressure chronic ulcer of right calf with necrosis of muscle: Secondary | ICD-10-CM | POA: Diagnosis not present

## 2013-12-26 DIAGNOSIS — L89322 Pressure ulcer of left buttock, stage 2: Secondary | ICD-10-CM | POA: Diagnosis not present

## 2013-12-26 LAB — CULTURE, BLOOD (ROUTINE X 2)
Culture: NO GROWTH
Culture: NO GROWTH

## 2013-12-27 DIAGNOSIS — L89322 Pressure ulcer of left buttock, stage 2: Secondary | ICD-10-CM | POA: Diagnosis not present

## 2013-12-27 DIAGNOSIS — L97213 Non-pressure chronic ulcer of right calf with necrosis of muscle: Secondary | ICD-10-CM | POA: Diagnosis not present

## 2013-12-27 DIAGNOSIS — I872 Venous insufficiency (chronic) (peripheral): Secondary | ICD-10-CM | POA: Diagnosis not present

## 2013-12-27 DIAGNOSIS — L97223 Non-pressure chronic ulcer of left calf with necrosis of muscle: Secondary | ICD-10-CM | POA: Diagnosis not present

## 2013-12-27 DIAGNOSIS — L03115 Cellulitis of right lower limb: Secondary | ICD-10-CM | POA: Diagnosis not present

## 2013-12-27 DIAGNOSIS — L89312 Pressure ulcer of right buttock, stage 2: Secondary | ICD-10-CM | POA: Diagnosis not present

## 2013-12-28 DIAGNOSIS — I872 Venous insufficiency (chronic) (peripheral): Secondary | ICD-10-CM | POA: Diagnosis not present

## 2013-12-28 DIAGNOSIS — L03115 Cellulitis of right lower limb: Secondary | ICD-10-CM | POA: Diagnosis not present

## 2013-12-28 DIAGNOSIS — L89322 Pressure ulcer of left buttock, stage 2: Secondary | ICD-10-CM | POA: Diagnosis not present

## 2013-12-28 DIAGNOSIS — L97213 Non-pressure chronic ulcer of right calf with necrosis of muscle: Secondary | ICD-10-CM | POA: Diagnosis not present

## 2013-12-28 DIAGNOSIS — L97223 Non-pressure chronic ulcer of left calf with necrosis of muscle: Secondary | ICD-10-CM | POA: Diagnosis not present

## 2013-12-28 DIAGNOSIS — L89312 Pressure ulcer of right buttock, stage 2: Secondary | ICD-10-CM | POA: Diagnosis not present

## 2014-01-01 ENCOUNTER — Telehealth: Payer: Self-pay | Admitting: Internal Medicine

## 2014-01-01 NOTE — Telephone Encounter (Signed)
Left message for patient to call to schedule follow up appointment.

## 2014-01-03 DIAGNOSIS — L03115 Cellulitis of right lower limb: Secondary | ICD-10-CM | POA: Diagnosis not present

## 2014-01-03 DIAGNOSIS — I872 Venous insufficiency (chronic) (peripheral): Secondary | ICD-10-CM | POA: Diagnosis not present

## 2014-01-03 DIAGNOSIS — L97223 Non-pressure chronic ulcer of left calf with necrosis of muscle: Secondary | ICD-10-CM | POA: Diagnosis not present

## 2014-01-03 DIAGNOSIS — L97213 Non-pressure chronic ulcer of right calf with necrosis of muscle: Secondary | ICD-10-CM | POA: Diagnosis not present

## 2014-01-03 DIAGNOSIS — L89312 Pressure ulcer of right buttock, stage 2: Secondary | ICD-10-CM | POA: Diagnosis not present

## 2014-01-03 DIAGNOSIS — L89322 Pressure ulcer of left buttock, stage 2: Secondary | ICD-10-CM | POA: Diagnosis not present

## 2014-01-09 DIAGNOSIS — L03115 Cellulitis of right lower limb: Secondary | ICD-10-CM | POA: Diagnosis not present

## 2014-01-09 DIAGNOSIS — L89312 Pressure ulcer of right buttock, stage 2: Secondary | ICD-10-CM | POA: Diagnosis not present

## 2014-01-09 DIAGNOSIS — L97213 Non-pressure chronic ulcer of right calf with necrosis of muscle: Secondary | ICD-10-CM | POA: Diagnosis not present

## 2014-01-09 DIAGNOSIS — I872 Venous insufficiency (chronic) (peripheral): Secondary | ICD-10-CM | POA: Diagnosis not present

## 2014-01-09 DIAGNOSIS — L97223 Non-pressure chronic ulcer of left calf with necrosis of muscle: Secondary | ICD-10-CM | POA: Diagnosis not present

## 2014-01-09 DIAGNOSIS — L89322 Pressure ulcer of left buttock, stage 2: Secondary | ICD-10-CM | POA: Diagnosis not present

## 2014-01-11 ENCOUNTER — Inpatient Hospital Stay (HOSPITAL_COMMUNITY)
Admission: EM | Admit: 2014-01-11 | Discharge: 2014-01-16 | DRG: 871 | Disposition: A | Discharge status: Skilled Nursing Facility | Payer: Medicare Other | Attending: Internal Medicine | Admitting: Internal Medicine

## 2014-01-11 ENCOUNTER — Encounter (HOSPITAL_COMMUNITY): Payer: Self-pay | Admitting: Emergency Medicine

## 2014-01-11 DIAGNOSIS — L03115 Cellulitis of right lower limb: Secondary | ICD-10-CM | POA: Diagnosis present

## 2014-01-11 DIAGNOSIS — L97929 Non-pressure chronic ulcer of unspecified part of left lower leg with unspecified severity: Secondary | ICD-10-CM | POA: Diagnosis present

## 2014-01-11 DIAGNOSIS — L89329 Pressure ulcer of left buttock, unspecified stage: Secondary | ICD-10-CM | POA: Diagnosis not present

## 2014-01-11 DIAGNOSIS — Z86718 Personal history of other venous thrombosis and embolism: Secondary | ICD-10-CM | POA: Diagnosis not present

## 2014-01-11 DIAGNOSIS — L03116 Cellulitis of left lower limb: Secondary | ICD-10-CM | POA: Diagnosis not present

## 2014-01-11 DIAGNOSIS — M7989 Other specified soft tissue disorders: Secondary | ICD-10-CM | POA: Diagnosis not present

## 2014-01-11 DIAGNOSIS — D509 Iron deficiency anemia, unspecified: Secondary | ICD-10-CM | POA: Diagnosis present

## 2014-01-11 DIAGNOSIS — L97919 Non-pressure chronic ulcer of unspecified part of right lower leg with unspecified severity: Secondary | ICD-10-CM | POA: Diagnosis present

## 2014-01-11 DIAGNOSIS — I878 Other specified disorders of veins: Secondary | ICD-10-CM | POA: Diagnosis present

## 2014-01-11 DIAGNOSIS — M069 Rheumatoid arthritis, unspecified: Secondary | ICD-10-CM | POA: Diagnosis present

## 2014-01-11 DIAGNOSIS — E860 Dehydration: Secondary | ICD-10-CM | POA: Diagnosis present

## 2014-01-11 DIAGNOSIS — D638 Anemia in other chronic diseases classified elsewhere: Secondary | ICD-10-CM | POA: Diagnosis present

## 2014-01-11 DIAGNOSIS — I509 Heart failure, unspecified: Secondary | ICD-10-CM | POA: Diagnosis present

## 2014-01-11 DIAGNOSIS — N179 Acute kidney failure, unspecified: Secondary | ICD-10-CM | POA: Diagnosis present

## 2014-01-11 DIAGNOSIS — M6281 Muscle weakness (generalized): Secondary | ICD-10-CM | POA: Diagnosis not present

## 2014-01-11 DIAGNOSIS — L8993 Pressure ulcer of unspecified site, stage 3: Secondary | ICD-10-CM | POA: Diagnosis not present

## 2014-01-11 DIAGNOSIS — T798XXD Other early complications of trauma, subsequent encounter: Secondary | ICD-10-CM | POA: Diagnosis not present

## 2014-01-11 DIAGNOSIS — L89159 Pressure ulcer of sacral region, unspecified stage: Secondary | ICD-10-CM | POA: Diagnosis present

## 2014-01-11 DIAGNOSIS — I872 Venous insufficiency (chronic) (peripheral): Secondary | ICD-10-CM | POA: Diagnosis not present

## 2014-01-11 DIAGNOSIS — L89312 Pressure ulcer of right buttock, stage 2: Secondary | ICD-10-CM | POA: Diagnosis not present

## 2014-01-11 DIAGNOSIS — L89611 Pressure ulcer of right heel, stage 1: Secondary | ICD-10-CM | POA: Diagnosis not present

## 2014-01-11 DIAGNOSIS — L89309 Pressure ulcer of unspecified buttock, unspecified stage: Secondary | ICD-10-CM | POA: Diagnosis not present

## 2014-01-11 DIAGNOSIS — I10 Essential (primary) hypertension: Secondary | ICD-10-CM | POA: Diagnosis present

## 2014-01-11 DIAGNOSIS — E872 Acidosis, unspecified: Secondary | ICD-10-CM

## 2014-01-11 DIAGNOSIS — T798XXA Other early complications of trauma, initial encounter: Secondary | ICD-10-CM | POA: Diagnosis not present

## 2014-01-11 DIAGNOSIS — L84 Corns and callosities: Secondary | ICD-10-CM | POA: Diagnosis present

## 2014-01-11 DIAGNOSIS — L97223 Non-pressure chronic ulcer of left calf with necrosis of muscle: Secondary | ICD-10-CM | POA: Diagnosis not present

## 2014-01-11 DIAGNOSIS — L03119 Cellulitis of unspecified part of limb: Secondary | ICD-10-CM | POA: Diagnosis not present

## 2014-01-11 DIAGNOSIS — R6520 Severe sepsis without septic shock: Secondary | ICD-10-CM | POA: Diagnosis not present

## 2014-01-11 DIAGNOSIS — D649 Anemia, unspecified: Secondary | ICD-10-CM | POA: Diagnosis not present

## 2014-01-11 DIAGNOSIS — L97213 Non-pressure chronic ulcer of right calf with necrosis of muscle: Secondary | ICD-10-CM | POA: Diagnosis not present

## 2014-01-11 DIAGNOSIS — J45909 Unspecified asthma, uncomplicated: Secondary | ICD-10-CM | POA: Diagnosis present

## 2014-01-11 DIAGNOSIS — A419 Sepsis, unspecified organism: Secondary | ICD-10-CM | POA: Diagnosis not present

## 2014-01-11 DIAGNOSIS — E876 Hypokalemia: Secondary | ICD-10-CM | POA: Diagnosis present

## 2014-01-11 DIAGNOSIS — E669 Obesity, unspecified: Secondary | ICD-10-CM | POA: Diagnosis present

## 2014-01-11 DIAGNOSIS — L89322 Pressure ulcer of left buttock, stage 2: Secondary | ICD-10-CM | POA: Diagnosis not present

## 2014-01-11 DIAGNOSIS — L299 Pruritus, unspecified: Secondary | ICD-10-CM | POA: Diagnosis present

## 2014-01-11 DIAGNOSIS — M79669 Pain in unspecified lower leg: Secondary | ICD-10-CM | POA: Diagnosis not present

## 2014-01-11 DIAGNOSIS — G7000 Myasthenia gravis without (acute) exacerbation: Secondary | ICD-10-CM | POA: Diagnosis present

## 2014-01-11 DIAGNOSIS — M79606 Pain in leg, unspecified: Secondary | ICD-10-CM

## 2014-01-11 DIAGNOSIS — I83009 Varicose veins of unspecified lower extremity with ulcer of unspecified site: Secondary | ICD-10-CM | POA: Diagnosis present

## 2014-01-11 DIAGNOSIS — L97909 Non-pressure chronic ulcer of unspecified part of unspecified lower leg with unspecified severity: Secondary | ICD-10-CM | POA: Diagnosis present

## 2014-01-11 DIAGNOSIS — L089 Local infection of the skin and subcutaneous tissue, unspecified: Secondary | ICD-10-CM

## 2014-01-11 DIAGNOSIS — T148XXA Other injury of unspecified body region, initial encounter: Secondary | ICD-10-CM

## 2014-01-11 DIAGNOSIS — L0390 Cellulitis, unspecified: Secondary | ICD-10-CM | POA: Diagnosis present

## 2014-01-11 HISTORY — DX: Benign neoplasm of connective and other soft tissue, unspecified: D21.9

## 2014-01-11 LAB — CBC
HCT: 37.1 % (ref 36.0–46.0)
Hemoglobin: 11.8 g/dL — ABNORMAL LOW (ref 12.0–15.0)
MCH: 21.8 pg — AB (ref 26.0–34.0)
MCHC: 31.8 g/dL (ref 30.0–36.0)
MCV: 68.5 fL — AB (ref 78.0–100.0)
Platelets: ADEQUATE 10*3/uL (ref 150–400)
RBC: 5.42 MIL/uL — ABNORMAL HIGH (ref 3.87–5.11)
RDW: 26.1 % — AB (ref 11.5–15.5)
WBC: 26.9 10*3/uL — ABNORMAL HIGH (ref 4.0–10.5)

## 2014-01-11 LAB — I-STAT CHEM 8, ED
BUN: 43 mg/dL — ABNORMAL HIGH (ref 6–23)
CHLORIDE: 101 meq/L (ref 96–112)
Calcium, Ion: 1.12 mmol/L (ref 1.12–1.23)
Creatinine, Ser: 1.4 mg/dL — ABNORMAL HIGH (ref 0.50–1.10)
Glucose, Bld: 148 mg/dL — ABNORMAL HIGH (ref 70–99)
HEMATOCRIT: 42 % (ref 36.0–46.0)
Hemoglobin: 14.3 g/dL (ref 12.0–15.0)
POTASSIUM: 3.4 meq/L — AB (ref 3.7–5.3)
Sodium: 136 mEq/L — ABNORMAL LOW (ref 137–147)
TCO2: 26 mmol/L (ref 0–100)

## 2014-01-11 LAB — BASIC METABOLIC PANEL
Anion gap: 19 — ABNORMAL HIGH (ref 5–15)
BUN: 34 mg/dL — ABNORMAL HIGH (ref 6–23)
CALCIUM: 9.9 mg/dL (ref 8.4–10.5)
CO2: 23 mEq/L (ref 19–32)
CREATININE: 1.12 mg/dL — AB (ref 0.50–1.10)
Chloride: 95 mEq/L — ABNORMAL LOW (ref 96–112)
GFR, EST AFRICAN AMERICAN: 63 mL/min — AB (ref >90)
GFR, EST NON AFRICAN AMERICAN: 55 mL/min — AB (ref >90)
GLUCOSE: 148 mg/dL — AB (ref 70–99)
POTASSIUM: 3.3 meq/L — AB (ref 3.7–5.3)
Sodium: 137 mEq/L (ref 137–147)

## 2014-01-11 LAB — I-STAT CG4 LACTIC ACID, ED: Lactic Acid, Venous: 3.53 mmol/L — ABNORMAL HIGH (ref 0.5–2.2)

## 2014-01-11 MED ORDER — SENNA 8.6 MG PO TABS
1.0000 | ORAL_TABLET | Freq: Two times a day (BID) | ORAL | Status: DC
  Administered 2014-01-12 – 2014-01-16 (×10): 8.6 mg via ORAL
  Filled 2014-01-11 (×9): qty 1

## 2014-01-11 MED ORDER — HYDROXYZINE HCL 25 MG PO TABS
25.0000 mg | ORAL_TABLET | ORAL | Status: DC | PRN
  Administered 2014-01-13: 25 mg via ORAL
  Filled 2014-01-11 (×2): qty 1

## 2014-01-11 MED ORDER — VANCOMYCIN HCL 10 G IV SOLR: 1500.0000 mg | INTRAVENOUS | Status: DC

## 2014-01-11 MED ORDER — PIPERACILLIN-TAZOBACTAM 3.375 G IVPB
3.3750 g | Freq: Once | INTRAVENOUS | Status: AC
  Administered 2014-01-11: 3.375 g via INTRAVENOUS
  Filled 2014-01-11 (×2): qty 50

## 2014-01-11 MED ORDER — PYRIDOSTIGMINE BROMIDE 60 MG PO TABS
60.0000 mg | ORAL_TABLET | Freq: Four times a day (QID) | ORAL | Status: DC
  Administered 2014-01-12 – 2014-01-16 (×19): 60 mg via ORAL
  Filled 2014-01-11 (×22): qty 1

## 2014-01-11 MED ORDER — SODIUM CHLORIDE 0.9 % IV SOLN: Freq: Once | INTRAVENOUS | Status: DC

## 2014-01-11 MED ORDER — OXYCODONE HCL 5 MG PO TABS
10.0000 mg | ORAL_TABLET | ORAL | Status: DC | PRN
  Administered 2014-01-11 – 2014-01-16 (×19): 10 mg via ORAL
  Filled 2014-01-11 (×20): qty 2

## 2014-01-11 MED ORDER — HYDROCHLOROTHIAZIDE 25 MG PO TABS
25.0000 mg | ORAL_TABLET | Freq: Every day | ORAL | Status: DC
  Administered 2014-01-12 – 2014-01-16 (×5): 25 mg via ORAL
  Filled 2014-01-11 (×5): qty 1

## 2014-01-11 MED ORDER — SODIUM CHLORIDE 0.9 % IV BOLUS (SEPSIS)
500.0000 mL | Freq: Once | INTRAVENOUS | Status: AC
  Administered 2014-01-11: 500 mL via INTRAVENOUS

## 2014-01-11 MED ORDER — SODIUM CHLORIDE 0.9 % IV BOLUS (SEPSIS)
1000.0000 mL | Freq: Once | INTRAVENOUS | Status: AC
  Administered 2014-01-11: 1000 mL via INTRAVENOUS

## 2014-01-11 MED ORDER — HEPARIN SODIUM (PORCINE) 5000 UNIT/ML IJ SOLN
5000.0000 [IU] | Freq: Three times a day (TID) | INTRAMUSCULAR | Status: DC
  Administered 2014-01-12 – 2014-01-16 (×13): 5000 [IU] via SUBCUTANEOUS
  Filled 2014-01-11 (×16): qty 1

## 2014-01-11 MED ORDER — MORPHINE SULFATE 4 MG/ML IJ SOLN
4.0000 mg | INTRAMUSCULAR | Status: DC | PRN
  Administered 2014-01-12 – 2014-01-15 (×8): 4 mg via INTRAVENOUS
  Filled 2014-01-11 (×9): qty 1

## 2014-01-11 MED ORDER — MORPHINE SULFATE 4 MG/ML IJ SOLN
4.0000 mg | INTRAMUSCULAR | Status: DC | PRN
  Administered 2014-01-11: 4 mg via INTRAVENOUS
  Filled 2014-01-11: qty 1

## 2014-01-11 MED ORDER — VANCOMYCIN HCL 10 G IV SOLR
1500.0000 mg | INTRAVENOUS | Status: AC
  Administered 2014-01-11: 1500 mg via INTRAVENOUS
  Filled 2014-01-11: qty 1500

## 2014-01-11 MED ORDER — ONDANSETRON HCL 4 MG/2ML IJ SOLN: 4.0000 mg | Freq: Three times a day (TID) | INTRAMUSCULAR | Status: DC | PRN

## 2014-01-11 MED ORDER — ONDANSETRON HCL 4 MG PO TABS: 4.0000 mg | ORAL_TABLET | Freq: Four times a day (QID) | ORAL | Status: DC | PRN

## 2014-01-11 MED ORDER — SODIUM CHLORIDE 0.9 % IV SOLN
INTRAVENOUS | Status: DC
  Administered 2014-01-12: 01:00:00 via INTRAVENOUS

## 2014-01-11 MED ORDER — ONDANSETRON HCL 4 MG/2ML IJ SOLN: 4.0000 mg | Freq: Four times a day (QID) | INTRAMUSCULAR | Status: DC | PRN

## 2014-01-11 NOTE — Progress Notes (Signed)
[facility] asked patient if she has made a follow up appointment with [name]?  Patient reports, "No not yet.  I have to do that."  [facility] informed patient that she was to make an appointment to see [name] a week after she was discharged.  EDCM noted pcp office left message for patient to schedule an appointment.  EDCM will inbox [name] regarding appointment.  Patient reports she uses the services of [facility] to get to her appointments.  Patient reports she did not receive equipment ordered for her wheelchair, trapeze bar from previous admission.  She reports Jenny Reichmann the Education officer, [employer] of [place] is helping her with that.  EDCM will email Desi Carby Tama Gander of [facility] to assist with making follow up appointment with patient's pcp, and to check on equipment status.  No further EDCM needs at this time.

## 2014-01-11 NOTE — ED Notes (Signed)
[place], PA made aware of patient CG4 Lactic results.

## 2014-01-11 NOTE — ED Provider Notes (Signed)
CSN: [id number]     Arrival date & time 01/11/14  1946 History   First MD Initiated Contact with Patient 01/11/14 2132     Chief Complaint  Patient presents with  . infected wounds     bil LE  . Cellulitis     (Consider location/radiation/quality/duration/timing/severity/associated sxs/prior Treatment) HPI Comments: Patient with history of severe venous stasis ulcers presents with worsening wound infection to her bilateral lower extremities over the past 1 week. She has noted increased odor, drainage, redness of these areas. Patient has daily aides who come to her house to help change her dressings. Unfortunately, she has been unable to receive her wound care supplies for the past 1 week. She has not been able to get her usual wound care over this period. She has not had fever but has had increased pain. No nausea, vomiting, or diarrhea. No urinary symptoms. Patient had a similar admission in November 2015. She is not currently on any antibiotics.  The history is provided by the patient and medical records.    Past Medical History  Diagnosis Date  . Asthma   . Hypertension   . CHF (congestive heart failure)   . Myasthenia gravis   . Heart murmur   . Venous stasis   . DVT (deep venous thrombosis) 2000, 2004    Not on anticoagulation    Past Surgical History  Procedure Laterality Date  . Tonsillectomy    . Myomectomy    . Failed bilateral leg grafts     Family History  Problem Relation Age of Onset  . Depression    . Cancer     History  Substance Use Topics  . Smoking status: Never Smoker   . Smokeless tobacco: Never Used  . Alcohol Use: No   OB History    No data available     Review of Systems  Constitutional: Negative for fever.  HENT: Negative for rhinorrhea and sore throat.   Eyes: Negative for redness.  Respiratory: Negative for cough.   Cardiovascular: Negative for chest pain.  Gastrointestinal: Negative for nausea, vomiting, abdominal pain and diarrhea.   Genitourinary: Negative for dysuria.  Musculoskeletal: Positive for myalgias.  Skin: Positive for color change and wound. Negative for rash.  Neurological: Positive for headaches.    Allergies  Asa; Cat hair extract; Dilaudid; Ibuprofen; Tylenol; Ciprofloxacin; Dicloxacillin; Erythromycin; Hydromorphone; Indomethacin; Naproxen; Nsaids; Tramadol; Alginate-collagen; Gold-containing drug products; and Silvadene  Home Medications   Prior to Admission medications   Medication Sig Start Date End Date Taking? Authorizing Provider  albuterol (PROVENTIL HFA;VENTOLIN HFA) 108 (90 BASE) MCG/ACT inhaler Inhale 4 puffs into the lungs every 6 (six) hours as needed. For shortness of breath    Historical Provider, MD  COD LIVER OIL PO Take 2 capsules by mouth daily.    Historical Provider, MD  diphenhydrAMINE (BENADRYL) 25 MG tablet Take 1 tablet (25 mg total) by mouth every 6 (six) hours as needed. 09/09/13   Nishant Dhungel, MD  Emollient (NIVEA) cream Apply 1 application topically daily. Apply all over body    Historical Provider, MD  EPINEPHrine (EPIPEN) 0.3 mg/0.3 mL DEVI Inject 0.3 mg into the muscle as needed. For allergic reaction    Historical Provider, MD  ferrous sulfate 325 (65 FE) MG tablet Take 325 mg by mouth daily with breakfast.     Historical Provider, MD  folic acid (FOLVITE) 240 MCG tablet Take 400 mcg by mouth daily.    Historical Provider, MD  Garlic Oil 9735 MG  CAPS Take 1,000 mg by mouth daily.    Historical Provider, MD  hydrochlorothiazide (HYDRODIURIL) 25 MG tablet Take 1 tablet (25 mg total) by mouth daily. 12/24/13   Reyne Dumas, MD  hydrocortisone cream 1 % Apply topically 2 (two) times daily. 12/24/13   Reyne Dumas, MD  hydrOXYzine (ATARAX/VISTARIL) 25 MG tablet Take 1 tablet (25 mg total) by mouth every 4 (four) hours as needed for itching. 12/24/13   Reyne Dumas, MD  lidocaine (XYLOCAINE) 4 % external solution Apply 5 mLs topically 4 (four) times daily. 10/15/13   Leana Gamer, MD  nystatin cream (MYCOSTATIN) Apply 1 application topically 2 (two) times daily. 09/27/13   Leana Gamer, MD  Omega-3 Fatty Acids (FISH OIL) 500 MG CAPS Take 1,000 mg by mouth daily.    Historical Provider, MD  oxyCODONE (OXY IR/ROXICODONE) 5 MG immediate release tablet Take 1 tablet (5 mg total) by mouth every 4 (four) hours as needed for severe pain. 12/24/13   Reyne Dumas, MD  pyridostigmine (MESTINON) 60 MG tablet Take 1 tablet (60 mg total) by mouth 4 (four) times daily. 10/05/13   Marcial Pacas, MD   BP 144/93 mmHg  Pulse 109  Temp(Src) 97.6 F (36.4 C) (Oral)  Resp 18  SpO2 99%  LMP 07/12/2011   Physical Exam  Constitutional: She appears well-developed and well-nourished.  HENT:  Head: Normocephalic and atraumatic.  Mouth/Throat: Oropharynx is clear and moist.  Eyes: Conjunctivae are normal. Right eye exhibits no discharge. Left eye exhibits no discharge.  Neck: Normal range of motion. Neck supple.  Cardiovascular: Regular rhythm.  Tachycardia present.   Murmur (Systolic) heard. Pulmonary/Chest: Effort normal and breath sounds normal.  Abdominal: Soft. There is no tenderness. There is no rebound and no guarding.  Musculoskeletal: She exhibits edema and tenderness.  Neurological: She is alert.  Skin: Skin is warm. There is erythema.  Patient with severe irregular ulcerations of her bilateral lower extremities. Areas were emaciated with active purulent drainage. Foul odor. Left lower extremity wound is circumferential. There is a large amount of associated cellulitis extending to bilateral feet.   Psychiatric: She has a normal mood and affect.  Nursing note and vitals reviewed.   ED Course  Procedures (including critical care time) Labs Review Labs Reviewed  BASIC METABOLIC PANEL - Abnormal; Notable for the following:    Potassium 3.3 (*)    Chloride 95 (*)    Glucose, Bld 148 (*)    BUN 34 (*)    Creatinine, Ser 1.12 (*)    GFR calc non Af Amer 55 (*)     GFR calc Af Amer 63 (*)    Anion gap 19 (*)    All other components within normal limits  CBC - Abnormal; Notable for the following:    WBC 26.9 (*)    RBC 5.42 (*)    Hemoglobin 11.8 (*)    MCV 68.5 (*)    MCH 21.8 (*)    RDW 26.1 (*)    All other components within normal limits  I-STAT CHEM 8, ED - Abnormal; Notable for the following:    Sodium 136 (*)    Potassium 3.4 (*)    BUN 43 (*)    Creatinine, Ser 1.40 (*)    Glucose, Bld 148 (*)    All other components within normal limits  I-STAT CG4 LACTIC ACID, ED - Abnormal; Notable for the following:    Lactic Acid, Venous 3.53 (*)    All other components  within normal limits  CULTURE, BLOOD (ROUTINE X 2)  WOUND CULTURE  WOUND CULTURE    Imaging Review No results found.   EKG Interpretation None       9:55 PM Patient seen and examined. Work-up initiated. Medications ordered. Discussed with Dr. Aline Brochure who will see.   Vital signs reviewed and are as follows: BP 144/93 mmHg  Pulse 109  Temp(Src) 97.6 F (36.4 C) (Oral)  Resp 18  SpO2 99%  LMP 07/12/2011  10:22 PM Will give 500cc boluses and recheck breathing between each bolus. Pt has HF history although I cannot find a recent ECHO. Pt seen by Dr. Aline Brochure. Vanc/zosyn ordered. Will admit. Lactate 3.5 and WBC 27k.   10:39 PM Dr. Marily Memos to see.   MDM   Final diagnoses:  Cellulitis of lower extremity, unspecified laterality  Sepsis, due to unspecified organism   Admit.    Carlisle Cater, PA-C 01/11/14 2239  Pamella Pert, MD 01/12/14 [phone]

## 2014-01-11 NOTE — ED Notes (Signed)
Per EMS , pt. From home with complaint of open wounds and cellulitis on bilateral lower legs to feet , pt. Has been seen by wound care Nursea week  ago and pt. Claimed that shes the one doing her wounds since then. Pt. Bil. Lower legs open wounds has been there for 7 years. Pt. Think that she must have wrapped her legs so tight . Pt.s' wounds has foul odor and dressings soak with drainage. Alert and oriented x3,

## 2014-01-11 NOTE — Progress Notes (Signed)
EDCM spoke to patient at bedside.  Patient reports she lives alone.  Patient's family is in [place] as per patient.  Patient reports she has a neighbor that checks in on her and goes shopping for her from time to time. Patient recently discharged from the hospital on 11/30 for cellulitis to bilateral lower extremities.  Patient confirms she has home health services with [facility] and she is pleased with her care.  Patient confirms she is being seen by the social worker Jenny Reichmann from [facility] as well.  [facility] asked patient if she would be interested in going to a SNF.  Patient adamantly refuses SNF.  EDSW spoke with patient earlier and will be filing APS for poor living conditions per EDSW.  No further EDCM needs at this time.

## 2014-01-11 NOTE — Progress Notes (Signed)
ANTIBIOTIC CONSULT NOTE - INITIAL  Pharmacy Consult for Vancomycin Indication: Cellulitis  Allergies  Allergen Reactions  . Asa [Aspirin] Shortness Of Breath, Rash and Anaphylaxis  . Cat Hair Extract Anaphylaxis  . Dilaudid [Hydromorphone Hcl] Shortness Of Breath and Palpitations  . Ibuprofen Anaphylaxis  . Tylenol [Acetaminophen] Rash, Other (See Comments) and Anaphylaxis    Other reaction(s): SHORTNESS OF BREATH Salivation and hearing loss  . Ciprofloxacin     Other reaction(s): OTHER  . Dicloxacillin     Other reaction(s): OTHER  . Erythromycin Other (See Comments)    Burn veins  . Hydromorphone     Other reaction(s): OTHER  . Indomethacin Other (See Comments)    Other reaction(s): OTHER Stroke symptoms  . Naproxen Other (See Comments)    Other reaction(s): OTHER Renal failure  . Nsaids Other (See Comments)    Kidney failure   . Tramadol     "stroke"  . Alginate-Collagen [Wound Dressings] Rash  . Gold-Containing Drug Products Rash  . Silvadene [Silver Sulfadiazine] Rash    Patient Measurements:   Adjusted Body Weight:   Vital Signs: Temp: 97.6 F (36.4 C) (12/18 1949) Temp Source: Oral (12/18 1949) BP: 144/93 mmHg (12/18 1949) Pulse Rate: 109 (12/18 1949) Intake/Output from previous day:   Intake/Output from this shift:    Labs:  Recent Labs  01/11/14 2120  HGB 14.3  CREATININE 1.40*   CrCl cannot be calculated (Unknown ideal weight.). No results for input(s): VANCOTROUGH, VANCOPEAK, VANCORANDOM, GENTTROUGH, GENTPEAK, GENTRANDOM, TOBRATROUGH, TOBRAPEAK, TOBRARND, AMIKACINPEAK, AMIKACINTROU, AMIKACIN in the last 72 hours.   Microbiology: Recent Results (from the past 720 hour(s))  Culture, blood (routine x 2)     Status: None   Collection Time: 12/20/13  2:02 PM  Result Value Ref Range Status   Specimen Description BLOOD RIGHT UPPER ARM  Final   Special Requests BOTTLES DRAWN AEROBIC AND ANAEROBIC 5ML  Final   Culture  Setup Time   Final   12/20/2013 20:20 Performed at [facility]    Culture   Final    NO GROWTH 5 DAYS Performed at [facility]    Report Status 12/26/2013 FINAL  Final  Culture, blood (routine x 2)     Status: None   Collection Time: 12/20/13  2:02 PM  Result Value Ref Range Status   Specimen Description BLOOD RIGHT ANTECUBITAL  Final   Special Requests BOTTLES DRAWN AEROBIC AND ANAEROBIC 5ML  Final   Culture  Setup Time   Final    12/20/2013 20:21 Performed at [facility]    Culture   Final    NO GROWTH 5 DAYS Performed at [facility]    Report Status 12/26/2013 FINAL  Final  Wound culture     Status: None   Collection Time: 12/20/13  3:40 PM  Result Value Ref Range Status   Specimen Description LEG RIGHT  Final   Special Requests Normal  Final   Gram Stain   Final    NO WBC SEEN NO SQUAMOUS EPITHELIAL CELLS SEEN MODERATE GRAM NEGATIVE RODS Performed at [facility]    Culture   Final    MULTIPLE ORGANISMS PRESENT, NONE PREDOMINANT Note: NO STAPHYLOCOCCUS AUREUS ISOLATED NO GROUP A STREP (S.PYOGENES) ISOLATED Performed at [facility]    Report Status 12/23/2013 FINAL  Final  Wound culture     Status: None   Collection Time: 12/20/13  3:40 PM  Result Value Ref Range Status   Specimen Description LEG LEFT  Final   Special Requests NONE  Final   Gram Stain   Final    RARE WBC PRESENT, PREDOMINANTLY MONONUCLEAR NO SQUAMOUS EPITHELIAL CELLS SEEN MODERATE GRAM NEGATIVE RODS RARE GRAM POSITIVE COCCI IN PAIRS IN CLUSTERS    Culture   Final    MULTIPLE ORGANISMS PRESENT, NONE PREDOMINANT Note: NO STAPHYLOCOCCUS AUREUS ISOLATED NO GROUP A STREP (S.PYOGENES) ISOLATED Performed at [facility]    Report Status 12/23/2013 FINAL  Final    Medical History: Past Medical History  Diagnosis Date  . Asthma   . Hypertension   . CHF (congestive heart failure)   . Myasthenia gravis   . Heart murmur   . Venous stasis   . DVT (deep  venous thrombosis) 2000, 2004    Not on anticoagulation    Assessment: 84 yoF with PMHx severe venous stasis ulcers presents with worsening bilateral cellulitis.  Pharmacy consulted to start vancomycin.   Note *12/21/13 vancomycin trough 23.9 on Vanc 1g IV q8h with stable SCr ~0.8  12/18 >> Zosyn x 1 12/18 >> Vancomycin >>    Tmax: AF WBCs: Elevated 26.9K Renal: SCr 1.40 - elevated, CrCl ~50 ml/min (N and CG)  12/18 blood: collected  Goal of Therapy:  Vancomycin trough level 10-15 mcg/ml  Plan:  Vancomycin 1500mg  IV q24h F/u renal fxn, clinical course  Ralene Bathe, PharmD, BCPS 01/11/2014, 10:21 PM  Pager: [phone]

## 2014-01-11 NOTE — ED Notes (Signed)
Bed: CX50 Expected date:  Expected time:  Means of arrival:  Comments: EMS 36 F open wounds/cellulitis

## 2014-01-11 NOTE — Progress Notes (Signed)
CSW met with pt's home health care nurse upon request. Pt's nurse stated that the patient has a filthy house and that  She has various wounds and cellulitius. CSW will call APS regarding situation.  CSW met with patient at bedside. There was no family present. CSW asked patient about life at home. Patient states that is able to do her own ADL's and that her neighbor often goes to the grocery store for her. Patients statement contradicts the information from her Home health care nurse who states she does not do chores or is able to take care of herself.  CSW spoke with patient and asked her about the possibility of a SNF. However, patient refused and stated that she does not like SNF facilities.   Willette Brace [phone] ED CSW 01/11/2014 11:41 PM

## 2014-01-11 NOTE — H&P (Signed)
[facility] History and Physical  [name] LEX:[id number] DOB: [birth year] DOA: 01/11/2014  Referring physician: [place] PCP: MATTHEWS,MICHELLE A., MD   Chief Complaint: Leg wounds  HPI: [name] is a 53 y.o. female  Chronic LE wounds and leg swelling. Receives in home nursing care. Ran out of wound care supplies 1 week ago. Over that time there has been progressive pain, redness and swelling and discharge from open wounds. Foul odor. Getting worse. No dressing changes in 1 week. Pt unable to make it to [facility] appt on 12/21/13 due to arthritic pain. Denies Fevers, nausea, diarrhea, paliptations, CP.   Pt states [facility] told pt that her supplies were ordered but they never arrived.   Review of Systems:  Constitutional:  No weight loss, night sweats, Fevers, chills, fatigue.  HEENT:  No headaches, Difficulty swallowing,Tooth/dental problems,Sore throat,  No sneezing, itching, ear ache, nasal congestion, post nasal drip,  Cardio-vascular:  No chest pain, Orthopnea, PND, swelling in lower extremities, anasarca, dizziness, palpitations  GI:  No heartburn, indigestion, abdominal pain, nausea, vomiting, diarrhea, change in bowel habits, loss of appetite  Resp:  No shortness of breath with exertion or at rest. No excess mucus, no productive cough, No non-productive cough, No coughing up of blood.No change in color of mucus.No wheezing.No chest wall deformity  Skin:  Per HPI  GU:  no dysuria, change in color of urine, no urgency or frequency. No flank pain.  Musculoskeletal:  Per HPI.  Psych:  No change in mood or affect. No depression or anxiety. No memory loss.   Past Medical History  Diagnosis Date  . Asthma   . Hypertension   . CHF (congestive heart failure)   . Myasthenia gravis   . Heart murmur   . Venous stasis   . DVT (deep venous thrombosis) 2000, 2004    Not on anticoagulation    Past Surgical History  Procedure  Laterality Date  . Tonsillectomy    . Myomectomy    . Failed bilateral leg grafts     Social History:  reports that she has never smoked. She has never used smokeless tobacco. She reports that she does not drink alcohol or use illicit drugs.  Allergies  Allergen Reactions  . Asa [Aspirin] Shortness Of Breath, Rash and Anaphylaxis  . Cat Hair Extract Anaphylaxis  . Dilaudid [Hydromorphone Hcl] Shortness Of Breath and Palpitations  . Ibuprofen Anaphylaxis  . Tylenol [Acetaminophen] Rash, Other (See Comments) and Anaphylaxis    Other reaction(s): SHORTNESS OF BREATH Salivation and hearing loss  . Ciprofloxacin     Other reaction(s): OTHER  . Dicloxacillin     Other reaction(s): OTHER  . Erythromycin Other (See Comments)    Burn veins  . Hydromorphone     Other reaction(s): OTHER  . Indomethacin Other (See Comments)    Other reaction(s): OTHER Stroke symptoms  . Naproxen Other (See Comments)    Other reaction(s): OTHER Renal failure  . Nsaids Other (See Comments)    Kidney failure   . Tramadol     "stroke"  . Alginate-Collagen [Wound Dressings] Rash  . Gold-Containing Drug Products Rash  . Silvadene [Silver Sulfadiazine] Rash    Family History  Problem Relation Age of Onset  . Depression    . Cancer Mother     breast  . CVA Father   . Atrial fibrillation Mother   . Stroke Mother      Prior to Admission medications   Medication Sig  Start Date End Date Taking? Authorizing Provider  albuterol (PROVENTIL HFA;VENTOLIN HFA) 108 (90 BASE) MCG/ACT inhaler Inhale 4 puffs into the lungs every 6 (six) hours as needed. For shortness of breath   Yes Historical Provider, MD  COD LIVER OIL PO Take 2 capsules by mouth daily.   Yes Historical Provider, MD  diphenhydrAMINE (BENADRYL) 25 MG tablet Take 1 tablet (25 mg total) by mouth every 6 (six) hours as needed. Patient taking differently: Take 25 mg by mouth every 6 (six) hours as needed for itching or allergies.  09/09/13  Yes  Nishant Dhungel, MD  Emollient (NIVEA) cream Apply 1 application topically daily. Apply all over body   Yes Historical Provider, MD  EPINEPHrine (EPIPEN) 0.3 mg/0.3 mL DEVI Inject 0.3 mg into the muscle as needed. For allergic reaction   Yes Historical Provider, MD  ferrous sulfate 325 (65 FE) MG tablet Take 325 mg by mouth daily with breakfast.    Yes Historical Provider, MD  folic acid (FOLVITE) 132 MCG tablet Take 400 mcg by mouth daily.   Yes Historical Provider, MD  Garlic Oil 4401 MG CAPS Take 1,000 mg by mouth daily.   Yes Historical Provider, MD  hydrochlorothiazide (HYDRODIURIL) 25 MG tablet Take 1 tablet (25 mg total) by mouth daily. 12/24/13  Yes Reyne Dumas, MD  hydrocortisone cream 1 % Apply topically 2 (two) times daily. 12/24/13  Yes Reyne Dumas, MD  hydrOXYzine (ATARAX/VISTARIL) 25 MG tablet Take 1 tablet (25 mg total) by mouth every 4 (four) hours as needed for itching. 12/24/13  Yes Reyne Dumas, MD  lidocaine (XYLOCAINE) 4 % external solution Apply 5 mLs topically 4 (four) times daily. 10/15/13  Yes Leana Gamer, MD  nystatin cream (MYCOSTATIN) Apply 1 application topically 2 (two) times daily. 09/27/13  Yes Leana Gamer, MD  Omega-3 Fatty Acids (FISH OIL) 500 MG CAPS Take 1,000 mg by mouth daily.   Yes Historical Provider, MD  oxyCODONE (OXY IR/ROXICODONE) 5 MG immediate release tablet Take 1 tablet (5 mg total) by mouth every 4 (four) hours as needed for severe pain. 12/24/13  Yes Reyne Dumas, MD  Pediatric Multivit-Minerals-C (RA GUMMY VITAMINS & MINERALS PO) Take 1 tablet by mouth daily.   Yes Historical Provider, MD  pyridostigmine (MESTINON) 60 MG tablet Take 1 tablet (60 mg total) by mouth 4 (four) times daily. 10/05/13  Yes Marcial Pacas, MD   Physical Exam: Filed Vitals:   01/11/14 1949 01/11/14 2112 01/11/14 2337  BP: 144/93  126/97  Pulse: 109  99  Temp: 97.6 F (36.4 C)  98.7 F (37.1 C)  TempSrc: Oral  Oral  Resp: 18  22  Height:  5' 6.14" (1.68 m)     Weight:  85.3 kg (188 lb 0.8 oz)   SpO2: 99%  99%    Wt Readings from Last 3 Encounters:  01/11/14 85.3 kg (188 lb 0.8 oz)  12/20/13 85.276 kg (188 lb)  12/06/13 85.276 kg (188 lb)    General: Appears calm and comfortable Eyes:  PERRL, normal lids, irises & conjunctiva ENT: Dry mucus membranes  Neck:  no LAD, masses or thyromegaly Cardiovascular:  RRR, no m/r/g. 2+ bilat LE edema. Telemetry:  SR, no arrhythmias  Respiratory:  CTA bilaterally, no w/r/r. Normal respiratory effort. Abdomen:  soft, ntnd Skin: several stage II-IV decubitus ulcers on L buttock region. Legs w/ bilateral extensiv skin destruction, weeping purulent wounds, and foul odor. R leg w/ extensive erythema and ttp from the mid leg to mid  medial thigh. No crepitus.  Musculoskeletal:  grossly normal tone BUE/BLE Psychiatric:  grossly normal mood and affect, speech fluent and appropriate Neurologic:  grossly non-focal.              Labs on Admission:  Basic Metabolic Panel:  Recent Labs Lab 01/11/14 2103 01/11/14 2120  NA 137 136*  K 3.3* 3.4*  CL 95* 101  CO2 23  --   GLUCOSE 148* 148*  BUN 34* 43*  CREATININE 1.12* 1.40*  CALCIUM 9.9  --    Liver Function Tests: No results for input(s): AST, ALT, ALKPHOS, BILITOT, PROT, ALBUMIN in the last 168 hours. No results for input(s): LIPASE, AMYLASE in the last 168 hours. No results for input(s): AMMONIA in the last 168 hours. CBC:  Recent Labs Lab 01/11/14 2103 01/11/14 2120  WBC 26.9*  --   HGB 11.8* 14.3  HCT 37.1 42.0  MCV 68.5*  --   PLT PLATELET CLUMPS NOTED ON SMEAR, COUNT APPEARS ADEQUATE  --    Cardiac Enzymes: No results for input(s): CKTOTAL, CKMB, CKMBINDEX, TROPONINI in the last 168 hours.  BNP (last 3 results) No results for input(s): PROBNP in the last 8760 hours. CBG: No results for input(s): GLUCAP in the last 168 hours.  Radiological Exams on Admission: No results found.  EKG: pending  Assessment/Plan Principal  Problem:   Wound infection Active Problems:   Hypokalemia   Myasthenia gravis   Venous stasis   AKI (acute kidney injury)   Cellulitis   Sepsis   Lactic acidosis  53 year old female with a history of myasthenia gravis, chronic venous stasis ulcers, history of DVT hypertension present w/ extensive non-healing wounds and cellulitis  Wound infections: Chronic ongoing issue for pt. Long h/o treatment at [facility]. Pt missed wound care appt this week and was not getting proper wound care supplies at home. Wounds cleaned and dressed in ED. Lactic acid 3.53. WBC 26.9  - Admit - f/u BCX - f/u WCX - Vanc/Zos - Wound care consult - Consider Ortho consult (pulses present but unsure if limbs are salvageable in long run). - Social work consult to assist w/ home care needs - consider lasix to help w/ LE edema as this is impeding healing (wait for morning Cr as currently in AKI)  AKI: Cr 1.4, baseline 1.1. Pt dehydrated - IVF NS 157ml/hr - BMET in am  HypoKalemia: 3.3 on admission - Kdur 21mEq  HTN:  noromotensive - continue HCTZ  Myasthenia gravis:  - continue home Mestinon  Code Status: FULL DVT Prophylaxis: Hep Family Communication: none Disposition Plan: Pending improveemtn  MERRELL, Iva [facility] [email or web address] Password [email or web address]

## 2014-01-11 NOTE — ED Notes (Signed)
MD at bedside. 

## 2014-01-12 ENCOUNTER — Encounter (HOSPITAL_COMMUNITY): Payer: Self-pay | Admitting: *Deleted

## 2014-01-12 DIAGNOSIS — T798XXD Other early complications of trauma, subsequent encounter: Secondary | ICD-10-CM

## 2014-01-12 DIAGNOSIS — A419 Sepsis, unspecified organism: Principal | ICD-10-CM

## 2014-01-12 DIAGNOSIS — I10 Essential (primary) hypertension: Secondary | ICD-10-CM

## 2014-01-12 LAB — COMPREHENSIVE METABOLIC PANEL
ALT: 22 U/L (ref 0–35)
AST: 29 U/L (ref 0–37)
Albumin: 2 g/dL — ABNORMAL LOW (ref 3.5–5.2)
Alkaline Phosphatase: 103 U/L (ref 39–117)
Anion gap: 11 (ref 5–15)
BILIRUBIN TOTAL: 0.4 mg/dL (ref 0.3–1.2)
BUN: 34 mg/dL — ABNORMAL HIGH (ref 6–23)
CALCIUM: 8.6 mg/dL (ref 8.4–10.5)
CHLORIDE: 98 meq/L (ref 96–112)
CO2: 26 meq/L (ref 19–32)
Creatinine, Ser: 1.11 mg/dL — ABNORMAL HIGH (ref 0.50–1.10)
GFR calc Af Amer: 64 mL/min — ABNORMAL LOW (ref >90)
GFR, EST NON AFRICAN AMERICAN: 55 mL/min — AB (ref >90)
Glucose, Bld: 134 mg/dL — ABNORMAL HIGH (ref 70–99)
Potassium: 2.5 mEq/L — CL (ref 3.7–5.3)
SODIUM: 135 meq/L — AB (ref 137–147)
Total Protein: 5.4 g/dL — ABNORMAL LOW (ref 6.0–8.3)

## 2014-01-12 LAB — MRSA PCR SCREENING: MRSA by PCR: NEGATIVE

## 2014-01-12 LAB — CBC
HCT: 30.5 % — ABNORMAL LOW (ref 36.0–46.0)
Hemoglobin: 9.8 g/dL — ABNORMAL LOW (ref 12.0–15.0)
MCH: 21.6 pg — ABNORMAL LOW (ref 26.0–34.0)
MCHC: 31.8 g/dL (ref 30.0–36.0)
MCV: 67.9 fL — ABNORMAL LOW (ref 78.0–100.0)
PLATELETS: 339 10*3/uL (ref 150–400)
RBC: 4.49 MIL/uL (ref 3.87–5.11)
RDW: 26.1 % — ABNORMAL HIGH (ref 11.5–15.5)
WBC: 19.9 10*3/uL — ABNORMAL HIGH (ref 4.0–10.5)

## 2014-01-12 LAB — MAGNESIUM: Magnesium: 1.9 mg/dL (ref 1.5–2.5)

## 2014-01-12 MED ORDER — PIPERACILLIN-TAZOBACTAM 3.375 G IVPB
3.3750 g | Freq: Three times a day (TID) | INTRAVENOUS | Status: DC
  Administered 2014-01-12 – 2014-01-16 (×12): 3.375 g via INTRAVENOUS
  Filled 2014-01-12 (×15): qty 50

## 2014-01-12 MED ORDER — POTASSIUM CHLORIDE CRYS ER 20 MEQ PO TBCR
40.0000 meq | EXTENDED_RELEASE_TABLET | Freq: Two times a day (BID) | ORAL | Status: AC
  Administered 2014-01-12 (×2): 40 meq via ORAL
  Filled 2014-01-12 (×2): qty 2

## 2014-01-12 MED ORDER — POTASSIUM CHLORIDE IN NACL 40-0.9 MEQ/L-% IV SOLN
INTRAVENOUS | Status: DC
  Administered 2014-01-12 – 2014-01-13 (×3): 100 mL/h via INTRAVENOUS
  Filled 2014-01-12 (×7): qty 1000

## 2014-01-12 MED ORDER — VANCOMYCIN HCL IN DEXTROSE 1-5 GM/200ML-% IV SOLN
1000.0000 mg | Freq: Two times a day (BID) | INTRAVENOUS | Status: DC
  Administered 2014-01-12 – 2014-01-14 (×5): 1000 mg via INTRAVENOUS
  Filled 2014-01-12 (×7): qty 200

## 2014-01-12 NOTE — Progress Notes (Signed)
Progress Note   [name] [id number]:[id number] DOB: [birth year] DOA: 01/11/2014 PCP: MATTHEWS,MICHELLE A., MD   Brief Narrative:   [name] is an 53 y.o. female with a PMH of chronic lower extremity wounds who was admitted 01/11/14 with lower extremity wound infection in the setting of having run out of her dressing supplies and not changing them for one week.  Assessment/Plan:   Principal Problem:  Sepsis secondary to Wound infection in the setting of venous stasis / cellulitis / lactic acidosis  Continue empiric antibiotics with vancomycin/Zosyn.  Wound care consultation pending.  Follow-up blood cultures/wound culture.  May need orthopedic or plastic surgical consultation for debridement.  Will await WOC RN recommendations.  Active Problems:  Hypokalemia  Given oral supplementation on admission.   Myasthenia gravis  Continue home Mestinon.   AKI (acute kidney injury)  Baseline creatinine 1.1. Admission creatinine 1.4. Started on IV fluids. Creatinine now back to usual baseline values.  Code Status: FULL DVT Prophylaxis: Heparin Family Communication: No family at the bedside. Disposition Plan: Home vs. SNF.   IV Access:    Peripheral IV   Procedures and diagnostic studies:    None.  Medical Consultants:    None.  Anti-Infectives:    Vancomycin 01/11/14--->  Zosyn 01/11/14--->  Subjective:   [name] has some bilateral leg pain. No nausea or vomiting. Appetite good.  Objective:    Filed Vitals:   01/11/14 2112 01/11/14 2337 01/12/14 0004 01/12/14 0600  BP:  126/97 138/66 93/43  Pulse:  99 85 116  Temp:  98.7 F (37.1 C)  98.5 F (36.9 C)  TempSrc:  Oral  Oral  Resp:  22 20 20   Height: 5' 6.14" (1.68 m)     Weight: 85.3 kg (188 lb 0.8 oz)     SpO2:  99% 85% 99%    Intake/Output Summary (Last 24 hours) at 01/12/14 0839 Last data filed at 01/12/14 0700  Gross per 24 hour  Intake 1151.67 ml  Output      0  ml  Net 1151.67 ml    Exam: Gen:  NAD Cardiovascular:  RRR, No M/R/G Respiratory:  Lungs CTAB Gastrointestinal:  Abdomen soft, NT/ND, + BS Extremities:  Currently dressed with foul smell evident: Photos taken on admission:        Data Reviewed:    Labs: Basic Metabolic Panel:  Recent Labs Lab 01/11/14 2103 01/11/14 2120 01/12/14 0608  NA 137 136* 135*  K 3.3* 3.4* 2.5*  CL 95* 101 98  CO2 23  --  26  GLUCOSE 148* 148* 134*  BUN 34* 43* 34*  CREATININE 1.12* 1.40* 1.11*  CALCIUM 9.9  --  8.6  MG  --   --  1.9   GFR Estimated Creatinine Clearance: 63.9 mL/min (by C-G formula based on Cr of 1.11). Liver Function Tests:  Recent Labs Lab 01/12/14 0608  AST 29  ALT 22  ALKPHOS 103  BILITOT 0.4  PROT 5.4*  ALBUMIN 2.0*   CBC:  Recent Labs Lab 01/11/14 2103 01/11/14 2120 01/12/14 0608  WBC 26.9*  --  19.9*  HGB 11.8* 14.3 9.8*  HCT 37.1 42.0 30.5*  MCV 68.5*  --  67.9*  PLT PLATELET CLUMPS NOTED ON SMEAR, COUNT APPEARS ADEQUATE  --  339   Sepsis Labs:  Recent Labs Lab 01/11/14 2103 01/11/14 2153 01/12/14 0608  WBC 26.9*  --  19.9*  LATICACIDVEN  --  3.53*  --    Microbiology Recent Results (from the  past 240 hour(s))  MRSA PCR Screening     Status: None   Collection Time: 01/12/14  2:08 AM  Result Value Ref Range Status   MRSA by PCR NEGATIVE NEGATIVE Final    Comment:        The GeneXpert MRSA Assay (FDA approved for NASAL specimens only), is one component of a comprehensive MRSA colonization surveillance program. It is not intended to diagnose MRSA infection nor to guide or monitor treatment for MRSA infections.      Medications:   . heparin  5,000 Units Subcutaneous 3 times per day  . hydrochlorothiazide  25 mg Oral Daily  . piperacillin-tazobactam (ZOSYN)  IV  3.375 g Intravenous Q8H  . potassium chloride  40 mEq Oral BID  . pyridostigmine  60 mg Oral QID  . senna  1 tablet Oral BID  . vancomycin  1,500 mg Intravenous  Q24H   Continuous Infusions: . 0.9 % NaCl with KCl 40 mEq / L      Time spent: 35 minutes.  The patient is medically complex and requires high complexity decision making.     LOS: 1 day   [name]  [facility] Pager [phone]. If unable to reach me by pager, please call my cell phone at [phone].  *Please refer to [email or web address], password [email or web address] to get updated schedule on who will round on this patient, as hospitalists switch teams weekly. If 7PM-7AM, please contact night-coverage at [email or web address], password [email or web address] for any overnight needs.  01/12/2014, 8:39 AM

## 2014-01-12 NOTE — Progress Notes (Signed)
Prn for pain given per request prior to dressing change.  1845 attempted to change  Dressings on left buttocks/ thigh. Able to change 2. Foam dressing replaced over back of thigh. 4x 4 guaze (moist to dry  1 4x4 in wound) to proximal area. Patient unable to tolerate medial dressing to be change and refuses leg dressing at this time. States uses lidocaine to remove dressings at home. Reported to night rn -Diane who will follow up.

## 2014-01-12 NOTE — Progress Notes (Signed)
[place] for Vancomycin, Zosyn Indication: Cellulitis  Allergies  Allergen Reactions  . Asa [Aspirin] Shortness Of Breath, Rash and Anaphylaxis  . Cat Hair Extract Anaphylaxis  . Dilaudid [Hydromorphone Hcl] Shortness Of Breath and Palpitations  . Ibuprofen Anaphylaxis  . Tylenol [Acetaminophen] Rash, Other (See Comments) and Anaphylaxis    Other reaction(s): SHORTNESS OF BREATH Salivation and hearing loss  . Ciprofloxacin     Other reaction(s): OTHER  . Dicloxacillin     Other reaction(s): OTHER  . Erythromycin Other (See Comments)    Burn veins  . Hydromorphone     Other reaction(s): OTHER  . Indomethacin Other (See Comments)    Other reaction(s): OTHER Stroke symptoms  . Naproxen Other (See Comments)    Other reaction(s): OTHER Renal failure  . Nsaids Other (See Comments)    Kidney failure   . Tramadol     "stroke"  . Alginate-Collagen [Wound Dressings] Rash  . Gold-Containing Drug Products Rash  . Silvadene [Silver Sulfadiazine] Rash    Patient Measurements: Height: 5' 6.14" (168 cm) Weight: 188 lb 0.8 oz (85.3 kg) IBW/kg (Calculated) : 59.63   Vital Signs: Temp: 98.5 F (36.9 C) (12/19 0600) Temp Source: Oral (12/19 0600) BP: 93/43 mmHg (12/19 0600) Pulse Rate: 116 (12/19 0600) Intake/Output from previous day: 12/18 0701 - 12/19 0700 In: 1151.7 [P.O.:480; I.V.:621.7; IV Piggyback:50] Out: -  Intake/Output from this shift:    Labs:  Recent Labs  01/11/14 2103 01/11/14 2120 01/12/14 0608  WBC 26.9*  --  19.9*  HGB 11.8* 14.3 9.8*  PLT PLATELET CLUMPS NOTED ON SMEAR, COUNT APPEARS ADEQUATE  --  339  CREATININE 1.12* 1.40* 1.11*   Estimated Creatinine Clearance: 63.9 mL/min (by C-G formula based on Cr of 1.11). No results for input(s): VANCOTROUGH, VANCOPEAK, VANCORANDOM, GENTTROUGH, GENTPEAK, GENTRANDOM, TOBRATROUGH, TOBRAPEAK, TOBRARND, AMIKACINPEAK, AMIKACINTROU, AMIKACIN in the last 72 hours.       Medical History: Past Medical History  Diagnosis Date  . Asthma   . Hypertension   . CHF (congestive heart failure)   . Myasthenia gravis   . Heart murmur   . Venous stasis   . DVT (deep venous thrombosis) 2000, 2004    Not on anticoagulation   . Fibroids     Microbiology: 12/18 blood x2: collected 12/18 leg wound: collected 12/18 wound: collected 12/19: MRSA PCR screening: negative    Anti-infectives: 12/18 >> Zosyn >> 12/18 >> Vancomycin >>     Assessment: 89 yoF with PMHx severe venous stasis ulcers presents with worsening bilateral cellulitis.  Pharmacy consulted to start vancomycin and Zosyn.  Note *12/21/13 vancomycin trough 23.9 on Vanc 1g IV q8h with stable SCr ~0.8  Goal of Therapy:  Vancomycin trough level 10-15 mcg/ml Appropriate antibiotic dosing for indication and renal function; eradication of infection.  Today, 12/19: D#2 vancomycin 1500 mg IV q24h D#2 Zosyn 3.375 grams IV q8h (extended-infusion) Afebrile WBC improving SCr improved, estimated CrCl > 60 mL/min Vancomycin level from prior regimen during previous admission noted   Plan:  1. Increase vancomycin to 1 gram IV q12h given improvement in renal function 2. Continue Zosyn 3.375 grams IV q8h (extended-infusion) 3. Follow renal function, culture results, clinical course.   4. Check vancomycin trough at steady-state unless early de-escalation is anticipated.  Clayburn Pert, PharmD, BCPS Pager: [phone] 01/12/2014  10:55 AM

## 2014-01-12 NOTE — Progress Notes (Signed)
Pt admitted from ED. Multiple wounds present. All wounds cleansed with NS by this RN upon admission to the floor.  Stg 3 pressure ulcer present to left ischial tuberosity measurements 4cmx2.25cmx2.5cm. Wet to dry dressing placed with 2 4x4 gauze. Also noted partial thickness wounds present x4. Wounds numbered by proximity. Partial thickness wound #1 on the right posterior thigh measuring 1.5cmx1cmx0cm cleansed and covered with allevyn dressing. Wound #2 of the left posterior thigh measuring 5cmx3cmx0cm cleansed and covered with allevyn dressing. Wound #3 of the left posterior thigh measuring 4cmx3cmx3cm cleansed and covered with allevyn dressing. Wound #4 of the left posterior thigh measuring 3.5x2.5cmx0cm cleansed and covered with allevyn dressing.   Pt also with venous statis ulcerations to BLE. LLE worse then RLE. Both legs redressed and covered with gauze, adb pads and kerlex wrapped. RLE with two areas open. Lateral area measures at 12cmx7cmx0.25cm and posterior area measures at 11cmc3cmxocm. LLE with open wound that reaches nearly full circumference of leg and measures 17cmx27.5cmx0cm.  Pt with unstagable wound to rt heel that has hydrocolloid dressing from home stuck inplace making measurement impossible at this time.

## 2014-01-12 NOTE — Progress Notes (Signed)
PHARMACY - ZOSYN  Brief note, as full note written earlier when pharmacy consulted to dose Vancomycin  Zosyn per pharmacy dosing requested for treatment of cellulitis/wound infection.  Patient received Zosyn 3.375gm IV x 1 in Ed @ 22:17 on 01/11/14.  CrCl ~ 50 ml/min  Plan:  Continue Vancomycin as previously ordered            Zosyn 3.375gm IV q8h (each dose infused over 4 hrs)  Leone Haven, PharmD 01/12/14 @ 00:24

## 2014-01-12 NOTE — Progress Notes (Addendum)
CRITICAL VALUE ALERT  Critical value received:  Potassium 2.5  Date of notification:  01/12/14  Time of notification:  0730  Critical value read back:Yes.    Nurse who received alert:  Claiborne Billings, RN   MD notified (1st page):  Rama  Time of first page:  0750  MD notified (2nd page): Rama  Time of second page: [phone]  Responding MD:    Time MD responded:  Awaiting  This RN has given report to following shift and they will follow up with critical lab value at this time

## 2014-01-13 LAB — CBC
HCT: 31 % — ABNORMAL LOW (ref 36.0–46.0)
Hemoglobin: 9.8 g/dL — ABNORMAL LOW (ref 12.0–15.0)
MCH: 21.9 pg — AB (ref 26.0–34.0)
MCHC: 31.6 g/dL (ref 30.0–36.0)
MCV: 69.2 fL — AB (ref 78.0–100.0)
PLATELETS: 277 10*3/uL (ref 150–400)
RBC: 4.48 MIL/uL (ref 3.87–5.11)
RDW: 26.3 % — ABNORMAL HIGH (ref 11.5–15.5)
WBC: 12.4 10*3/uL — ABNORMAL HIGH (ref 4.0–10.5)

## 2014-01-13 LAB — BASIC METABOLIC PANEL
Anion gap: 8 (ref 5–15)
BUN: 30 mg/dL — ABNORMAL HIGH (ref 6–23)
CO2: 24 meq/L (ref 19–32)
CREATININE: 0.96 mg/dL (ref 0.50–1.10)
Calcium: 8.4 mg/dL (ref 8.4–10.5)
Chloride: 104 mEq/L (ref 96–112)
GFR calc Af Amer: 76 mL/min — ABNORMAL LOW (ref >90)
GFR calc non Af Amer: 66 mL/min — ABNORMAL LOW (ref >90)
GLUCOSE: 98 mg/dL (ref 70–99)
Potassium: 3.8 mEq/L (ref 3.7–5.3)
Sodium: 136 mEq/L — ABNORMAL LOW (ref 137–147)

## 2014-01-13 LAB — FERRITIN: Ferritin: 77 ng/mL (ref 10–291)

## 2014-01-13 LAB — WOUND CULTURE

## 2014-01-13 MED ORDER — ALUM & MAG HYDROXIDE-SIMETH 200-200-20 MG/5ML PO SUSP
30.0000 mL | ORAL | Status: DC | PRN
  Administered 2014-01-13: 30 mL via ORAL
  Filled 2014-01-13: qty 30

## 2014-01-13 MED ORDER — LIDOCAINE HCL 2 % EX GEL
1.0000 "application " | Freq: Once | CUTANEOUS | Status: DC
  Filled 2014-01-13: qty 5

## 2014-01-13 NOTE — Progress Notes (Signed)
Progress Note   [name] QVZ:[id number] DOB: 16-Oct-1960 DOA: 01/11/2014 PCP: MATTHEWS,MICHELLE A., MD   Brief Narrative:   [name] is an 53 y.o. female with a PMH of chronic lower extremity wounds who was admitted 01/11/14 with lower extremity wound infection in the setting of having run out of her dressing supplies and not changing them for one week.  Assessment/Plan:   Principal Problem:  Sepsis secondary to Wound infection in the setting of venous stasis / cellulitis / lactic acidosis  Continue empiric antibiotics with vancomycin/Zosyn.  WBC decreasing.  Wound care consultation pending.  Follow-up blood cultures/wound culture.  May need orthopedic or plastic surgical consultation for debridement.  Will await WOC RN recommendations.  Active Problems:   Microcytic anemia  Iron studies done 12/06/13. Ferritin not done, so will check.   Hypokalemia  Corrected with supplementation.   Myasthenia gravis  Continue home Mestinon.   AKI (acute kidney injury)  Baseline creatinine 1.1. Admission creatinine 1.4. Started on IV fluids. Creatinine now back to usual baseline values.  Code Status: FULL DVT Prophylaxis: Heparin Family Communication: No family at the bedside. Disposition Plan: Home vs. SNF.   IV Access:    Peripheral IV   Procedures and diagnostic studies:    None.  Medical Consultants:    None.  Anti-Infectives:    Vancomycin 01/11/14--->  Zosyn 01/11/14--->  Subjective:   [name] has some bilateral leg pain. No nausea or vomiting. Appetite remains good.  Objective:    Filed Vitals:   01/12/14 0600 01/12/14 1400 01/12/14 2124 01/13/14 0725  BP: 93/43 132/72 114/65 108/48  Pulse: 116 98 93 79  Temp: 98.5 F (36.9 C) 98.5 F (36.9 C) 98.6 F (37 C) 97.2 F (36.2 C)  TempSrc: Oral Oral Oral Oral  Resp: 20 20 20 18   Height:      Weight:      SpO2: 99% 99% 99% 100%    Intake/Output Summary (Last  24 hours) at 01/13/14 3295 Last data filed at 01/13/14 0600  Gross per 24 hour  Intake   2175 ml  Output      0 ml  Net   2175 ml    Exam: Gen:  NAD Cardiovascular:  RRR, No M/R/G Respiratory:  Lungs CTAB Gastrointestinal:  Abdomen soft, NT/ND, + BS Extremities:  Currently dressed with foul smell evident: Photos taken on admission:        Data Reviewed:    Labs: Basic Metabolic Panel:  Recent Labs Lab 01/11/14 2103 01/11/14 2120 01/12/14 0608 01/13/14 0605  NA 137 136* 135* 136*  K 3.3* 3.4* 2.5* 3.8  CL 95* 101 98 104  CO2 23  --  26 24  GLUCOSE 148* 148* 134* 98  BUN 34* 43* 34* 30*  CREATININE 1.12* 1.40* 1.11* 0.96  CALCIUM 9.9  --  8.6 8.4  MG  --   --  1.9  --    GFR Estimated Creatinine Clearance: 73.9 mL/min (by C-G formula based on Cr of 0.96). Liver Function Tests:  Recent Labs Lab 01/12/14 0608  AST 29  ALT 22  ALKPHOS 103  BILITOT 0.4  PROT 5.4*  ALBUMIN 2.0*   CBC:  Recent Labs Lab 01/11/14 2103 01/11/14 2120 01/12/14 0608 01/13/14 0605  WBC 26.9*  --  19.9* 12.4*  HGB 11.8* 14.3 9.8* 9.8*  HCT 37.1 42.0 30.5* 31.0*  MCV 68.5*  --  67.9* 69.2*  PLT PLATELET CLUMPS NOTED ON SMEAR, COUNT APPEARS ADEQUATE  --  339 277   Sepsis Labs:  Recent Labs Lab 01/11/14 2103 01/11/14 2153 01/12/14 0608 01/13/14 0605  WBC 26.9*  --  19.9* 12.4*  LATICACIDVEN  --  3.53*  --   --    Microbiology Recent Results (from the past 240 hour(s))  Wound culture     Status: None (Preliminary result)   Collection Time: 01/11/14  9:54 PM  Result Value Ref Range Status   Specimen Description WOUND L LEG  Final   Special Requests Normal  Final   Gram Stain   Final    RARE WBC PRESENT, PREDOMINANTLY MONONUCLEAR NO SQUAMOUS EPITHELIAL CELLS SEEN ABUNDANT GRAM NEGATIVE RODS Performed at [facility]    Culture PENDING  Incomplete   Report Status PENDING  Incomplete  Wound culture     Status: None (Preliminary result)   Collection  Time: 01/11/14 10:19 PM  Result Value Ref Range Status   Specimen Description WOUND R LEG  Final   Special Requests NONE  Final   Gram Stain   Final    FEW WBC PRESENT,BOTH PMN AND MONONUCLEAR RARE SQUAMOUS EPITHELIAL CELLS PRESENT ABUNDANT GRAM NEGATIVE RODS Performed at [facility]    Culture PENDING  Incomplete   Report Status PENDING  Incomplete  MRSA PCR Screening     Status: None   Collection Time: 01/12/14  2:08 AM  Result Value Ref Range Status   MRSA by PCR NEGATIVE NEGATIVE Final    Comment:        The GeneXpert MRSA Assay (FDA approved for NASAL specimens only), is one component of a comprehensive MRSA colonization surveillance program. It is not intended to diagnose MRSA infection nor to guide or monitor treatment for MRSA infections.      Medications:   . heparin  5,000 Units Subcutaneous 3 times per day  . hydrochlorothiazide  25 mg Oral Daily  . piperacillin-tazobactam (ZOSYN)  IV  3.375 g Intravenous Q8H  . pyridostigmine  60 mg Oral QID  . senna  1 tablet Oral BID  . vancomycin  1,000 mg Intravenous Q12H   Continuous Infusions: . 0.9 % NaCl with KCl 40 mEq / L 100 mL/hr (01/12/14 2323)    Time spent: 25 minutes.     LOS: 2 days   RAMA,CHRISTINA  [facility] Pager [phone]. If unable to reach me by pager, please call my cell phone at [phone].  *Please refer to [email or web address], password [email or web address] to get updated schedule on who will round on this patient, as hospitalists switch teams weekly. If 7PM-7AM, please contact night-coverage at [email or web address], password [email or web address] for any overnight needs.  01/13/2014, 8:03 AM

## 2014-01-14 LAB — WOUND CULTURE: Special Requests: NORMAL

## 2014-01-14 MED ORDER — SODIUM CHLORIDE 0.45 % IV SOLN: INTRAVENOUS | Status: DC

## 2014-01-14 NOTE — Progress Notes (Signed)
Progress Note   [name] CHY:[id number] DOB: [birth year] DOA: 01/11/2014 PCP: [name] A., MD   Brief Narrative:   [name] is an 53 y.o. female with a PMH of chronic lower extremity wounds who was admitted 01/11/14 with lower extremity wound infection in the setting of having run out of her dressing supplies and not changing them for one week.  Assessment/Plan:   Principal Problem:  Sepsis secondary to bilateral lower extremity wound infection/cellulitis in the setting of venous stasis with associated lactic acidosis  Continue empiric antibiotics with vancomycin/Zosyn.  WBC decreasing.  Wound care consultation pending.  Follow-up blood cultures.  No strep or staph isolated from surface wound cultures.  May need orthopedic or plastic surgical consultation for debridement.  Will await WOC RN recommendations.  Active Problems:   Microcytic anemia  Iron studies done 12/06/13. Ferritin 77. These findings are consistent with anemia of chronic disease/inflammation.   Hypokalemia  Corrected with supplementation.   Myasthenia gravis  Continue home Mestinon.   AKI (acute kidney injury)  Baseline creatinine 1.1. Admission creatinine 1.4. Started on IV fluids. Creatinine now back to usual baseline values.  Code Status: FULL DVT Prophylaxis: Heparin Family Communication: No family at the bedside. Disposition Plan: Home vs. SNF.   IV Access:    Peripheral IV   Procedures and diagnostic studies:    None.  Medical Consultants:    None.  Anti-Infectives:    Vancomycin 01/11/14--->  Zosyn 01/11/14--->  Subjective:   [name] has some ongoing bilateral leg pain. No nausea or vomiting. Appetite remains good.  Reports itchy skin.  Objective:    Filed Vitals:   01/13/14 0725 01/13/14 1400 01/13/14 2106 01/14/14 0512  BP: 108/48 133/66 118/55 137/69  Pulse: 79 86 86 86  Temp: 97.2 F (36.2 C) 97.6 F (36.4 C) 97.3 F  (36.3 C) 98.8 F (37.1 C)  TempSrc: Oral Oral Oral Oral  Resp: 18 18 16 16   Height:      Weight:      SpO2: 100% 92% 99% 97%    Intake/Output Summary (Last 24 hours) at 01/14/14 0802 Last data filed at 01/14/14 0600  Gross per 24 hour  Intake   2890 ml  Output      0 ml  Net   2890 ml    Exam: Gen:  NAD Cardiovascular:  RRR, No M/R/G Respiratory:  Lungs CTAB Gastrointestinal:  Abdomen soft, NT/ND, + BS Extremities:  Currently dressed with foul smell evident: Photos taken on admission:        Data Reviewed:    Labs: Basic Metabolic Panel:  Recent Labs Lab 01/11/14 2103 01/11/14 2120 01/12/14 0608 01/13/14 0605  NA 137 136* 135* 136*  K 3.3* 3.4* 2.5* 3.8  CL 95* 101 98 104  CO2 23  --  26 24  GLUCOSE 148* 148* 134* 98  BUN 34* 43* 34* 30*  CREATININE 1.12* 1.40* 1.11* 0.96  CALCIUM 9.9  --  8.6 8.4  MG  --   --  1.9  --    GFR Estimated Creatinine Clearance: 73.9 mL/min (by C-G formula based on Cr of 0.96). Liver Function Tests:  Recent Labs Lab 01/12/14 0608  AST 29  ALT 22  ALKPHOS 103  BILITOT 0.4  PROT 5.4*  ALBUMIN 2.0*   CBC:  Recent Labs Lab 01/11/14 2103 01/11/14 2120 01/12/14 0608 01/13/14 0605  WBC 26.9*  --  19.9* 12.4*  HGB 11.8* 14.3 9.8* 9.8*  HCT 37.1 42.0 30.5*  31.0*  MCV 68.5*  --  67.9* 69.2*  PLT PLATELET CLUMPS NOTED ON SMEAR, COUNT APPEARS ADEQUATE  --  339 277   Sepsis Labs:  Recent Labs Lab 01/11/14 2103 01/11/14 2153 01/12/14 0608 01/13/14 0605  WBC 26.9*  --  19.9* 12.4*  LATICACIDVEN  --  3.53*  --   --    Microbiology Recent Results (from the past 240 hour(s))  Culture, blood (single)     Status: None (Preliminary result)   Collection Time: 01/11/14  9:05 PM  Result Value Ref Range Status   Specimen Description BLOOD RIGHT ANTECUBITAL  Final   Special Requests BOTTLES DRAWN AEROBIC AND ANAEROBIC 3ML  Final   Culture  Setup Time   Final    01/12/2014 15:28 Performed at [facility]      Culture   Final           BLOOD CULTURE RECEIVED NO GROWTH TO DATE CULTURE WILL BE HELD FOR 5 DAYS BEFORE ISSUING A FINAL NEGATIVE REPORT Performed at [facility]    Report Status PENDING  Incomplete  Culture, blood (routine x 2)     Status: None (Preliminary result)   Collection Time: 01/11/14  9:08 PM  Result Value Ref Range Status   Specimen Description BLOOD LEFT ANTECUBITAL  Final   Special Requests BOTTLES DRAWN AEROBIC AND ANAEROBIC 5CC  Final   Culture  Setup Time   Final    01/12/2014 00:49 Performed at [facility]    Culture   Final           BLOOD CULTURE RECEIVED NO GROWTH TO DATE CULTURE WILL BE HELD FOR 5 DAYS BEFORE ISSUING A FINAL NEGATIVE REPORT Performed at [facility]    Report Status PENDING  Incomplete  Wound culture     Status: None   Collection Time: 01/11/14  9:54 PM  Result Value Ref Range Status   Specimen Description WOUND L LEG  Final   Special Requests Normal  Final   Gram Stain   Final    RARE WBC PRESENT, PREDOMINANTLY MONONUCLEAR NO SQUAMOUS EPITHELIAL CELLS SEEN ABUNDANT GRAM NEGATIVE RODS Performed at [facility]    Culture   Final    MULTIPLE ORGANISMS PRESENT, NONE PREDOMINANT Note: NO STAPHYLOCOCCUS AUREUS ISOLATED NO GROUP A STREP (S.PYOGENES) ISOLATED Performed at [facility]    Report Status 01/14/2014 FINAL  Final  Wound culture     Status: None   Collection Time: 01/11/14 10:19 PM  Result Value Ref Range Status   Specimen Description WOUND R LEG  Final   Special Requests NONE  Final   Gram Stain   Final    FEW WBC PRESENT,BOTH PMN AND MONONUCLEAR RARE SQUAMOUS EPITHELIAL CELLS PRESENT ABUNDANT GRAM NEGATIVE RODS Performed at [facility]    Culture   Final    MULTIPLE ORGANISMS PRESENT, NONE PREDOMINANT Note: NO STAPHYLOCOCCUS AUREUS ISOLATED NO GROUP A STREP (S.PYOGENES) ISOLATED Performed at [facility]    Report Status 01/13/2014 FINAL  Final  MRSA PCR  Screening     Status: None   Collection Time: 01/12/14  2:08 AM  Result Value Ref Range Status   MRSA by PCR NEGATIVE NEGATIVE Final    Comment:        The GeneXpert MRSA Assay (FDA approved for NASAL specimens only), is one component of a comprehensive MRSA colonization surveillance program. It is not intended to diagnose MRSA infection nor to guide or monitor treatment  for MRSA infections.      Medications:   . heparin  5,000 Units Subcutaneous 3 times per day  . hydrochlorothiazide  25 mg Oral Daily  . lidocaine  1 application Topical Once  . piperacillin-tazobactam (ZOSYN)  IV  3.375 g Intravenous Q8H  . pyridostigmine  60 mg Oral QID  . senna  1 tablet Oral BID  . vancomycin  1,000 mg Intravenous Q12H   Continuous Infusions: . 0.9 % NaCl with KCl 40 mEq / L 100 mL/hr (01/13/14 1000)    Time spent: 25 minutes.     LOS: 3 days   [facility] Pager [phone]. If unable to reach me by pager, please call my cell phone at [phone].  *Please refer to [email or web address], password [email or web address] to get updated schedule on who will round on this patient, as hospitalists switch teams weekly. If 7PM-7AM, please contact night-coverage at [email or web address], password [email or web address] for any overnight needs.  01/14/2014, 8:02 AM

## 2014-01-14 NOTE — Clinical Documentation Improvement (Signed)
ED note states pt presents 'with worsening wound infection to her bilateral lower extremities over the past 1 week' and 'cellulitis extending to bilateral feet'; later in ED note under final diagnoses 'cellulitis of lower extremity, unspecified laterality' is documented. Progress notes do not specify.    Please clarify the conflicting documentation and document if pt's cellulitis / infection involves bilateral lower extremities.    Thank you, Mateo Flow, RN [phone] Clinical Documentation Specialist

## 2014-01-14 NOTE — Evaluation (Signed)
Physical Therapy Evaluation Patient Details Name: [name] MRN: [id number] DOB: 09-Oct-1960 Today's Date: 01/14/2014   History of Present Illness  53 year old female with a history of myasthenia gravis,  RA, chronic venous stasis ulcers, history of DVT hypertension admitted with Chronic LE wounds and leg swelling. Receives in home nursing care. Ran out of wound care supplies 1 week ago. Over that time there has been progressive pain, redness and swelling and discharge from open wounds. Foul odor. Getting worse. No dressing changes in 1 week. Pt had recent admission for a fall and BLE infections.   Clinical Impression  *Pt admitted with above diagnosis. Pt currently with functional limitations due to the deficits listed below (see PT Problem List). ** Pt will benefit from skilled PT to increase their independence and safety with mobility to allow discharge to the venue listed below.    +2 assist for OOB to recliner from elevated bed. Encouraged pt to consider ST-SNF for post acute rehab.  **    Follow Up Recommendations SNF    Equipment Recommendations  Wheelchair (measurements PT);Wheelchair cushion (measurements PT);Other (comment)    Recommendations for Other Services OT consult     Precautions / Restrictions        Mobility  Bed Mobility Overal bed mobility: +2 for physical assistance Bed Mobility: Supine to Sit     Supine to sit: Mod assist     General bed mobility comments: mod A to pivot hips to EOB, bed flat  Transfers Overall transfer level: Needs assistance Equipment used: Standard walker Transfers: Sit to/from Stand;Stand Pivot Transfers Sit to Stand: From elevated surface;+2 physical assistance;Mod assist;+2 safety/equipment Stand pivot transfers: +2 safety/equipment;+2 physical assistance;Min assist       General transfer comment: assist to rise, pt pushed up on RW, trunk/neck significantly flexed in standing  Ambulation/Gait     Assistive device:  Rolling walker (2 wheeled)       General Gait Details: NT-pt stated she was in too much pain in BLEs to attempt ambulation  Stairs            Wheelchair Mobility    Modified Rankin (Stroke Patients Only)       Balance     Sitting balance-Leahy Scale: Fair       Standing balance-Leahy Scale: Poor                               Pertinent Vitals/Pain Pain Score: 9  Pain Location: BLEs and buttocks Pain Descriptors / Indicators: Sore Pain Intervention(s): Limited activity within patient's tolerance;Monitored during session;Premedicated before session    Home Living Family/patient expects to be discharged to:: Private residence Living Arrangements: Alone   Type of Home: Apartment Home Access: Stairs to enter Entrance Stairs-Rails: None Entrance Stairs-Number of Steps: 2 Home Layout: One level Home Equipment: Walker - 2 wheels;Bedside commode;Hospital bed Additional Comments: pt stated she has a broken WC and needs a new one, would like a scooter    Prior Function Level of Independence: Independent with assistive device(s);Needs assistance   Gait / Transfers Assistance Needed: pt stated she uses RW as needed  ADL's / Homemaking Assistance Needed: sponge bathes at sink independently  Comments: [facility] for transportation, had home health wound care     Hand Dominance        Extremity/Trunk Assessment               Lower Extremity Assessment: Generalized weakness RLE Deficits /  Details: 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, L knee extension -4/5    Cervical / Trunk Assessment: Kyphotic  Communication   Communication: No difficulties  Cognition Arousal/Alertness: Awake/alert Behavior During Therapy: WFL for tasks assessed/performed Overall Cognitive Status: Within Functional Limits for tasks assessed                      General Comments      Exercises        Assessment/Plan    PT Assessment Patient needs  continued PT services  PT Diagnosis Difficulty walking;Acute pain;Generalized weakness   PT Problem List Decreased activity tolerance;Pain;Decreased balance;Decreased mobility;Decreased safety awareness  PT Treatment Interventions Gait training;Functional mobility training;Therapeutic activities;Patient/family education;Therapeutic exercise;Balance training   PT Goals (Current goals can be found in the Care Plan section) Acute Rehab PT Goals Patient Stated Goal: to return home PT Goal Formulation: With patient Time For Goal Achievement: 01/28/14 Potential to Achieve Goals: Fair    Frequency Min 3X/week   Barriers to discharge Decreased caregiver support lives alone    Co-evaluation PT/OT/SLP Co-Evaluation/Treatment: Yes Reason for Co-Treatment: Complexity of the patient's impairments (multi-system involvement);For patient/therapist safety PT goals addressed during session: Mobility/safety with mobility;Proper use of DME         End of Session   Activity Tolerance: Patient limited by pain Patient left: in chair;with call bell/phone within reach Nurse Communication: Mobility status         Time: 1027-2536 PT Time Calculation (min) (ACUTE ONLY): 20 min   Charges:   PT Evaluation $Initial PT Evaluation Tier I: 1 Procedure PT Treatments $Therapeutic Activity: 8-22 mins   PT G Codes:          Philomena Doheny 01/14/2014, 1:20 PM  [phone]

## 2014-01-14 NOTE — Progress Notes (Signed)
CSW spoke with Juanda Crumble from [facility] and gave him information for the APS report.  Willette Brace [phone] ED CSW 01/14/2014 7:48 PM

## 2014-01-14 NOTE — Evaluation (Signed)
Occupational Therapy Evaluation Patient Details Name: [name] MRN: [id number] DOB: February 26, 1960 Today's Date: 01/14/2014    History of Present Illness 53 year old female with a history of myasthenia gravis,  RA, chronic venous stasis ulcers, history of DVT hypertension admitted with Chronic LE wounds and leg swelling. Receives in home nursing care. Ran out of wound care supplies 1 week ago. Over that time there has been progressive pain, redness and swelling and discharge from open wounds. Foul odor. Getting worse. No dressing changes in 1 week.   Clinical Impression   Pt reports she could perform her self care and transfers independently prior to admission.  She requires assist for LB ADL at bed level and for bed mobility.  Pt needed +2 assist for pivot transfer to chair.  Recommended pt consider short term rehab in SNF prior to return home.  Pt with poor insight into the level of assistance she requires to be safe at home.  Will follow.    Follow Up Recommendations  SNF    Equipment Recommendations  None recommended by OT    Recommendations for Other Services       Precautions / Restrictions Precautions Precautions: Fall Restrictions Weight Bearing Restrictions: No Other Position/Activity Restrictions: pressure area R heel, fused R knee      Mobility Bed Mobility Overal bed mobility: +2 for physical assistance Bed Mobility: Supine to Sit     Supine to sit: Mod assist     General bed mobility comments: mod A to pivot hips to EOB, bed flat  Transfers Overall transfer level: Needs assistance Equipment used: Standard walker Transfers: Sit to/from Stand;Stand Pivot Transfers Sit to Stand: From elevated surface;+2 physical assistance;Mod assist;+2 safety/equipment Stand pivot transfers: +2 safety/equipment;+2 physical assistance;Min assist       General transfer comment: assist to rise, pt pushed up on RW, trunk/neck significantly flexed in standing    Balance      Sitting balance-Leahy Scale: Fair       Standing balance-Leahy Scale: Poor                              ADL Overall ADL's : Needs assistance/impaired Eating/Feeding: Set up;Sitting   Grooming: Set up;Sitting;Oral care   Upper Body Bathing: Set up;Sitting   Lower Body Bathing: Moderate assistance;Sitting/lateral leans   Upper Body Dressing : Set up;Sitting   Lower Body Dressing: Moderate assistance;Sitting/lateral leans   Toilet Transfer: +2 for physical assistance;Minimal assistance;Stand-pivot;BSC;RW           Functional mobility during ADLs:  (non-ambulatory) General ADL Comments: pt able to long sit in bed and donn R sock, but not L     Vision                 Additional Comments: wears glasses, no change from baseline   Perception     Praxis      Pertinent Vitals/Pain Pain Assessment: Faces Pain Score: 9  Faces Pain Scale: Hurts whole lot Pain Location: L buttocks, B LEs Pain Descriptors / Indicators: Sore Pain Intervention(s): Limited activity within patient's tolerance;Monitored during session;Premedicated before session;Repositioned     Hand Dominance Right   Extremity/Trunk Assessment Upper Extremity Assessment Upper Extremity Assessment: Generalized weakness (arthritic changes)   Lower Extremity Assessment Lower Extremity Assessment: Defer to PT evaluation RLE Deficits / Details: 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, L knee extension -4/5 RLE: Unable to fully assess due to pain   Cervical / Trunk Assessment  Cervical / Trunk Assessment: Kyphotic   Communication Communication Communication: No difficulties   Cognition Arousal/Alertness: Awake/alert Behavior During Therapy: WFL for tasks assessed/performed Overall Cognitive Status: Within Functional Limits for tasks assessed                     General Comments       Exercises       Shoulder Instructions      Home Living Family/patient expects to  be discharged to:: Private residence Living Arrangements: Alone   Type of Home: Apartment Home Access: Stairs to enter Entrance Stairs-Number of Steps: 2 Entrance Stairs-Rails: None Home Layout: One level               Home Equipment: Walker - 2 wheels;Bedside commode;Hospital bed   Additional Comments: pt stated she has a broken WC and needs a new one, would like a scooter      Prior Functioning/Environment Level of Independence: Independent with assistive device(s);Needs assistance  Gait / Transfers Assistance Needed: pt stated she uses RW as needed ADL's / Homemaking Assistance Needed: sponge bathes at sink independently   Comments: [facility] for transportation, had home health wound care, reported she could do dressing changes independently    OT Diagnosis: Generalized weakness;Acute pain   OT Problem List: Decreased strength;Decreased range of motion;Decreased activity tolerance;Impaired balance (sitting and/or standing);Pain   OT Treatment/Interventions: Self-care/ADL training;DME and/or AE instruction;Patient/family education    OT Goals(Current goals can be found in the care plan section) Acute Rehab OT Goals Patient Stated Goal: to return home OT Goal Formulation: With patient Time For Goal Achievement: 01/28/14 Potential to Achieve Goals: Good ADL Goals Pt Will Perform Lower Body Bathing: with set-up;sitting/lateral leans;bed level Pt Will Perform Lower Body Dressing: with set-up;sitting/lateral leans;bed level Pt Will Transfer to Toilet: with supervision;stand pivot transfer;bedside commode Pt Will Perform Toileting - Clothing Manipulation and hygiene: with supervision;sit to/from stand  OT Frequency: Min 2X/week   Barriers to D/C: Decreased caregiver support          Co-evaluation PT/OT/SLP Co-Evaluation/Treatment: Yes Reason for Co-Treatment: For patient/therapist safety;Complexity of the patient's impairments (multi-system involvement) PT goals addressed  during session: Mobility/safety with mobility;Proper use of DME OT goals addressed during session: ADL's and self-care      End of Session Equipment Utilized During Treatment: Rolling walker  Activity Tolerance: Patient limited by pain Patient left: in chair;with call bell/phone within reach   Time: 1215-1235 OT Time Calculation (min): 20 min Charges:  OT General Charges $OT Visit: 1 Procedure OT Evaluation $Initial OT Evaluation Tier I: 1 Procedure OT Treatments $Self Care/Home Management : 8-22 mins G-Codes:    Malka So 01/14/2014, 1:46 PM  [phone]

## 2014-01-14 NOTE — Consult Note (Signed)
WOC wound consult note Reason for Consult:Bilateral cellulitis to chronic venous stasis ulcers.  No wound care performed x 1 week as she states she had no wound care supplies.  Has HH services in place.  Wound type: Chronic venous stasis ulcers.  Patient states she is independent in care.  States she sometimes changes the bandage 4 times day if they begin to sting.   Left ischial ulcer, present on admission.  Stage III pressure ulcer Denuded skin to sacrum, consistent with moisture associated skin damage. MASD Pressure Ulcer POA:yes  Sacrum and left ischium Measurement:Right lateral LE: 10cm x 5cm x 0.5cm. Right posterior LE: 5cm x 4cm x 0.5cm. Left LE  circumferential ulceration: 15cm x 26cm x 0.4cm with central necrotic area measuring 4cm x 3cm.  Right heel has a 1 cm x 1.5 cm pale pink open ulceration.  Painful to touch and patient states she cannot bear weight on this leg due to this ulcer. Large, loosely adherent calloused skin was trimmed from the foot surrounding this ulcer.  Wound bed: Both wounds are 75% pale pink and very moist, 25% yellow finbrinous material. Wound bed: pink nongranulating tissue and yellow fibrinous devitalized tissue.  Drainage (amount, consistency, odor) Moderate, serosanguinous drainage.  Musty odor.  Bleeds easily with cleansing and dressing.  Periwound: Dry, intact skin.  Dressing procedure/placement/frequency: Cleanse bilateral legs with soap and water.  Apply barrier cream to moisturize periwound skin.  Place Xeroform gauze to open wounds to bilateral lower legs and right heel. Top with ABD pads (2 layers) and secure with kerlix and tape.  Change daily.   Cleanse ulcer to left ischium with NS and pat gently dry.  Apply calcium alginate to wound bed to loosely fill dead space.  Top with ABD pad.  Change Mon-Wed-Fri. Allevyn silicone border foam to sacral denuded skin for protection and to promote healing.  Change Mon-Wed-Fri Conservative sharp wound debridement  (CSWD performed at the bedside): Dead, loosely adherent callous trimmed from the right heel.

## 2014-01-14 NOTE — Progress Notes (Signed)
Changed dressings on pt's bilateral lower legs. Pt stated she was "not up" to me changing dressing on her buttocks even thought I obtained order for lidocaine gel as she requested. Will attempt again later.

## 2014-01-15 LAB — VANCOMYCIN, TROUGH: Vancomycin Tr: 26.4 ug/mL (ref 10.0–20.0)

## 2014-01-15 LAB — BASIC METABOLIC PANEL
Anion gap: 4 — ABNORMAL LOW (ref 5–15)
BUN: 17 mg/dL (ref 6–23)
CHLORIDE: 102 meq/L (ref 96–112)
CO2: 29 mmol/L (ref 19–32)
Calcium: 8.2 mg/dL — ABNORMAL LOW (ref 8.4–10.5)
Creatinine, Ser: 0.8 mg/dL (ref 0.50–1.10)
GFR calc Af Amer: >90 mL/min (ref >90)
GFR calc non Af Amer: 82 mL/min — ABNORMAL LOW (ref >90)
GLUCOSE: 98 mg/dL (ref 70–99)
POTASSIUM: 3.8 mmol/L (ref 3.5–5.1)
Sodium: 135 mmol/L (ref 135–145)

## 2014-01-15 MED ORDER — VANCOMYCIN HCL 500 MG IV SOLR
500.0000 mg | Freq: Two times a day (BID) | INTRAVENOUS | Status: DC
  Administered 2014-01-15: 500 mg via INTRAVENOUS
  Filled 2014-01-15 (×3): qty 500

## 2014-01-15 NOTE — Progress Notes (Addendum)
Clinical Social Work Department CLINICAL SOCIAL WORK PLACEMENT NOTE 01/15/2014  Patient:  [name],[name]  Account Number:  [id number] Admit date:  01/11/2014  Clinical Social Worker:  Maryln Manuel  Date/time:  01/15/2014 03:30 PM  Clinical Social Work is seeking post-discharge placement for this patient at the following level of care:   SKILLED NURSING   (*CSW will update this form in Epic as items are completed)   01/15/2014  Patient/family provided with [facility] Department of Clinical Social Work's list of facilities offering this level of care within the geographic area requested by the patient (or if unable, by the patient's family).  01/15/2014  Patient/family informed of their freedom to choose among providers that offer the needed level of care, that participate in Medicare, Medicaid or managed care program needed by the patient, have an available bed and are willing to accept the patient.  01/15/2014  Patient/family informed of [facility]' ownership interest in [facility], as well as of the fact that they are under no obligation to receive care at this facility.  PASARR submitted to EDS on 01/15/2014 PASARR number received on 01/15/2014  FL2 transmitted to all facilities in geographic area requested by pt/family on  01/15/2014 FL2 transmitted to all facilities within larger geographic area on   Patient informed that his/her managed care company has contracts with or will negotiate with  certain facilities, including the following:     Patient/family informed of bed offers received:  01/16/2014 Patient chooses bed at [facility] Physician recommends and patient chooses bed at    Patient to be transferred to  on  [employer] on 01/16/2014 Patient to be transferred to facility by ambulance Corey Harold) Patient and family notified of transfer on 01/16/2014 Name of family member notified:  Pt notified at bedside.  The following physician  request were entered in Epic:   Additional Comments:   Alison Murray, MSW, [place] Work [phone]

## 2014-01-15 NOTE — Progress Notes (Signed)
Comer Locket PharmD notified of critical vancomycin level of 26.4.  To hold 10 am dose.

## 2014-01-15 NOTE — Progress Notes (Signed)
[facility] for Vancomycin, Zosyn Indication: LE cellulitis / wound infection  Allergies  Allergen Reactions  . Asa [Aspirin] Shortness Of Breath, Rash and Anaphylaxis  . Cat Hair Extract Anaphylaxis  . Dilaudid [Hydromorphone Hcl] Shortness Of Breath and Palpitations  . Ibuprofen Anaphylaxis  . Tylenol [Acetaminophen] Rash, Other (See Comments) and Anaphylaxis    Other reaction(s): SHORTNESS OF BREATH Salivation and hearing loss  . Ciprofloxacin     Other reaction(s): OTHER  . Dicloxacillin     Other reaction(s): OTHER  . Erythromycin Other (See Comments)    Burn veins  . Hydromorphone     Other reaction(s): OTHER  . Indomethacin Other (See Comments)    Other reaction(s): OTHER Stroke symptoms  . Naproxen Other (See Comments)    Other reaction(s): OTHER Renal failure  . Nsaids Other (See Comments)    Kidney failure   . Tramadol     "stroke"  . Alginate-Collagen [Wound Dressings] Rash  . Gold-Containing Drug Products Rash  . Silvadene [Silver Sulfadiazine] Rash    Patient Measurements: Height: 5' 6.14" (168 cm) Weight: 188 lb 0.8 oz (85.3 kg) IBW/kg (Calculated) : 59.63   Vital Signs: Temp: 98.7 F (37.1 C) (12/22 0601) Temp Source: Oral (12/22 0601) BP: 132/73 mmHg (12/22 0601) Pulse Rate: 87 (12/22 0601) Intake/Output from previous day: 12/21 0701 - 12/22 0700 In: 1499.5 [P.O.:600; I.V.:649.5; IV Piggyback:250] Out: 9381 [Urine:1850; Stool:1] Intake/Output from this shift: Total I/O In: 120 [P.O.:120] Out: -   Labs:  Recent Labs  01/13/14 0605 01/15/14 0848  WBC 12.4*  --   HGB 9.8*  --   PLT 277  --   CREATININE 0.96 0.80   Estimated Creatinine Clearance: 88.7 mL/min (by C-G formula based on Cr of 0.8).  Recent Labs  01/15/14 0840  VANCOTROUGH 26.4*        Medical History: Past Medical History  Diagnosis Date  . Asthma   . Hypertension   . CHF (congestive heart failure)   . Myasthenia gravis   .  Heart murmur   . Venous stasis   . DVT (deep venous thrombosis) 2000, 2004    Not on anticoagulation   . Fibroids     Microbiology: 12/18 blood x2: NGtd 12/18 leg wound (R): mult organisms none predom; no Staph aureus or Grp A strep. 12/18 leg wound (L): mult organisms none predom; no Staph aureus or Grp A strep. 12/19: MRSA PCR screening: negative   Anti-infectives: 12/18 >> Zosyn >> 12/18 >> Vancomycin >>   Dosage Changes: Note *12/21/13 vancomycin trough 23.9 on Vanc 1g IV q8h with stable SCr ~0.8 12/19: Vancomycin changed from 1250 mg q24h to 1000 mg q12h due to improving serum creatinine.  Assessment: 19 yoF with PMHx severe venous stasis ulcers presents with worsening bilateral cellulitis.  Pharmacy was consulted to start vancomycin and Zosyn.   Goal of Therapy:  Vancomycin trough level 10-15 mcg/ml Appropriate antibiotic dosing for indication and renal function; eradication of infection.  Today, 12/22: D#5 vancomycin 1000mg  IV q12h D#5 Zosyn 3.375 grams IV q8h (extended-infusion) Afebrile WBC improving (last checked 12/20) SCr continues to improve, now WNL and at baseline Vancomycin trough supratherapeutic.   Plan:  1. Hold this AM's vancomycin dose 2. Resume vancomycin tonight with 500 mg IV q12h 3. Continue Zosyn 3.375 grams IV q8h (extended-infusion) 4. Follow renal function, culture results, clinical course.   5. Recheck vancomycin trough at steady-state if therapy continues.  Clayburn Pert, PharmD, BCPS Pager: [phone] 01/15/2014  9:52 AM

## 2014-01-15 NOTE — Progress Notes (Addendum)
Clinical Social Work Department BRIEF PSYCHOSOCIAL ASSESSMENT 01/15/2014  Patient:  [name],[name]     Account Number:  [id number]     Admit date:  01/11/2014  Clinical Social Worker:  Maryln Manuel  Date/Time:  01/15/2014 02:30 PM  Referred by:  Physician  Date Referred:  01/15/2014 Referred for  SNF Placement   Other Referral:   Interview type:  Patient Other interview type:    PSYCHOSOCIAL DATA Living Status:  ALONE Admitted from facility:   Level of care:   Primary support name:  none Primary support relationship to patient:  NONE Degree of support available:   pt did not provide emergency contact    CURRENT CONCERNS Current Concerns  Post-Acute Placement   Other Concerns:    SOCIAL WORK ASSESSMENT / PLAN CSW received referral for New SNF.    CSW reviewed chart and noted that pt was seen by ED CSW and APS referral was made due to concerns about cleanliness of pt home environment. Pt adamently declined SNF in ED.    CSW met with pt at bedside. CSW introduced self and explained role. Pt states that she lives alone. CSW discussed recommendation for SNF upon discharge. Pt stated that she is hesitant about SNF because she is on a fixed income and is concerned about the cost of SNF. CSW discussed with pt that with Medicare Part A&B insurance covers the first 20 days at 100 percent. Pt expressed understanding and states that she would be agreeable to SNF for 20 days, but would not want to remain at SNF longer due to the fact that she does not want to be billed starting day 21 and does not have a secondary insurance. Pt stated that she is hopeful that she will be able to have someone go to her apartment to clean while she is in rehab at [facility]. CSW discussed process of SNF search and pt agreeable to SNF search in [facility].    CSW discussed applying for Medicaid with pt and pt states that she has applied for Medicaid in the past and was denied. Pt discussed that she is  very familiar with insurance regulations as she worked for [employer] in the past.    [place] completed [place] and initiated SNF search to [facility].    CSW to follow up with pt regarding SNF bed offers.  Per ED CSW note, APS referral was made from ED.     CSW to continue to follow to provide support and assist with pt disposition needs.   Assessment/plan status:  Psychosocial Support/Ongoing Assessment of Needs Other assessment/ plan:   discharge planning   Information/referral to community resources:   [facility] list    PATIENT'S/FAMILY'S RESPONSE TO PLAN OF CARE: Pt alert and oriented x 4. Pt is agreeable to SNF, but has been resistant because she is concerned about cost as she reports that she cannot afford out of pocket cost for SNF. Pt agreeable to SNF search in [facility] and agreeable to stay at rehab for the 20 days that medicare will cover.   [name], MSW, [place] Work [phone]

## 2014-01-15 NOTE — Progress Notes (Signed)
Progress Note   Aliyha Ouk WYO:[id number] DOB: 06/07/1960 DOA: 01/11/2014 PCP: MATTHEWS,MICHELLE A., MD   Brief Narrative:   [name] is an 53 y.o. female with a PMH of chronic lower extremity wounds who was admitted 01/11/14 with lower extremity wound infection in the setting of having run out of her dressing supplies and not changing them for one week.  Assessment/Plan:   Principal Problem:  Sepsis secondary to bilateral lower extremity wound infection/cellulitis in the setting of venous stasis with associated lactic acidosis  Continue empiric antibiotics with vancomycin/Zosyn.  WBC decreasing.  Wound care per WOC recommendations.  Follow-up blood cultures.  No strep or staph isolated from surface wound cultures.   Will ask plastic surgery to see.  Called wound care center and spoke with RN who said that they would try to see the patient tomorrow, but that this was not something that needed to be seen emergently.  Active Problems:   Microcytic anemia  Iron studies done 12/06/13. Ferritin 77. These findings are consistent with anemia of chronic disease/inflammation.   Hypokalemia  Corrected with supplementation.   Myasthenia gravis  Continue home Mestinon.  S/P PT evaluation: SNF recommended.   AKI (acute kidney injury)  Baseline creatinine 1.1. Admission creatinine 1.4. Started on IV fluids. Creatinine now back to usual baseline values.  Code Status: FULL DVT Prophylaxis: Heparin Family Communication: No family at the bedside. Disposition Plan: Home vs. SNF.   IV Access:    Peripheral IV   Procedures and diagnostic studies:    None.  Medical Consultants:    None.  Anti-Infectives:    Vancomycin 01/11/14--->  Zosyn 01/11/14--->  Subjective:   [name] does not have any new complaints.  Not sure she will accept SNF secondary to financial concerns.  No new complaints.  Appetite good.  Still with some leg  pain.  Objective:    Filed Vitals:   01/14/14 1400 01/14/14 2154 01/15/14 0601 01/15/14 1035  BP: 157/66 132/70 132/73 142/67  Pulse: 114 103 87 87  Temp: 98.4 F (36.9 C) 99.3 F (37.4 C) 98.7 F (37.1 C) 98.1 F (36.7 C)  TempSrc: Oral Oral Oral Oral  Resp: 18 18 18 16   Height:    5\' 6"  (1.676 m)  Weight:    83.008 kg (183 lb)  SpO2: 97% 96% 96% 98%    Intake/Output Summary (Last 24 hours) at 01/15/14 1236 Last data filed at 01/15/14 0900  Gross per 24 hour  Intake  849.5 ml  Output   1851 ml  Net -1001.5 ml    Exam: Gen:  NAD Cardiovascular:  RRR, No M/R/G Respiratory:  Lungs CTAB Gastrointestinal:  Abdomen soft, NT/ND, + BS Extremities:  Currently dressed with foul smell evident: Photos taken on admission:        Data Reviewed:    Labs: Basic Metabolic Panel:  Recent Labs Lab 01/11/14 2103 01/11/14 2120 01/12/14 0608 01/13/14 0605 01/15/14 0848  NA 137 136* 135* 136* 135  K 3.3* 3.4* 2.5* 3.8 3.8  CL 95* 101 98 104 102  CO2 23  --  26 24 29   GLUCOSE 148* 148* 134* 98 98  BUN 34* 43* 34* 30* 17  CREATININE 1.12* 1.40* 1.11* 0.96 0.80  CALCIUM 9.9  --  8.6 8.4 8.2*  MG  --   --  1.9  --   --    GFR Estimated Creatinine Clearance: 87.3 mL/min (by C-G formula based on Cr of 0.8). Liver Function Tests:  Recent Labs Lab 01/12/14 0608  AST 29  ALT 22  ALKPHOS 103  BILITOT 0.4  PROT 5.4*  ALBUMIN 2.0*   CBC:  Recent Labs Lab 01/11/14 2103 01/11/14 2120 01/12/14 0608 01/13/14 0605  WBC 26.9*  --  19.9* 12.4*  HGB 11.8* 14.3 9.8* 9.8*  HCT 37.1 42.0 30.5* 31.0*  MCV 68.5*  --  67.9* 69.2*  PLT PLATELET CLUMPS NOTED ON SMEAR, COUNT APPEARS ADEQUATE  --  339 277   Sepsis Labs:  Recent Labs Lab 01/11/14 2103 01/11/14 2153 01/12/14 0608 01/13/14 0605  WBC 26.9*  --  19.9* 12.4*  LATICACIDVEN  --  3.53*  --   --    Microbiology Recent Results (from the past 240 hour(s))  Culture, blood (single)     Status: None (Preliminary  result)   Collection Time: 01/11/14  9:05 PM  Result Value Ref Range Status   Specimen Description BLOOD RIGHT ANTECUBITAL  Final   Special Requests BOTTLES DRAWN AEROBIC AND ANAEROBIC 3ML  Final   Culture  Setup Time   Final    01/12/2014 15:28 Performed at [facility]    Culture   Final           BLOOD CULTURE RECEIVED NO GROWTH TO DATE CULTURE WILL BE HELD FOR 5 DAYS BEFORE ISSUING A FINAL NEGATIVE REPORT Performed at [facility]    Report Status PENDING  Incomplete  Culture, blood (routine x 2)     Status: None (Preliminary result)   Collection Time: 01/11/14  9:08 PM  Result Value Ref Range Status   Specimen Description BLOOD LEFT ANTECUBITAL  Final   Special Requests BOTTLES DRAWN AEROBIC AND ANAEROBIC 5CC  Final   Culture  Setup Time   Final    01/12/2014 00:49 Performed at [facility]    Culture   Final           BLOOD CULTURE RECEIVED NO GROWTH TO DATE CULTURE WILL BE HELD FOR 5 DAYS BEFORE ISSUING A FINAL NEGATIVE REPORT Performed at [facility]    Report Status PENDING  Incomplete  Wound culture     Status: None   Collection Time: 01/11/14  9:54 PM  Result Value Ref Range Status   Specimen Description WOUND L LEG  Final   Special Requests Normal  Final   Gram Stain   Final    RARE WBC PRESENT, PREDOMINANTLY MONONUCLEAR NO SQUAMOUS EPITHELIAL CELLS SEEN ABUNDANT GRAM NEGATIVE RODS Performed at [facility]    Culture   Final    MULTIPLE ORGANISMS PRESENT, NONE PREDOMINANT Note: NO STAPHYLOCOCCUS AUREUS ISOLATED NO GROUP A STREP (S.PYOGENES) ISOLATED Performed at [facility]    Report Status 01/14/2014 FINAL  Final  Wound culture     Status: None   Collection Time: 01/11/14 10:19 PM  Result Value Ref Range Status   Specimen Description WOUND R LEG  Final   Special Requests NONE  Final   Gram Stain   Final    FEW WBC PRESENT,BOTH PMN AND MONONUCLEAR RARE SQUAMOUS EPITHELIAL CELLS PRESENT ABUNDANT GRAM  NEGATIVE RODS Performed at [facility]    Culture   Final    MULTIPLE ORGANISMS PRESENT, NONE PREDOMINANT Note: NO STAPHYLOCOCCUS AUREUS ISOLATED NO GROUP A STREP (S.PYOGENES) ISOLATED Performed at [facility]    Report Status 01/13/2014 FINAL  Final  MRSA PCR Screening     Status: None   Collection Time: 01/12/14  2:08 AM  Result Value  Ref Range Status   MRSA by PCR NEGATIVE NEGATIVE Final    Comment:        The GeneXpert MRSA Assay (FDA approved for NASAL specimens only), is one component of a comprehensive MRSA colonization surveillance program. It is not intended to diagnose MRSA infection nor to guide or monitor treatment for MRSA infections.      Medications:   . heparin  5,000 Units Subcutaneous 3 times per day  . hydrochlorothiazide  25 mg Oral Daily  . lidocaine  1 application Topical Once  . piperacillin-tazobactam (ZOSYN)  IV  3.375 g Intravenous Q8H  . pyridostigmine  60 mg Oral QID  . senna  1 tablet Oral BID  . vancomycin  500 mg Intravenous Q12H   Continuous Infusions: . 0.9 % NaCl with KCl 40 mEq / L 10 mL/hr (01/14/14 1033)    Time spent: 25 minutes.     LOS: 4 days   [facility] Pager [phone]. If unable to reach me by pager, please call my cell phone at [phone].  *Please refer to [email or web address], password [email or web address] to get updated schedule on who will round on this patient, as hospitalists switch teams weekly. If 7PM-7AM, please contact night-coverage at [email or web address], password [email or web address] for any overnight needs.  01/15/2014, 12:36 PM

## 2014-01-15 NOTE — Consult Note (Signed)
Reason for Consult:Chronic bilateral lower extremity venous stasis ulcerations Referring Physician: Dr. Annabell Howells [name] is an 53 y.o. female.  HPI: [name] is a 53 yo female with multiple medical issues ( hypertension, asthma, myasthenia gravis, obesity, anemia, osteoarthritis, history of thalassemia major and  rheumatoid arthritis.)  She was last evaluated by Dr. Migdalia Dk in 2013 with severe bilateral lower extremity ulcerations. She has a long history of venous stasis disease and has had multiple treatment modalities in the past inc Dermagraft, Apligraf, unna boots. She has undergone multiple attempts at skin grafting in [place], [place] which have all failed. She has been followed most recently at the [facility] wound care clinic and she reports she ran out of dressings for about 1 week or more and was  admitted with bilateral lower extremity cellulitis. She has had numerous admissions related to the ulcerations over past year. She reports last time all wounds closed 2006. Had lymphedema pump while in [place], no compression presently. States last wound care prior to admission was Coban over dressing. Has had copious drainage from wound and cannot afford to keep up with cost of dressings. Home [facility] through [facility].  Pressure ulcer of sacrum per pt only present for last 1-2 months. Has WC , no Roho cushion. Uses normal bed, no low air loss. Feels that her arthritis is what has caused her recent debilitation and pressure ulcers. Per chart allergic to alginate and collage dressings, but currently tolerating alginate to buttock ulcer.  No recent prealbumin. Not on protein supplements prior to admission.  Past Medical History  Diagnosis Date  . Asthma   . Hypertension   . CHF (congestive heart failure)   . Myasthenia gravis   . Heart murmur   . Venous stasis   . DVT (deep venous thrombosis) 2000, 2004    Not on anticoagulation   . Fibroids     Past Surgical History   Procedure Laterality Date  . Tonsillectomy    . Myomectomy    . Failed bilateral leg grafts    . Adenoidectomy      Family History  Problem Relation Age of Onset  . Depression    . Cancer Mother     breast  . CVA Father   . Atrial fibrillation Mother   . Stroke Mother     Social History:  reports that she has never smoked. She has never used smokeless tobacco. She reports that she does not drink alcohol or use illicit drugs.  Allergies:  Allergies  Allergen Reactions  . Asa [Aspirin] Shortness Of Breath, Rash and Anaphylaxis  . Cat Hair Extract Anaphylaxis  . Dilaudid [Hydromorphone Hcl] Shortness Of Breath and Palpitations  . Ibuprofen Anaphylaxis  . Tylenol [Acetaminophen] Rash, Other (See Comments) and Anaphylaxis    Other reaction(s): SHORTNESS OF BREATH Salivation and hearing loss  . Ciprofloxacin     Other reaction(s): OTHER  . Dicloxacillin     Other reaction(s): OTHER  . Erythromycin Other (See Comments)    Burn veins  . Hydromorphone     Other reaction(s): OTHER  . Indomethacin Other (See Comments)    Other reaction(s): OTHER Stroke symptoms  . Naproxen Other (See Comments)    Other reaction(s): OTHER Renal failure  . Nsaids Other (See Comments)    Kidney failure   . Tramadol     "stroke"  . Alginate-Collagen [Wound Dressings] Rash  . Gold-Containing Drug Products Rash  . Silvadene [Silver Sulfadiazine] Rash  Medications: I have reviewed the patient's current medications.  Results for orders placed or performed during the hospital encounter of 01/11/14 (from the past 48 hour(s))  Vancomycin, trough     Status: Abnormal   Collection Time: 01/15/14  8:40 AM  Result Value Ref Range   Vancomycin Tr 26.4 (HH) 10.0 - 20.0 ug/mL    Comment: REPEATED TO VERIFY CRITICAL RESULT CALLED TO, READ BACK BY AND VERIFIED WITH: JAMES,D @ [place] ON [id number] BY POTEAT,S   Basic metabolic panel     Status: Abnormal   Collection Time: 01/15/14  8:48 AM  Result  Value Ref Range   Sodium 135 135 - 145 mmol/L    Comment: Please note change in reference range.   Potassium 3.8 3.5 - 5.1 mmol/L    Comment: Please note change in reference range.   Chloride 102 96 - 112 mEq/L   CO2 29 19 - 32 mmol/L   Glucose, Bld 98 70 - 99 mg/dL   BUN 17 6 - 23 mg/dL   Creatinine, Ser 0.80 0.50 - 1.10 mg/dL   Calcium 8.2 (L) 8.4 - 10.5 mg/dL   GFR calc non Af Amer 82 (L) >90 mL/min   GFR calc Af Amer >90 >90 mL/min    Comment: (NOTE) The eGFR has been calculated using the CKD EPI equation. This calculation has not been validated in all clinical situations. eGFR's persistently <90 mL/min signify possible Chronic Kidney Disease.    Anion gap 4 (L) 5 - 15   ROS Blood pressure 126/76, pulse 95, temperature 98 F (36.7 C), temperature source Oral, resp. rate 18, height 5' 6"  (1.676 m), weight 83.008 kg (183 lb), last menstrual period 07/12/2011, SpO2 99 %. Physical Exam Alert, NAD Some TTP over right heel and left anterior leg Bilateral pitting edema to knees Resolved cellulitis Near circumferential wounds bilateral lower extremity, clean pink base with min slough. Woody induration surrounding tissue, Stage I pressure right heel Buttock exam deferred  Assessment/Plan: Chronic bilateral lower extremity venous stasis ulcerations-   Per chart review, seen by Vascular Surgery 2013. Unable to do ABI/TBI due to pain with exam. Venous US that time without evidence of treatable disease per Dr. Kellie Simmering. Presently wounds clean, no need for surgical debridement. She has failed nearly all modalities she has tried. Compliance and cost certainly a factor. Once settled in SNF vs home with home health, needs to reinstitute real compression (Coban is not sufficient) and rec lymphedema pump as well. She has large wounds and copious drainage. Can try alginate and more absorptive foam as outpatient to try to keep up with this, but she is correct in that she will likely need daily  dressing changes.   Dr. Migdalia Dk has previously discussed placement A Cell and VAC with patient. This is option, but in absence of compression, will be of limited benefit.  Her recent developement of pressure ulcer buttock concerning and rec low air loss mattress, prealbumin, PT/Rehab to aid with mobility and strength.  Irene Limbo, MD [facility] [phone]

## 2014-01-16 DIAGNOSIS — D649 Anemia, unspecified: Secondary | ICD-10-CM | POA: Diagnosis not present

## 2014-01-16 DIAGNOSIS — I87012 Postthrombotic syndrome with ulcer of left lower extremity: Secondary | ICD-10-CM | POA: Diagnosis not present

## 2014-01-16 DIAGNOSIS — L03116 Cellulitis of left lower limb: Secondary | ICD-10-CM | POA: Diagnosis not present

## 2014-01-16 DIAGNOSIS — E876 Hypokalemia: Secondary | ICD-10-CM | POA: Diagnosis not present

## 2014-01-16 DIAGNOSIS — I83018 Varicose veins of right lower extremity with ulcer other part of lower leg: Secondary | ICD-10-CM | POA: Diagnosis not present

## 2014-01-16 DIAGNOSIS — N179 Acute kidney failure, unspecified: Secondary | ICD-10-CM | POA: Diagnosis not present

## 2014-01-16 DIAGNOSIS — M6281 Muscle weakness (generalized): Secondary | ICD-10-CM | POA: Diagnosis not present

## 2014-01-16 DIAGNOSIS — A419 Sepsis, unspecified organism: Secondary | ICD-10-CM | POA: Diagnosis not present

## 2014-01-16 DIAGNOSIS — L89892 Pressure ulcer of other site, stage 2: Secondary | ICD-10-CM | POA: Diagnosis not present

## 2014-01-16 DIAGNOSIS — I10 Essential (primary) hypertension: Secondary | ICD-10-CM | POA: Diagnosis not present

## 2014-01-16 DIAGNOSIS — I872 Venous insufficiency (chronic) (peripheral): Secondary | ICD-10-CM | POA: Diagnosis not present

## 2014-01-16 DIAGNOSIS — I87011 Postthrombotic syndrome with ulcer of right lower extremity: Secondary | ICD-10-CM | POA: Diagnosis not present

## 2014-01-16 DIAGNOSIS — I890 Lymphedema, not elsewhere classified: Secondary | ICD-10-CM | POA: Diagnosis not present

## 2014-01-16 DIAGNOSIS — L03119 Cellulitis of unspecified part of limb: Secondary | ICD-10-CM | POA: Diagnosis not present

## 2014-01-16 DIAGNOSIS — L89304 Pressure ulcer of unspecified buttock, stage 4: Secondary | ICD-10-CM | POA: Diagnosis not present

## 2014-01-16 DIAGNOSIS — E669 Obesity, unspecified: Secondary | ICD-10-CM | POA: Diagnosis not present

## 2014-01-16 DIAGNOSIS — L89324 Pressure ulcer of left buttock, stage 4: Secondary | ICD-10-CM | POA: Diagnosis not present

## 2014-01-16 DIAGNOSIS — I83028 Varicose veins of left lower extremity with ulcer other part of lower leg: Secondary | ICD-10-CM | POA: Diagnosis not present

## 2014-01-16 DIAGNOSIS — J45909 Unspecified asthma, uncomplicated: Secondary | ICD-10-CM | POA: Diagnosis not present

## 2014-01-16 DIAGNOSIS — M79669 Pain in unspecified lower leg: Secondary | ICD-10-CM | POA: Diagnosis not present

## 2014-01-16 DIAGNOSIS — R6520 Severe sepsis without septic shock: Secondary | ICD-10-CM | POA: Diagnosis not present

## 2014-01-16 DIAGNOSIS — G7000 Myasthenia gravis without (acute) exacerbation: Secondary | ICD-10-CM | POA: Diagnosis not present

## 2014-01-16 LAB — BASIC METABOLIC PANEL
Anion gap: 3 — ABNORMAL LOW (ref 5–15)
BUN: 15 mg/dL (ref 6–23)
CALCIUM: 8 mg/dL — AB (ref 8.4–10.5)
CO2: 30 mmol/L (ref 19–32)
Chloride: 101 mEq/L (ref 96–112)
Creatinine, Ser: 0.91 mg/dL (ref 0.50–1.10)
GFR calc Af Amer: 81 mL/min — ABNORMAL LOW (ref >90)
GFR, EST NON AFRICAN AMERICAN: 70 mL/min — AB (ref >90)
Glucose, Bld: 118 mg/dL — ABNORMAL HIGH (ref 70–99)
POTASSIUM: 4.5 mmol/L (ref 3.5–5.1)
SODIUM: 134 mmol/L — AB (ref 135–145)

## 2014-01-16 MED ORDER — OXYCODONE HCL 5 MG PO TABS: 5.0000 mg | ORAL_TABLET | ORAL | Status: DC | PRN

## 2014-01-16 MED ORDER — DOXYCYCLINE HYCLATE 100 MG PO TABS: 100.0000 mg | ORAL_TABLET | Freq: Two times a day (BID) | ORAL | Status: DC

## 2014-01-16 MED ORDER — ALUM & MAG HYDROXIDE-SIMETH 200-200-20 MG/5ML PO SUSP: 30.0000 mL | ORAL | Status: DC | PRN

## 2014-01-16 MED ORDER — ONDANSETRON HCL 4 MG PO TABS: 4.0000 mg | ORAL_TABLET | Freq: Four times a day (QID) | ORAL | Status: DC | PRN

## 2014-01-16 NOTE — Discharge Instructions (Signed)
Sepsis °Sepsis is a serious infection of your blood or tissues that affects your whole body. The infection that causes sepsis may be bacterial, viral, fungal, or parasitic. Sepsis may be life threatening. Sepsis can cause your blood pressure to drop. This may result in shock. Shock causes your central nervous system and your organs to stop working correctly.  °RISK FACTORS °Sepsis can happen in anyone, but it is more likely to happen in people who have weakened immune systems. °SIGNS AND SYMPTOMS  °Symptoms of sepsis can include: °· Fever or low body temperature (hypothermia). °· Rapid breathing (hyperventilation). °· Chills. °· Rapid heartbeat (tachycardia). °· Confusion or light-headedness. °· Trouble breathing. °· Urinating much less than usual. °· Cool, clammy skin or red, flushed skin. °· Other problems with the heart, kidneys, or brain. °DIAGNOSIS  °Your health care provider will likely do tests to look for an infection, to see if the infection has spread to your blood, and to see how serious your condition is. Tests can include: °· Blood tests, including cultures of your blood. °· Cultures of other fluids from your body, such as: °¨ Urine. °¨ Pus from wounds. °¨ Mucus coughed up from your lungs. °· Urine tests other than cultures. °· X-ray exams or other imaging tests. °TREATMENT  °Treatment will begin with elimination of the source of infection. If your sepsis is likely caused by a bacterial or fungal infection, you will be given antibiotic or antifungal medicines. °You may also receive: °· Oxygen. °· Fluids through an IV tube. °· Medicines to increase your blood pressure. °· A machine to clean your blood (dialysis) if your kidneys fail. °· A machine to help you breathe if your lungs fail. °SEEK IMMEDIATE MEDICAL CARE IF: °You get an infection or develop any of the signs and symptoms of sepsis after surgery or a hospitalization. °Document Released: 10/10/2002 Document Revised: 01/16/2013 Document Reviewed:  09/18/2012 °ExitCare® Patient Information ©2015 [employer]. This information is not intended to replace advice given to you by your health care provider. Make sure you discuss any questions you have with your health care provider. ° °

## 2014-01-16 NOTE — Progress Notes (Signed)
Occupational Therapy Treatment Patient Details Name: [name] MRN: [id number] DOB: [birth year] Today's Date: 01/16/2014    History of present illness 53 year old female with a history of myasthenia gravis,  RA, chronic venous stasis ulcers, history of DVT hypertension admitted with Chronic LE wounds and leg swelling. Receives in home nursing care. Ran out of wound care supplies 1 week ago. Over that time there has been progressive pain, redness and swelling and discharge from open wounds. Foul odor. Getting worse. No dressing changes in 1 week.   OT comments  Pt participated in ADL retraining session today sitting up in bed/HOB elevated. She is non-ambulatory and overall Mod A +2 bed mobility at this time. She was able to complete UB bathe/dressing and grooming w/ set up assist. Will cont toward POC/goals acutely for OT to assist in maximizing independence, rec SNF.    Follow Up Recommendations  SNF;Supervision/Assistance - 24 hour    Equipment Recommendations  None recommended by OT;Other (comment) (Defer to next venue)    Recommendations for Other Services      Precautions / Restrictions Precautions Precautions: Fall Restrictions Weight Bearing Restrictions: No Other Position/Activity Restrictions: pressure area R heel, fused R knee       Mobility Bed Mobility Overal bed mobility: +2 for physical assistance Bed Mobility: Supine to Sit     Supine to sit: Mod assist     General bed mobility comments: mod A to pivot hips and scoot to HOB, bed flat, pt used rails then bed was tilted to allow for gravity assist (therapist assisted w/ pad).  Transfers                      Balance                                   ADL Overall ADL's : Needs assistance/impaired     Grooming: Wash/dry hands;Wash/dry face;Oral care;Set up;Sitting   Upper Body Bathing: Set up;Sitting (HOB elevated)   Lower Body Bathing: Moderate assistance;Sitting/lateral  leans;Bed level   Upper Body Dressing : Set up;Sitting (HOB elevated)   Lower Body Dressing:  (NT, pt awaiting dressing changes bilat LE's)               Functional mobility during ADLs:  (Non ambulatory) General ADL Comments: Pt participated in ADL retraining session today for grooming, UB & LB bathing/dressing sitting up in bed (HOB elevated). Pt is overall set up assist, she denied need to use 3:1 or bed pan this morning.      Vision  Wears glasses at all times, no changes.                   Perception     Praxis      Cognition   Behavior During Therapy: WFL for tasks assessed/performed Overall Cognitive Status: Within Functional Limits for tasks assessed                       Extremity/Trunk Assessment               Exercises     Shoulder Instructions       General Comments      Pertinent Vitals/ Pain       Pain Assessment: No/denies pain  Home Living  Prior Functioning/Environment              Frequency Min 2X/week     Progress Toward Goals  OT Goals(current goals can now be found in the care plan section)  Progress towards OT goals: Progressing toward goals     Plan Discharge plan remains appropriate    Co-evaluation                 End of Session     Activity Tolerance Patient tolerated treatment well   Patient Left in bed;with call bell/phone within reach   Nurse Communication Other (comment) (ADL's performed bed level and sitting up in bed.)        Time: 7408-1448 OT Time Calculation (min): 24 min  Charges: OT General Charges $OT Visit: 1 Procedure OT Treatments $Self Care/Home Management : 23-37 mins (24 min)  Adasyn Mcadams Beth Dixon, OT 01/16/2014, 9:19 AM

## 2014-01-16 NOTE — Progress Notes (Signed)
CSW followed up with pt at bedside regarding SNF bed offer.   Pt chooses bed at [facility].  CSW contacted facility to notify of pt acceptance of bed offer and confirmed bed availability for today.  CSW to facilitate pt discharge needs this afternoon.  Alison Murray, MSW, [place] Work [phone]

## 2014-01-16 NOTE — Care Management Note (Signed)
CARE MANAGEMENT NOTE 01/16/2014  Patient:  [name],[name]   Account Number:  [id number]  Date Initiated:  01/16/2014  Documentation initiated by:  Marney Doctor  Subjective/Objective Assessment:   53 yo admitted with wound infection     Action/Plan:   From home alone   Anticipated DC Date:  01/16/2014   Anticipated DC Plan:  SKILLED NURSING FACILITY  In-house referral  Clinical Social Worker      DC Planning Services  CM consult      Choice offered to / List presented to:             Status of service:  In process, will continue to follow Medicare Important Message given?  YES (If response is "NO", the following Medicare IM given date fields will be blank) Date Medicare IM given:  01/16/2014 Medicare IM given by:  Marney Doctor Date Additional Medicare IM given:   Additional Medicare IM given by:    Discharge Disposition:    Per UR Regulation:    If discussed at Long Length of Stay Meetings, dates discussed:    Comments:  Marney Doctor RN,BSN,NCM

## 2014-01-16 NOTE — Progress Notes (Signed)
CSW continuing to follow for disposition planning.  CSW followed up with pt at bedside to provide SNF bed offers.  CSW provided pt list and clarified pt questions. CSW notified pt that per MD, pt medically ready for discharge today. Pt shared that she wanted to research options on her cell phone. CSW expressed understanding and discussed that it will be important to have decision by lunch time in order for CSW to facilitate pt discharge this afternoon. Pt expressed understanding.  CSW to follow up with pt regarding decision for SNF and facilitate pt discharge needs this afternoon.  Alison Murray, MSW, [place] Work [phone]

## 2014-01-16 NOTE — Progress Notes (Signed)
Pt for discharge to [facility].  CSW facilitated pt discharge needs including contacting facility, faxing pt discharge information via [facility], discussing with pt at bedside, providing RN phone number to call report, and arranging ambulance transport via [place] for pt to [facility].  Pt coping appropriately with transition to [facility] for short term rehab.  No further social work needs identified at this time.  CSW signing off.   Alison Murray, MSW, [place] Work [phone]

## 2014-01-16 NOTE — Discharge Summary (Signed)
Physician Discharge Summary  Letticia Bhattacharyya XBM:[id number] DOB: 10-Dec-1960 DOA: 01/11/2014  PCP: MATTHEWS,MICHELLE A., MD  Admit date: 01/11/2014 Discharge date: 01/16/2014  Recommendations for Outpatient Follow-up:   Patient will take doxycycline for 10 days on discharge. She has received broad-spectrum antibiotics, vancomycin and Zosyn for 4 days during this admission. This will complete a total of 10 days treatment for skin infection.  Wound type: Chronic venous stasis ulcers. Patient states she is independent in care. States she sometimes changes the bandage 4 times day if they begin to sting.   Left ischial ulcer, present on admission. Stage III pressure ulcer. Denuded skin to sacrum, consistent with moisture associated skin damage. MASD Pressure Ulcer POA:yes Sacrum and left ischium  Right lateral LE: 10cm x 5cm x 0.5cm. Right posterior LE: 5cm x 4cm x 0.5cm. Left LE circumferential ulceration: 15cm x 26cm x 0.4cm with central necrotic area measuring 4cm x 3cm.  Right heel has a 1 cm x 1.5 cm pale pink open ulceration. Painful to touch and patient states she cannot bear weight on this leg due to this ulcer. Large, loosely adherent calloused skin was trimmed from the foot surrounding this ulcer.  Wound bed: Both wounds are 75% pale pink and very moist, 25% yellow finbrinous material. Wound bed: pink nongranulating tissue and yellow fibrinous devitalized tissue.  Drainage (amount, consistency, odor) Moderate, serosanguinous drainage. Musty odor. Bleeds easily with cleansing and dressing.  Periwound: Dry, intact skin.   Dressing procedure/placement/frequency: Cleanse bilateral legs with soap and water. Apply barrier cream to moisturize periwound skin. Place Xeroform gauze to open wounds to bilateral lower legs and right heel. Top with ABD pads (2 layers) and secure with kerlix and tape. Change daily.   Cleanse ulcer to left ischium with NS and pat gently dry.  Apply calcium alginate to wound bed to loosely fill dead space. Top with ABD pad. Change Mon-Wed-Fri. Allevyn silicone border foam to sacral denuded skin for protection and to promote healing. Change Mon-Wed-Fri   Discharge Diagnoses:  Principal Problem:   Wound infection Active Problems:   Hypokalemia   Myasthenia gravis   Venous stasis   AKI (acute kidney injury)   Cellulitis   Sepsis   Lactic acidosis   Essential hypertension    Discharge Condition: stable   Diet recommendation: as tolerated   History of present illness:  53 y.o. female with a PMH of chronic lower extremity wounds who was admitted 01/11/14 with lower extremity wound infection in the setting of having run out of her dressing supplies and not changing them for one week.  Assessment/Plan:   Principal Problem:  Sepsis secondary to bilateral lower extremity wound infection/cellulitis in the setting of venous stasis with associated lactic acidosis  Patient has received broad-spectrum antibiotics for 4 days, vancomycin and Zosyn. White blood cell count has improved since the admission, 12.4 prior to discharge.   We will switch to doxycycline for 10 days on discharge which would complete a total of 2 weeks treatment with antibiotics.   Wound care per WOC recommendations. recommendation for dressing changes addressed in follow-up section above.    blood cultures show no growth to date . No strep or staph isolated from surface wound cultures.   Patient has been seen by plastic surgery. Pt will have to follow up with Dr. Migdalia Dk outpatient (possible vac placement but this will be addressed outpatient).   Active Problems:  Microcytic anemia  Iron studies done 12/06/13. Ferritin 77. These findings are consistent with anemia of chronic  disease/inflammation.   Hypokalemia  Corrected with supplementation.   Myasthenia gravis  Continue home Mestinon.  S/P PT evaluation: SNF recommended.   AKI  (acute kidney injury)  Baseline creatinine 1.1. Admission creatinine 1.4. Started on IV fluids. Creatinine now back to usual baseline values.  Code Status: FULL DVT Prophylaxis: Heparin Family Communication: No family at the bedside.    IV Access:    Peripheral IV   Procedures and diagnostic studies:    None.  Medical Consultants:    None.  Anti-Infectives:    Vancomycin 01/11/14---> 01/16/2014  Zosyn 01/11/14---> 01/16/2014    Signed:  Leisa Lenz, MD  [facility] 01/16/2014, 10:44 AM  Pager #: [phone]   Discharge Exam: Filed Vitals:   01/15/14 2127  BP: 179/83  Pulse: 122  Temp: 97.9 F (36.6 C)  Resp: 18   Filed Vitals:   01/15/14 0601 01/15/14 1035 01/15/14 1405 01/15/14 2127  BP: 132/73 142/67 126/76 179/83  Pulse: 87 87 95 122  Temp: 98.7 F (37.1 C) 98.1 F (36.7 C) 98 F (36.7 C) 97.9 F (36.6 C)  TempSrc: Oral Oral Oral Oral  Resp: 18 16 18 18   Height:  5\' 6"  (1.676 m)    Weight:  83.008 kg (183 lb)    SpO2: 96% 98% 99% 98%    General: Pt is alert, follows commands appropriately, not in acute distress Cardiovascular: Regular rate and rhythm, S1/S2 +, no murmurs Respiratory: Clear to auscultation bilaterally, no wheezing, no crackles, no rhonchi Abdominal: Soft, non tender, non distended, bowel sounds +, no guarding Extremities: dressing in place, has venous stasis ulcers and ischial ulcer Neuro: Grossly nonfocal  Discharge Instructions  Discharge Instructions    Call MD for:  difficulty breathing, headache or visual disturbances    Complete by:  As directed      Call MD for:  persistant dizziness or light-headedness    Complete by:  As directed      Call MD for:  persistant nausea and vomiting    Complete by:  As directed      Call MD for:  severe uncontrolled pain    Complete by:  As directed      Diet - low sodium heart healthy    Complete by:  As directed      Discharge wound care:     Complete by:  As directed   Bilateral cellulitis to chronic venous stasis ulcers.  Wound type: Chronic venous stasis ulcers. Patient states she is independent in care. States she sometimes changes the bandage 4 times day if they begin to sting.  Left ischial ulcer, present on admission. Stage III pressure ulcer. Denuded skin to sacrum, consistent with moisture associated skin damage. MASD Pressure Ulcer POA:yes Sacrum and left ischium Measurement:Right lateral LE: 10cm x 5cm x 0.5cm. Right posterior LE: 5cm x 4cm x 0.5cm. Left LE circumferential ulceration: 15cm x 26cm x 0.4cm with central necrotic area measuring 4cm x 3cm.  Right heel has a 1 cm x 1.5 cm pale pink open ulceration. Painful to touch and patient states she cannot bear weight on this leg due to this ulcer. Large, loosely adherent calloused skin was trimmed from the foot surrounding this ulcer.  Wound bed: Both wounds are 75% pale pink and very moist, 25% yellow finbrinous material. Wound bed: pink nongranulating tissue and yellow fibrinous devitalized tissue.  Drainage (amount, consistency, odor) Moderate, serosanguinous drainage. Musty odor. Bleeds easily with cleansing and dressing.  Periwound: Dry, intact skin.  Dressing procedure/placement/frequency:  Cleanse bilateral legs with soap and water. Apply barrier cream to moisturize periwound skin. Place Xeroform gauze to open wounds to bilateral lower legs and right heel. Top with ABD pads (2 layers) and secure with kerlix and tape. Change daily.   Cleanse ulcer to left ischium with NS and pat gently dry. Apply calcium alginate to wound bed to loosely fill dead space. Top with ABD pad. Change Mon-Wed-Fri. Allevyn silicone border foam to sacral denuded skin for protection and to promote healing. Change Mon-Wed-Fri Conservative sharp wound debridement (CSWD performed at the bedside): Dead, loosely adherent callous trimmed from the right heel.     Increase  activity slowly    Complete by:  As directed             Medication List    TAKE these medications        albuterol 108 (90 BASE) MCG/ACT inhaler  Commonly known as:  PROVENTIL HFA;VENTOLIN HFA  Inhale 4 puffs into the lungs every 6 (six) hours as needed. For shortness of breath     alum & mag hydroxide-simeth 200-200-20 MG/5ML suspension  Commonly known as:  MAALOX/MYLANTA  Take 30 mLs by mouth every 4 (four) hours as needed for indigestion or heartburn.     COD LIVER OIL PO  Take 2 capsules by mouth daily.     diphenhydrAMINE 25 MG tablet  Commonly known as:  BENADRYL  Take 1 tablet (25 mg total) by mouth every 6 (six) hours as needed.     doxycycline 100 MG tablet  Commonly known as:  VIBRA-TABS  Take 1 tablet (100 mg total) by mouth 2 (two) times daily.     EPIPEN 0.3 mg/0.3 mL Devi  Generic drug:  EPINEPHrine  Inject 0.3 mg into the muscle as needed. For allergic reaction     ferrous sulfate 325 (65 FE) MG tablet  Take 325 mg by mouth daily with breakfast.     Fish Oil 500 MG Caps  Take 1,000 mg by mouth daily.     folic acid 941 MCG tablet  Commonly known as:  FOLVITE  Take 400 mcg by mouth daily.     Garlic Oil 7408 MG Caps  Take 1,000 mg by mouth daily.     hydrochlorothiazide 25 MG tablet  Commonly known as:  HYDRODIURIL  Take 1 tablet (25 mg total) by mouth daily.     hydrocortisone cream 1 %  Apply topically 2 (two) times daily.     hydrOXYzine 25 MG tablet  Commonly known as:  ATARAX/VISTARIL  Take 1 tablet (25 mg total) by mouth every 4 (four) hours as needed for itching.     lidocaine 4 % external solution  Commonly known as:  XYLOCAINE  Apply 5 mLs topically 4 (four) times daily.     nivea cream  Apply 1 application topically daily. Apply all over body     nystatin cream  Commonly known as:  MYCOSTATIN  Apply 1 application topically 2 (two) times daily.     ondansetron 4 MG tablet  Commonly known as:  ZOFRAN  Take 1 tablet (4 mg  total) by mouth every 6 (six) hours as needed for nausea.     oxyCODONE 5 MG immediate release tablet  Commonly known as:  Oxy IR/ROXICODONE  Take 1 tablet (5 mg total) by mouth every 4 (four) hours as needed for severe pain.     pyridostigmine 60 MG tablet  Commonly known as:  MESTINON  Take 1 tablet (60  mg total) by mouth 4 (four) times daily.     RA GUMMY VITAMINS & MINERALS PO  Take 1 tablet by mouth daily.           Follow-up Information    Follow up with MATTHEWS,MICHELLE A., MD. Schedule an appointment as soon as possible for a visit in 2 weeks.   Specialty:  Internal Medicine   Why:  Follow up appt after recent hospitalization   Contact information:   [place] [address] [phone]        The results of significant diagnostics from this hospitalization (including imaging, microbiology, ancillary and laboratory) are listed below for reference.    Significant Diagnostic Studies: Dg Lumbar Spine Complete  12/20/2013   CLINICAL DATA:  The patient fell earlier today  EXAM: LUMBAR SPINE - COMPLETE 4+ VIEW  COMPARISON:  None.  FINDINGS: Frontal, lateral, spot lumbosacral lateral, and bilateral oblique views were obtained. There are 5 non-rib-bearing lumbar type vertebral bodies. There is no fracture or spondylolisthesis. There is moderate disc space narrowing at L5-S1. There is slight disc space narrowing at L3-4 and L4-5. There is facet osteoarthritic change at L4-5 and L5-S1 bilaterally. No erosive change.  IMPRESSION: Areas of osteoarthritic change.  No fracture or spondylolisthesis.   Electronically Signed   By: Lowella Grip M.D.   On: 12/20/2013 16:22   Dg Pelvis 1-2 Views  12/20/2013   CLINICAL DATA:  53 year old female with history of trauma from a fall 10:30 this morning with injury to the buttocks, with small skin tear on the left buttocks.  EXAM: PELVIS - 1-2 VIEW  COMPARISON:  No priors.  FINDINGS: There is no evidence of pelvic  fracture or diastasis. No pelvic bone lesions are seen.  IMPRESSION: Negative.   Electronically Signed   By: Vinnie Langton M.D.   On: 12/20/2013 15:22   Dg Tibia/fibula Left  12/20/2013   CLINICAL DATA:  Pt fell at 10:30 this AM, landed on buttocks, per EMS small skin tear left buttocks  EXAM: LEFT TIBIA AND FIBULA - 2 VIEW  COMPARISON:  Radiograph 08/26/2010.  FINDINGS: There is periosteal thickening in the distal diaphysis of the tibia and fibula. These findings are similar to comparison exam. No evidence of acute fracture. There is no subcutaneous gas. There are a calcifications within the scan which are new.  IMPRESSION: 1. Periosteal thickening of the distal tibia and fibula. Cannot exclude chronic osteomyelitis. 2. New skin calcifications. 3. No evidence of fracture.   Electronically Signed   By: Suzy Bouchard M.D.   On: 12/20/2013 15:28   Dg Tibia/fibula Right  12/20/2013   CLINICAL DATA:  Pt fell at 10:30 this AM, landed on buttocks, per EMS small skin tear left buttocks. Leg wounds observed with maggots and adhered bandages to skin. EMS states majority of bandages removed, however some were attached to skin. Bilateral lower leg wounds, pain, redness, and swelling.Low back pain. Best obtainable images due to pt condition.  EXAM: RIGHT TIBIA AND FIBULA - 2 VIEW  COMPARISON:  08/26/2010  FINDINGS: There is degenerative fusion of the right knee. No evidence for acute fracture. No radiopaque foreign body or soft tissue gas. There is irregularity of the soft tissues of the calf.  IMPRESSION: 1. Degenerative fusion of the knee. 2. Irregularity of the soft tissues.   Electronically Signed   By: Shon Hale M.D.   On: 12/20/2013 15:28    Microbiology: Recent Results (from the past 240 hour(s))  Culture, blood (single)     Status: None (Preliminary result)   Collection Time: 01/11/14  9:05 PM  Result Value Ref Range Status   Specimen Description BLOOD RIGHT ANTECUBITAL  Final   Special Requests  BOTTLES DRAWN AEROBIC AND ANAEROBIC 3ML  Final   Culture  Setup Time   Final    01/12/2014 15:28 Performed at [facility]    Culture   Final           BLOOD CULTURE RECEIVED NO GROWTH TO DATE CULTURE WILL BE HELD FOR 5 DAYS BEFORE ISSUING A FINAL NEGATIVE REPORT Performed at [facility]    Report Status PENDING  Incomplete  Culture, blood (routine x 2)     Status: None (Preliminary result)   Collection Time: 01/11/14  9:08 PM  Result Value Ref Range Status   Specimen Description BLOOD LEFT ANTECUBITAL  Final   Special Requests BOTTLES DRAWN AEROBIC AND ANAEROBIC 5CC  Final   Culture  Setup Time   Final    01/12/2014 00:49 Performed at [facility]    Culture   Final           BLOOD CULTURE RECEIVED NO GROWTH TO DATE CULTURE WILL BE HELD FOR 5 DAYS BEFORE ISSUING A FINAL NEGATIVE REPORT Performed at [facility]    Report Status PENDING  Incomplete  Wound culture     Status: None   Collection Time: 01/11/14  9:54 PM  Result Value Ref Range Status   Specimen Description WOUND L LEG  Final   Special Requests Normal  Final   Gram Stain   Final    RARE WBC PRESENT, PREDOMINANTLY MONONUCLEAR NO SQUAMOUS EPITHELIAL CELLS SEEN ABUNDANT GRAM NEGATIVE RODS Performed at [facility]    Culture   Final    MULTIPLE ORGANISMS PRESENT, NONE PREDOMINANT Note: NO STAPHYLOCOCCUS AUREUS ISOLATED NO GROUP A STREP (S.PYOGENES) ISOLATED Performed at [facility]    Report Status 01/14/2014 FINAL  Final  Wound culture     Status: None   Collection Time: 01/11/14 10:19 PM  Result Value Ref Range Status   Specimen Description WOUND R LEG  Final   Special Requests NONE  Final   Gram Stain   Final    FEW WBC PRESENT,BOTH PMN AND MONONUCLEAR RARE SQUAMOUS EPITHELIAL CELLS PRESENT ABUNDANT GRAM NEGATIVE RODS Performed at [facility]    Culture   Final    MULTIPLE ORGANISMS PRESENT, NONE PREDOMINANT Note: NO STAPHYLOCOCCUS AUREUS  ISOLATED NO GROUP A STREP (S.PYOGENES) ISOLATED Performed at [facility]    Report Status 01/13/2014 FINAL  Final  MRSA PCR Screening     Status: None   Collection Time: 01/12/14  2:08 AM  Result Value Ref Range Status   MRSA by PCR NEGATIVE NEGATIVE Final    Comment:        The GeneXpert MRSA Assay (FDA approved for NASAL specimens only), is one component of a comprehensive MRSA colonization surveillance program. It is not intended to diagnose MRSA infection nor to guide or monitor treatment for MRSA infections.      Labs: Basic Metabolic Panel:  Recent Labs Lab 01/11/14 2103 01/11/14 2120 01/12/14 0608 01/13/14 0605 01/15/14 0848 01/16/14 0410  NA 137 136* 135* 136* 135 134*  K 3.3* 3.4* 2.5* 3.8 3.8 4.5  CL 95* 101 98 104 102 101  CO2 23  --  26 24 29 30   GLUCOSE 148* 148* 134* 98 98 118*  BUN  34* 43* 34* 30* 17 15  CREATININE 1.12* 1.40* 1.11* 0.96 0.80 0.91  CALCIUM 9.9  --  8.6 8.4 8.2* 8.0*  MG  --   --  1.9  --   --   --    Liver Function Tests:  Recent Labs Lab 01/12/14 0608  AST 29  ALT 22  ALKPHOS 103  BILITOT 0.4  PROT 5.4*  ALBUMIN 2.0*   No results for input(s): LIPASE, AMYLASE in the last 168 hours. No results for input(s): AMMONIA in the last 168 hours. CBC:  Recent Labs Lab 01/11/14 2103 01/11/14 2120 01/12/14 0608 01/13/14 0605  WBC 26.9*  --  19.9* 12.4*  HGB 11.8* 14.3 9.8* 9.8*  HCT 37.1 42.0 30.5* 31.0*  MCV 68.5*  --  67.9* 69.2*  PLT PLATELET CLUMPS NOTED ON SMEAR, COUNT APPEARS ADEQUATE  --  339 277   Cardiac Enzymes: No results for input(s): CKTOTAL, CKMB, CKMBINDEX, TROPONINI in the last 168 hours. BNP: BNP (last 3 results) No results for input(s): PROBNP in the last 8760 hours. CBG: No results for input(s): GLUCAP in the last 168 hours.  Time coordinating discharge: Over 30 minutes

## 2014-01-17 DIAGNOSIS — E669 Obesity, unspecified: Secondary | ICD-10-CM | POA: Diagnosis not present

## 2014-01-17 DIAGNOSIS — I872 Venous insufficiency (chronic) (peripheral): Secondary | ICD-10-CM | POA: Diagnosis not present

## 2014-01-17 DIAGNOSIS — L03119 Cellulitis of unspecified part of limb: Secondary | ICD-10-CM | POA: Diagnosis not present

## 2014-01-17 DIAGNOSIS — I10 Essential (primary) hypertension: Secondary | ICD-10-CM | POA: Diagnosis not present

## 2014-01-18 LAB — CULTURE, BLOOD (SINGLE): CULTURE: NO GROWTH

## 2014-01-18 LAB — CULTURE, BLOOD (ROUTINE X 2): Culture: NO GROWTH

## 2014-01-21 ENCOUNTER — Encounter: Payer: Self-pay | Admitting: Internal Medicine

## 2014-01-24 DIAGNOSIS — I83018 Varicose veins of right lower extremity with ulcer other part of lower leg: Secondary | ICD-10-CM | POA: Diagnosis not present

## 2014-01-24 DIAGNOSIS — I83028 Varicose veins of left lower extremity with ulcer other part of lower leg: Secondary | ICD-10-CM | POA: Diagnosis not present

## 2014-01-24 DIAGNOSIS — I87011 Postthrombotic syndrome with ulcer of right lower extremity: Secondary | ICD-10-CM | POA: Diagnosis not present

## 2014-01-24 DIAGNOSIS — I890 Lymphedema, not elsewhere classified: Secondary | ICD-10-CM | POA: Diagnosis not present

## 2014-01-24 DIAGNOSIS — I872 Venous insufficiency (chronic) (peripheral): Secondary | ICD-10-CM | POA: Diagnosis not present

## 2014-01-24 DIAGNOSIS — I87012 Postthrombotic syndrome with ulcer of left lower extremity: Secondary | ICD-10-CM | POA: Diagnosis not present

## 2014-01-24 DIAGNOSIS — L89324 Pressure ulcer of left buttock, stage 4: Secondary | ICD-10-CM | POA: Diagnosis not present

## 2014-01-31 DIAGNOSIS — L89304 Pressure ulcer of unspecified buttock, stage 4: Secondary | ICD-10-CM | POA: Diagnosis not present

## 2014-01-31 DIAGNOSIS — I872 Venous insufficiency (chronic) (peripheral): Secondary | ICD-10-CM | POA: Diagnosis not present

## 2014-01-31 DIAGNOSIS — I83018 Varicose veins of right lower extremity with ulcer other part of lower leg: Secondary | ICD-10-CM | POA: Diagnosis not present

## 2014-01-31 DIAGNOSIS — L89892 Pressure ulcer of other site, stage 2: Secondary | ICD-10-CM | POA: Diagnosis not present

## 2014-01-31 DIAGNOSIS — I83028 Varicose veins of left lower extremity with ulcer other part of lower leg: Secondary | ICD-10-CM | POA: Diagnosis not present

## 2014-01-31 DIAGNOSIS — I890 Lymphedema, not elsewhere classified: Secondary | ICD-10-CM | POA: Diagnosis not present

## 2014-02-04 ENCOUNTER — Other Ambulatory Visit: Payer: Self-pay | Admitting: Internal Medicine

## 2014-02-09 DIAGNOSIS — L89312 Pressure ulcer of right buttock, stage 2: Secondary | ICD-10-CM | POA: Diagnosis not present

## 2014-02-09 DIAGNOSIS — I872 Venous insufficiency (chronic) (peripheral): Secondary | ICD-10-CM | POA: Diagnosis not present

## 2014-02-09 DIAGNOSIS — L97223 Non-pressure chronic ulcer of left calf with necrosis of muscle: Secondary | ICD-10-CM | POA: Diagnosis not present

## 2014-02-09 DIAGNOSIS — L03115 Cellulitis of right lower limb: Secondary | ICD-10-CM | POA: Diagnosis not present

## 2014-02-09 DIAGNOSIS — L89322 Pressure ulcer of left buttock, stage 2: Secondary | ICD-10-CM | POA: Diagnosis not present

## 2014-02-09 DIAGNOSIS — L97213 Non-pressure chronic ulcer of right calf with necrosis of muscle: Secondary | ICD-10-CM | POA: Diagnosis not present

## 2014-02-11 ENCOUNTER — Ambulatory Visit (INDEPENDENT_AMBULATORY_CARE_PROVIDER_SITE_OTHER): Payer: Medicare Other | Admitting: Internal Medicine

## 2014-02-11 VITALS — BP 147/95 | HR 111 | Temp 98.4°F | Resp 18 | Ht 66.0 in | Wt 190.0 lb

## 2014-02-11 DIAGNOSIS — M1990 Unspecified osteoarthritis, unspecified site: Secondary | ICD-10-CM | POA: Diagnosis not present

## 2014-02-11 DIAGNOSIS — I10 Essential (primary) hypertension: Secondary | ICD-10-CM | POA: Diagnosis not present

## 2014-02-11 DIAGNOSIS — M79606 Pain in leg, unspecified: Secondary | ICD-10-CM | POA: Diagnosis not present

## 2014-02-11 DIAGNOSIS — D638 Anemia in other chronic diseases classified elsewhere: Secondary | ICD-10-CM | POA: Diagnosis not present

## 2014-02-11 DIAGNOSIS — L89304 Pressure ulcer of unspecified buttock, stage 4: Secondary | ICD-10-CM | POA: Diagnosis not present

## 2014-02-11 DIAGNOSIS — L97929 Non-pressure chronic ulcer of unspecified part of left lower leg with unspecified severity: Secondary | ICD-10-CM | POA: Diagnosis not present

## 2014-02-11 DIAGNOSIS — G7000 Myasthenia gravis without (acute) exacerbation: Secondary | ICD-10-CM | POA: Diagnosis not present

## 2014-02-11 DIAGNOSIS — L97919 Non-pressure chronic ulcer of unspecified part of right lower leg with unspecified severity: Secondary | ICD-10-CM | POA: Diagnosis not present

## 2014-02-11 DIAGNOSIS — L89309 Pressure ulcer of unspecified buttock, unspecified stage: Secondary | ICD-10-CM | POA: Insufficient documentation

## 2014-02-11 LAB — BASIC METABOLIC PANEL
BUN: 15 mg/dL (ref 6–23)
CALCIUM: 8.8 mg/dL (ref 8.4–10.5)
CO2: 26 mEq/L (ref 19–32)
CREATININE: 0.92 mg/dL (ref 0.50–1.10)
Chloride: 101 mEq/L (ref 96–112)
GLUCOSE: 76 mg/dL (ref 70–99)
POTASSIUM: 3.4 meq/L — AB (ref 3.5–5.3)
SODIUM: 137 meq/L (ref 135–145)

## 2014-02-11 MED ORDER — OXYCODONE HCL 5 MG PO TABS: 5.0000 mg | ORAL_TABLET | ORAL | Status: DC | PRN

## 2014-02-11 NOTE — Progress Notes (Signed)
Patient ID: [name], female   DOB: 1960/03/29, 54 y.o.   MRN: [id number]   [name], is a 54 y.o. female  HAL:[id number]  [id number]:[id number]  DOB - 10-Nov-1960  CC:  Chief Complaint  Patient presents with  . Follow-up    hospital follow up        HPI: [name] is a 54 y.o. female here today for post-hospital follow-up. She was recently in a SNF after hospitalization for infected wounds. During her stay in the SNF, she was dissatisfied with the management of her wounds so she arranged and orchestrated a visit to the [facility] of which she was a patient prior to the hospitalization. She has had one visit to the wound center since discharge and she is requesting home care services for wound management. Pt does have pain from the wounds and uses Oxycodone intermittently for pain management. She is currently out of pain medication.  Pt has also been doing well from the standpoint of the Myasthenia Gravis and has not had any recent weakness or joint pain.   She has a h/o acute kidney and it is not clear that she has a chronic component as almost all the lab values for kidney function have been obtained when she has been in a relatively sick state.   Pt does not take her BP readings at home and she denies any end organ symptoms.  Patient has No headache, No chest pain, No abdominal pain - No Nausea, No new weakness tingling or numbness, No Cough - SOB.  Allergies  Allergen Reactions  . Asa [Aspirin] Shortness Of Breath, Rash and Anaphylaxis  . Cat Hair Extract Anaphylaxis  . Dilaudid [Hydromorphone Hcl] Shortness Of Breath and Palpitations  . Ibuprofen Anaphylaxis  . Tylenol [Acetaminophen] Rash, Other (See Comments) and Anaphylaxis    Other reaction(s): SHORTNESS OF BREATH Salivation and hearing loss  . Ciprofloxacin     Other reaction(s): OTHER  . Dicloxacillin     Other reaction(s): OTHER  . Erythromycin Other (See Comments)    Burn veins  .  Hydromorphone     Other reaction(s): OTHER  . Indomethacin Other (See Comments)    Other reaction(s): OTHER Stroke symptoms  . Naproxen Other (See Comments)    Other reaction(s): OTHER Renal failure  . Nsaids Other (See Comments)    Kidney failure   . Tramadol     "stroke"  . Alginate-Collagen [Wound Dressings] Rash  . Gold-Containing Drug Products Rash  . Silvadene [Silver Sulfadiazine] Rash   Past Medical History  Diagnosis Date  . Asthma   . Hypertension   . CHF (congestive heart failure)   . Myasthenia gravis   . Heart murmur   . Venous stasis   . DVT (deep venous thrombosis) 2000, 2004    Not on anticoagulation   . Fibroids    Current Outpatient Prescriptions on File Prior to Visit  Medication Sig Dispense Refill  . albuterol (PROVENTIL HFA;VENTOLIN HFA) 108 (90 BASE) MCG/ACT inhaler Inhale 4 puffs into the lungs every 6 (six) hours as needed. For shortness of breath    . COD LIVER OIL PO Take 2 capsules by mouth daily.    . diphenhydrAMINE (BENADRYL) 25 MG tablet Take 1 tablet (25 mg total) by mouth every 6 (six) hours as needed. (Patient taking differently: Take 25 mg by mouth every 6 (six) hours as needed for itching or allergies. ) 30 tablet 0  . Emollient (NIVEA) cream Apply 1 application topically daily. Apply  all over body    . EPINEPHrine (EPIPEN) 0.3 mg/0.3 mL DEVI Inject 0.3 mg into the muscle as needed. For allergic reaction    . ferrous sulfate 325 (65 FE) MG tablet Take 325 mg by mouth daily with breakfast.     . folic acid (FOLVITE) 875 MCG tablet Take 400 mcg by mouth daily.    . Garlic Oil 6433 MG CAPS Take 1,000 mg by mouth daily.    . hydrochlorothiazide (HYDRODIURIL) 25 MG tablet Take 1 tablet (25 mg total) by mouth daily. 30 tablet 3  . hydrocortisone cream 1 % Apply topically 2 (two) times daily. 30 g 0  . hydrOXYzine (ATARAX/VISTARIL) 25 MG tablet Take 1 tablet (25 mg total) by mouth every 4 (four) hours as needed for itching. 30 tablet 0  .  lidocaine (XYLOCAINE) 4 % external solution Apply 5 mLs topically 4 (four) times daily. 50 mL 2  . Omega-3 Fatty Acids (FISH OIL) 500 MG CAPS Take 1,000 mg by mouth daily.    Marland Kitchen pyridostigmine (MESTINON) 60 MG tablet Take 1 tablet (60 mg total) by mouth 4 (four) times daily. 360 tablet 4   No current facility-administered medications on file prior to visit.   Family History  Problem Relation Age of Onset  . Depression    . Cancer Mother     breast  . CVA Father   . Atrial fibrillation Mother   . Stroke Mother    History   Social History  . Marital Status: Single    Spouse Name: N/A    Number of Children: N/A  . Years of Education: college   Occupational History  .      Disabled   Social History Main Topics  . Smoking status: Never Smoker   . Smokeless tobacco: Never Used  . Alcohol Use: No  . Drug Use: No  . Sexual Activity: No   Other Topics Concern  . Not on file   Social History Narrative   Patient lives at home alone.   Disabled.   Education college.   Right handed.   Caffeine patient drinks coffee.    Review of Systems: Constitutional: Negative for fever, chills, diaphoresis, activity change, appetite change and fatigue. HENT: Negative for ear pain, nosebleeds, congestion, facial swelling, rhinorrhea, neck pain, neck stiffness and ear discharge.  Eyes: Negative for pain, discharge, redness, itching and visual disturbance. Respiratory: Negative for cough, choking, chest tightness, shortness of breath, wheezing and stridor.  Cardiovascular: Negative for chest pain, palpitations and leg swelling. Gastrointestinal: Negative for abdominal distention. Genitourinary: Negative for dysuria, urgency, frequency, hematuria, flank pain, decreased urine volume, difficulty urinating and dyspareunia.  Musculoskeletal: Negative for back pain, joint swelling, arthralgia and gait problem. Neurological: Negative for dizziness, tremors, seizures, syncope, facial asymmetry, speech  difficulty, weakness, light-headedness, numbness and headaches.  Hematological: Negative for adenopathy. Does not bruise/bleed easily. Psychiatric/Behavioral: Negative for hallucinations, behavioral problems, confusion, dysphoric mood, decreased concentration and agitation.     Objective:   Filed Vitals:   02/11/14 1459  BP: 147/95  Pulse: 111  Temp: 98.4 F (36.9 C)  Resp: 18    Physical Exam: Constitutional: Patient appears well. No distress. HENT: Normocephalic, atraumatic, External right and left ear normal. Oropharynx is clear and moist.  Eyes: Conjunctivae and EOM are normal. PERRLA, no scleral icterus. Neck: Normal ROM. Neck supple. No JVD. No tracheal deviation. No thyromegaly. CVS: RRR, S1/S2 +, no murmurs, no gallops, no carotid bruit.  Pulmonary: Effort and breath sounds normal, no  stridor, rhonchi, wheezes, rales.  Abdominal: Soft. BS +, no distension, tenderness, rebound or guarding.  Musculoskeletal: Normal range of motion. No edema and no tenderness.  Lymphadenopathy: No lymphadenopathy noted, cervical, inguinal or axillary Neuro: Alert. Normal reflexes, muscle tone coordination. No cranial nerve deficit. Skin: Skin is warm and dry. No rash noted. Not diaphoretic. No erythema. No pallor. Psychiatric: Normal mood and affect. Behavior, judgment, thought content normal. Skin: Not evaluated as pt declined her dressings being removed.  Lab Results  Component Value Date   WBC 12.4* 01/13/2014   HGB 9.8* 01/13/2014   HCT 31.0* 01/13/2014   MCV 69.2* 01/13/2014   PLT 277 01/13/2014   Lab Results  Component Value Date   CREATININE 0.91 01/16/2014   BUN 15 01/16/2014   NA 134* 01/16/2014   K 4.5 01/16/2014   CL 101 01/16/2014   CO2 30 01/16/2014    Lab Results  Component Value Date   HGBA1C 6.0* 12/20/2013   Lipid Panel     Component Value Date/Time   CHOL  08/03/2008 1105    75        ATP III CLASSIFICATION:  <200     mg/dL   Desirable  200-239   mg/dL   Borderline High  >=240    mg/dL   High          TRIG 110 08/03/2008 1105   HDL 38* 08/03/2008 1105   CHOLHDL 2.0 08/03/2008 1105   VLDL 22 08/03/2008 1105   LDLCALC  08/03/2008 1105    15        Total Cholesterol/HDL:CHD Risk Coronary Heart Disease Risk Table                     Men   Women  1/2 Average Risk   3.4   3.3  Average Risk       5.0   4.4  2 X Average Risk   9.6   7.1  3 X Average Risk  23.4   11.0        Use the calculated Patient Ratio above and the CHD Risk Table to determine the patient's CHD Risk.        ATP III CLASSIFICATION (LDL):  <100     mg/dL   Optimal  100-129  mg/dL   Near or Above                    Optimal  130-159  mg/dL   Borderline  160-189  mg/dL   High  >190     mg/dL   Very High       Assessment and plan:   1.Essential hypertension - BP not at goal. Pt currently on a small dose of lisinopril and thiazide diuretic. CCB's contraindicated because of Myasthenia gravis. I will check her renal function. If okay will increase Lisinopril 10 10 mg daily. - Basic Metabolic Panel   2. Anemia of chronic disease - Pt has both anemia of chronic disease and Iron deficiency anemia. She is currently on Iron supplementation. Hb levels from 1 month ago stable at 9.8 g/dl.  Will recheck before next visit.  3.Arthritis associated with another disorder - Pt has arthritis changes to BUE's. 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 I made a referreal last year to [facility] and she was unable to keep the appointment. I am referring to Donna again. - Ambulatory referral to Rheumatology   4. Pain of  lower extremity, unspecified laterality - The pain is associated with her wounds. - oxyCODONE (OXY IR/ROXICODONE) 5 MG immediate release tablet; Take 1 tablet (5 mg total) by mouth every 4 (four) hours as needed for severe pain.  Dispense: 60 tablet; Refill: 0  5. Myasthenia gravis - Pt is on Mestinon and managed by  Neurology  6. Pressure ulcer of buttock, unspecified laterality, stage IV - Continue care at [facility].  7. Ulcers of both lower legs - [place] - oxyCODONE (OXY IR/ROXICODONE) 5 MG immediate release tablet; Take 1 tablet (5 mg total) by mouth every 4 (four) hours as needed for severe pain.  Dispense: 60 tablet; Refill: 0    Follow-up in 3 months. Check BP in 1 month.  The patient was given clear instructions to go to ER or return to medical center if symptoms don't improve, worsen or new problems develop. The patient verbalized understanding. The patient was told to call to get lab results if they haven't heard anything in the next week.     This note has been created with Surveyor, quantity. Any transcriptional errors are unintentional.    Bora Broner A., MD [place], [place]   02/11/2014, 4:03 PM

## 2014-02-12 DIAGNOSIS — G7000 Myasthenia gravis without (acute) exacerbation: Secondary | ICD-10-CM | POA: Diagnosis not present

## 2014-02-12 DIAGNOSIS — H40023 Open angle with borderline findings, high risk, bilateral: Secondary | ICD-10-CM | POA: Diagnosis not present

## 2014-02-15 MED ORDER — LISINOPRIL 10 MG PO TABS: 10.0000 mg | ORAL_TABLET | Freq: Every day | ORAL | Status: DC

## 2014-02-15 NOTE — Progress Notes (Signed)
Quick Note:  I am increasing Lisinopril to 10 mg daily. Not left in MyChart for Ms. Struckman. Please notify her of this. ______

## 2014-02-15 NOTE — Addendum Note (Signed)
Addended by: Liston Alba A on: 02/15/2014 11:27 AM   Modules accepted: Orders, Medications

## 2014-02-18 ENCOUNTER — Ambulatory Visit: Payer: Medicare Other | Admitting: Internal Medicine

## 2014-02-21 DIAGNOSIS — I83018 Varicose veins of right lower extremity with ulcer other part of lower leg: Secondary | ICD-10-CM | POA: Diagnosis not present

## 2014-02-21 DIAGNOSIS — I872 Venous insufficiency (chronic) (peripheral): Secondary | ICD-10-CM | POA: Diagnosis not present

## 2014-02-21 DIAGNOSIS — I83028 Varicose veins of left lower extremity with ulcer other part of lower leg: Secondary | ICD-10-CM | POA: Diagnosis not present

## 2014-02-21 DIAGNOSIS — L03116 Cellulitis of left lower limb: Secondary | ICD-10-CM | POA: Diagnosis not present

## 2014-02-21 DIAGNOSIS — R5381 Other malaise: Secondary | ICD-10-CM | POA: Diagnosis not present

## 2014-02-21 DIAGNOSIS — B369 Superficial mycosis, unspecified: Secondary | ICD-10-CM | POA: Diagnosis not present

## 2014-02-21 DIAGNOSIS — I890 Lymphedema, not elsewhere classified: Secondary | ICD-10-CM | POA: Diagnosis not present

## 2014-02-21 DIAGNOSIS — L89324 Pressure ulcer of left buttock, stage 4: Secondary | ICD-10-CM | POA: Diagnosis not present

## 2014-02-22 ENCOUNTER — Encounter: Payer: Medicare Other | Admitting: Internal Medicine

## 2014-02-22 DIAGNOSIS — L97223 Non-pressure chronic ulcer of left calf with necrosis of muscle: Secondary | ICD-10-CM | POA: Diagnosis not present

## 2014-02-22 DIAGNOSIS — L89312 Pressure ulcer of right buttock, stage 2: Secondary | ICD-10-CM | POA: Diagnosis not present

## 2014-02-22 DIAGNOSIS — L03115 Cellulitis of right lower limb: Secondary | ICD-10-CM | POA: Diagnosis not present

## 2014-02-22 DIAGNOSIS — L97213 Non-pressure chronic ulcer of right calf with necrosis of muscle: Secondary | ICD-10-CM | POA: Diagnosis not present

## 2014-02-22 DIAGNOSIS — L89322 Pressure ulcer of left buttock, stage 2: Secondary | ICD-10-CM | POA: Diagnosis not present

## 2014-02-22 DIAGNOSIS — I872 Venous insufficiency (chronic) (peripheral): Secondary | ICD-10-CM | POA: Diagnosis not present

## 2014-02-23 DIAGNOSIS — L89323 Pressure ulcer of left buttock, stage 3: Secondary | ICD-10-CM | POA: Diagnosis not present

## 2014-02-23 DIAGNOSIS — L97213 Non-pressure chronic ulcer of right calf with necrosis of muscle: Secondary | ICD-10-CM | POA: Diagnosis not present

## 2014-02-23 DIAGNOSIS — L97821 Non-pressure chronic ulcer of other part of left lower leg limited to breakdown of skin: Secondary | ICD-10-CM | POA: Diagnosis not present

## 2014-02-23 DIAGNOSIS — L89610 Pressure ulcer of right heel, unstageable: Secondary | ICD-10-CM | POA: Diagnosis not present

## 2014-02-23 DIAGNOSIS — Z4800 Encounter for change or removal of nonsurgical wound dressing: Secondary | ICD-10-CM | POA: Diagnosis not present

## 2014-02-23 DIAGNOSIS — L97223 Non-pressure chronic ulcer of left calf with necrosis of muscle: Secondary | ICD-10-CM | POA: Diagnosis not present

## 2014-02-23 DIAGNOSIS — L89212 Pressure ulcer of right hip, stage 2: Secondary | ICD-10-CM | POA: Diagnosis not present

## 2014-02-23 DIAGNOSIS — L03115 Cellulitis of right lower limb: Secondary | ICD-10-CM | POA: Diagnosis not present

## 2014-02-23 DIAGNOSIS — I872 Venous insufficiency (chronic) (peripheral): Secondary | ICD-10-CM | POA: Diagnosis not present

## 2014-02-23 DIAGNOSIS — L03116 Cellulitis of left lower limb: Secondary | ICD-10-CM | POA: Diagnosis not present

## 2014-02-23 DIAGNOSIS — L89322 Pressure ulcer of left buttock, stage 2: Secondary | ICD-10-CM | POA: Diagnosis not present

## 2014-02-23 DIAGNOSIS — D638 Anemia in other chronic diseases classified elsewhere: Secondary | ICD-10-CM | POA: Diagnosis not present

## 2014-02-23 DIAGNOSIS — L89312 Pressure ulcer of right buttock, stage 2: Secondary | ICD-10-CM | POA: Diagnosis not present

## 2014-02-23 DIAGNOSIS — G7000 Myasthenia gravis without (acute) exacerbation: Secondary | ICD-10-CM | POA: Diagnosis not present

## 2014-02-25 DIAGNOSIS — L89323 Pressure ulcer of left buttock, stage 3: Secondary | ICD-10-CM | POA: Diagnosis not present

## 2014-02-25 DIAGNOSIS — L97223 Non-pressure chronic ulcer of left calf with necrosis of muscle: Secondary | ICD-10-CM | POA: Diagnosis not present

## 2014-02-25 DIAGNOSIS — I872 Venous insufficiency (chronic) (peripheral): Secondary | ICD-10-CM | POA: Diagnosis not present

## 2014-02-25 DIAGNOSIS — L89322 Pressure ulcer of left buttock, stage 2: Secondary | ICD-10-CM | POA: Diagnosis not present

## 2014-02-25 DIAGNOSIS — L97821 Non-pressure chronic ulcer of other part of left lower leg limited to breakdown of skin: Secondary | ICD-10-CM | POA: Diagnosis not present

## 2014-02-25 DIAGNOSIS — L97213 Non-pressure chronic ulcer of right calf with necrosis of muscle: Secondary | ICD-10-CM | POA: Diagnosis not present

## 2014-02-26 ENCOUNTER — Telehealth: Payer: Self-pay | Admitting: Internal Medicine

## 2014-02-26 DIAGNOSIS — L89322 Pressure ulcer of left buttock, stage 2: Secondary | ICD-10-CM | POA: Diagnosis not present

## 2014-02-26 DIAGNOSIS — L97821 Non-pressure chronic ulcer of other part of left lower leg limited to breakdown of skin: Secondary | ICD-10-CM | POA: Diagnosis not present

## 2014-02-26 DIAGNOSIS — L89323 Pressure ulcer of left buttock, stage 3: Secondary | ICD-10-CM | POA: Diagnosis not present

## 2014-02-26 DIAGNOSIS — L97223 Non-pressure chronic ulcer of left calf with necrosis of muscle: Secondary | ICD-10-CM | POA: Diagnosis not present

## 2014-02-26 DIAGNOSIS — I872 Venous insufficiency (chronic) (peripheral): Secondary | ICD-10-CM | POA: Diagnosis not present

## 2014-02-26 DIAGNOSIS — L97213 Non-pressure chronic ulcer of right calf with necrosis of muscle: Secondary | ICD-10-CM | POA: Diagnosis not present

## 2014-02-26 DIAGNOSIS — L97919 Non-pressure chronic ulcer of unspecified part of right lower leg with unspecified severity: Secondary | ICD-10-CM

## 2014-02-26 DIAGNOSIS — M79606 Pain in leg, unspecified: Secondary | ICD-10-CM

## 2014-02-26 DIAGNOSIS — L97929 Non-pressure chronic ulcer of unspecified part of left lower leg with unspecified severity: Principal | ICD-10-CM

## 2014-02-26 MED ORDER — OXYCODONE HCL 5 MG PO TABS: 5.0000 mg | ORAL_TABLET | ORAL | Status: DC | PRN

## 2014-02-26 NOTE — Telephone Encounter (Signed)
[facility] reviewed adn noted taht [name] has received Opiate prescriptions from multiple providers. All are post hiospital except for the prescription issued by Seward Meth, AGNP. On 02/01/2014. I will write a prescription for Oxycodone 5 mg #60 tabs but will not write any further prescriptions until we have had a discussion and patietn has a signed opiate agreement as she is requiring Oxycodone on a chronic basis for the pain associated with her extensive wounds.

## 2014-02-26 NOTE — Telephone Encounter (Signed)
Refill request for Oxycodone 5mg . LOV 02/15/14. Please advise. Thanks!

## 2014-02-27 ENCOUNTER — Telehealth: Payer: Self-pay | Admitting: Internal Medicine

## 2014-02-27 DIAGNOSIS — Y92009 Unspecified place in unspecified non-institutional (private) residence as the place of occurrence of the external cause: Secondary | ICD-10-CM

## 2014-02-27 DIAGNOSIS — W19XXXA Unspecified fall, initial encounter: Secondary | ICD-10-CM | POA: Insufficient documentation

## 2014-02-27 DIAGNOSIS — L97919 Non-pressure chronic ulcer of unspecified part of right lower leg with unspecified severity: Secondary | ICD-10-CM

## 2014-02-27 DIAGNOSIS — E46 Unspecified protein-calorie malnutrition: Secondary | ICD-10-CM | POA: Insufficient documentation

## 2014-02-27 DIAGNOSIS — L97929 Non-pressure chronic ulcer of unspecified part of left lower leg with unspecified severity: Principal | ICD-10-CM

## 2014-02-27 NOTE — Telephone Encounter (Signed)
Received a call from nurse from [place] who has been attending [name] at home. She expresses a concern about [name]'s ability to obtain food and also for her safety in her house that is cluttered. She reports that her landlord has been providing transportation when she is unable to access the bus and also assisting with obtaining her groceries. Her landlord will no longer be able to assist and the concern is for nutrition in a setting of significant LE wounds that are having difficulty with healing. Additionally, she reports that OT has seen and evaluated patient and feels that her current living arrangement is unsafe. I will place an order for SW and also make a referral to [facility].

## 2014-02-28 DIAGNOSIS — L89323 Pressure ulcer of left buttock, stage 3: Secondary | ICD-10-CM | POA: Diagnosis not present

## 2014-02-28 DIAGNOSIS — L97223 Non-pressure chronic ulcer of left calf with necrosis of muscle: Secondary | ICD-10-CM | POA: Diagnosis not present

## 2014-02-28 DIAGNOSIS — L89322 Pressure ulcer of left buttock, stage 2: Secondary | ICD-10-CM | POA: Diagnosis not present

## 2014-02-28 DIAGNOSIS — L97821 Non-pressure chronic ulcer of other part of left lower leg limited to breakdown of skin: Secondary | ICD-10-CM | POA: Diagnosis not present

## 2014-02-28 DIAGNOSIS — I872 Venous insufficiency (chronic) (peripheral): Secondary | ICD-10-CM | POA: Diagnosis not present

## 2014-02-28 DIAGNOSIS — L97213 Non-pressure chronic ulcer of right calf with necrosis of muscle: Secondary | ICD-10-CM | POA: Diagnosis not present

## 2014-03-01 ENCOUNTER — Emergency Department (HOSPITAL_COMMUNITY)
Admission: EM | Admit: 2014-03-01 | Discharge: 2014-03-01 | Disposition: A | Discharge status: Home / Self Care | Payer: Medicare Other | Attending: Emergency Medicine | Admitting: Emergency Medicine

## 2014-03-01 ENCOUNTER — Emergency Department (HOSPITAL_COMMUNITY): Payer: Medicare Other

## 2014-03-01 ENCOUNTER — Encounter (HOSPITAL_COMMUNITY): Payer: Self-pay | Admitting: Emergency Medicine

## 2014-03-01 DIAGNOSIS — Z8669 Personal history of other diseases of the nervous system and sense organs: Secondary | ICD-10-CM | POA: Diagnosis not present

## 2014-03-01 DIAGNOSIS — Z86718 Personal history of other venous thrombosis and embolism: Secondary | ICD-10-CM | POA: Diagnosis not present

## 2014-03-01 DIAGNOSIS — R011 Cardiac murmur, unspecified: Secondary | ICD-10-CM | POA: Diagnosis not present

## 2014-03-01 DIAGNOSIS — S7002XA Contusion of left hip, initial encounter: Secondary | ICD-10-CM | POA: Diagnosis not present

## 2014-03-01 DIAGNOSIS — S79912A Unspecified injury of left hip, initial encounter: Secondary | ICD-10-CM | POA: Diagnosis not present

## 2014-03-01 DIAGNOSIS — I10 Essential (primary) hypertension: Secondary | ICD-10-CM | POA: Diagnosis not present

## 2014-03-01 DIAGNOSIS — M25552 Pain in left hip: Secondary | ICD-10-CM | POA: Diagnosis not present

## 2014-03-01 DIAGNOSIS — Z87448 Personal history of other diseases of urinary system: Secondary | ICD-10-CM | POA: Diagnosis not present

## 2014-03-01 DIAGNOSIS — J45909 Unspecified asthma, uncomplicated: Secondary | ICD-10-CM | POA: Diagnosis not present

## 2014-03-01 DIAGNOSIS — I509 Heart failure, unspecified: Secondary | ICD-10-CM | POA: Diagnosis not present

## 2014-03-01 DIAGNOSIS — Z79899 Other long term (current) drug therapy: Secondary | ICD-10-CM | POA: Diagnosis not present

## 2014-03-01 DIAGNOSIS — W1839XA Other fall on same level, initial encounter: Secondary | ICD-10-CM | POA: Diagnosis not present

## 2014-03-01 DIAGNOSIS — Y9289 Other specified places as the place of occurrence of the external cause: Secondary | ICD-10-CM | POA: Diagnosis not present

## 2014-03-01 DIAGNOSIS — Y998 Other external cause status: Secondary | ICD-10-CM | POA: Insufficient documentation

## 2014-03-01 DIAGNOSIS — W19XXXA Unspecified fall, initial encounter: Secondary | ICD-10-CM

## 2014-03-01 DIAGNOSIS — Y9389 Activity, other specified: Secondary | ICD-10-CM | POA: Diagnosis not present

## 2014-03-01 MED ORDER — OXYCODONE HCL 5 MG PO TABS
10.0000 mg | ORAL_TABLET | ORAL | Status: AC
  Administered 2014-03-01: 10 mg via ORAL
  Filled 2014-03-01: qty 2

## 2014-03-01 NOTE — ED Notes (Signed)
Patient transported to X-ray 

## 2014-03-01 NOTE — Discharge Instructions (Signed)
Contusion °A contusion is a deep bruise. Contusions happen when an injury causes bleeding under the skin. Signs of bruising include pain, puffiness (swelling), and discolored skin. The contusion may turn blue, purple, or yellow. °HOME CARE  °· Put ice on the injured area. °¨ Put ice in a plastic bag. °¨ Place a towel between your skin and the bag. °¨ Leave the ice on for 15-20 minutes, 03-04 times a day. °· Only take medicine as told by your doctor. °· Rest the injured area. °· If possible, raise (elevate) the injured area to lessen puffiness. °GET HELP RIGHT AWAY IF:  °· You have more bruising or puffiness. °· You have pain that is getting worse. °· Your puffiness or pain is not helped by medicine. °MAKE SURE YOU:  °· Understand these instructions. °· Will watch your condition. °· Will get help right away if you are not doing well or get worse. °Document Released: 06/30/2007 Document Revised: 04/05/2011 Document Reviewed: 11/16/2010 °ExitCare® Patient Information ©2015 [employer]. This information is not intended to replace advice given to you by your health care provider. Make sure you discuss any questions you have with your health care provider. ° °

## 2014-03-01 NOTE — ED Notes (Signed)
Bed: WA02 Expected date:  Expected time:  Means of arrival:  Comments: Short stay-syncope

## 2014-03-01 NOTE — ED Notes (Signed)
Pt ambulated with use of wheelchair as a walker, as per her norm.

## 2014-03-01 NOTE — ED Provider Notes (Signed)
CSN: [id number]     Arrival date & time 03/01/14  1152 History   First MD Initiated Contact with Patient 03/01/14 1241     Chief Complaint  Patient presents with  . Fall     (Consider location/radiation/quality/duration/timing/severity/associated sxs/prior Treatment) Patient is a 54 y.o. female presenting with fall. The history is provided by the patient.  Fall This is a new problem. The current episode started 1 to 2 hours ago. Episode frequency: once. The problem has been resolved. Pertinent negatives include no chest pain, no abdominal pain, no headaches and no shortness of breath. Nothing aggravates the symptoms. Nothing relieves the symptoms. She has tried nothing for the symptoms. The treatment provided no relief.    Past Medical History  Diagnosis Date  . Asthma   . Hypertension   . CHF (congestive heart failure)   . Myasthenia gravis   . Heart murmur   . Venous stasis   . DVT (deep venous thrombosis) 2000, 2004    Not on anticoagulation   . Fibroids    Past Surgical History  Procedure Laterality Date  . Tonsillectomy    . Myomectomy    . Failed bilateral leg grafts    . Adenoidectomy     Family History  Problem Relation Age of Onset  . Depression    . Cancer Mother     breast  . CVA Father   . Atrial fibrillation Mother   . Stroke Mother    History  Substance Use Topics  . Smoking status: Never Smoker   . Smokeless tobacco: Never Used  . Alcohol Use: No   OB History    No data available     Review of Systems  Constitutional: Negative for fever and fatigue.  HENT: Negative for congestion and drooling.   Eyes: Negative for pain.  Respiratory: Negative for cough and shortness of breath.   Cardiovascular: Negative for chest pain.  Gastrointestinal: Negative for nausea, vomiting, abdominal pain and diarrhea.  Genitourinary: Negative for dysuria and hematuria.  Musculoskeletal: Negative for back pain, gait problem and neck pain.  Skin: Negative for color  change.  Neurological: Negative for dizziness and headaches.  Hematological: Negative for adenopathy.  Psychiatric/Behavioral: Negative for behavioral problems.  All other systems reviewed and are negative.     Allergies  Asa; Cat hair extract; Dilaudid; Ibuprofen; Tylenol; Ciprofloxacin; Dicloxacillin; Erythromycin; Hydromorphone; Indomethacin; Naproxen; Nsaids; Tramadol; Alginate-collagen; Gold-containing drug products; and Silvadene  Home Medications   Prior to Admission medications   Medication Sig Start Date End Date Taking? Authorizing Provider  albuterol (PROVENTIL HFA;VENTOLIN HFA) 108 (90 BASE) MCG/ACT inhaler Inhale 4 puffs into the lungs every 6 (six) hours as needed. For shortness of breath    Historical Provider, MD  COD LIVER OIL PO Take 2 capsules by mouth daily.    Historical Provider, MD  diphenhydrAMINE (BENADRYL) 25 MG tablet Take 1 tablet (25 mg total) by mouth every 6 (six) hours as needed. Patient taking differently: Take 25 mg by mouth every 6 (six) hours as needed for itching or allergies.  09/09/13   Nishant Dhungel, MD  Emollient (NIVEA) cream Apply 1 application topically daily. Apply all over body    Historical Provider, MD  EPINEPHrine (EPIPEN) 0.3 mg/0.3 mL DEVI Inject 0.3 mg into the muscle as needed. For allergic reaction    Historical Provider, MD  ferrous sulfate 325 (65 FE) MG tablet Take 325 mg by mouth daily with breakfast.     Historical Provider, MD  folic acid (  FOLVITE) 400 MCG tablet Take 400 mcg by mouth daily.    Historical Provider, MD  Garlic Oil 6962 MG CAPS Take 1,000 mg by mouth daily.    Historical Provider, MD  hydrochlorothiazide (HYDRODIURIL) 25 MG tablet Take 1 tablet (25 mg total) by mouth daily. 12/24/13   Reyne Dumas, MD  hydrocortisone cream 1 % Apply topically 2 (two) times daily. 12/24/13   Reyne Dumas, MD  hydrOXYzine (ATARAX/VISTARIL) 25 MG tablet Take 1 tablet (25 mg total) by mouth every 4 (four) hours as needed for itching.  12/24/13   Reyne Dumas, MD  lidocaine (XYLOCAINE) 4 % external solution Apply 5 mLs topically 4 (four) times daily. 10/15/13   Leana Gamer, MD  lisinopril (PRINIVIL,ZESTRIL) 10 MG tablet Take 1 tablet (10 mg total) by mouth daily. 02/15/14   Leana Gamer, MD  Omega-3 Fatty Acids (FISH OIL) 500 MG CAPS Take 1,000 mg by mouth daily.    Historical Provider, MD  oxyCODONE (OXY IR/ROXICODONE) 5 MG immediate release tablet Take 1 tablet (5 mg total) by mouth every 4 (four) hours as needed for severe pain. 02/26/14   Leana Gamer, MD  pyridostigmine (MESTINON) 60 MG tablet Take 1 tablet (60 mg total) by mouth 4 (four) times daily. 10/05/13   Marcial Pacas, MD   BP 119/66 mmHg  Pulse 92  Temp(Src) 98.1 F (36.7 C) (Oral)  Resp 18  SpO2 100%  LMP 07/12/2011 Physical Exam  Constitutional: She is oriented to person, place, and time. She appears well-developed and well-nourished.  HENT:  Head: Normocephalic.  Mouth/Throat: Oropharynx is clear and moist. No oropharyngeal exudate.  Eyes: Conjunctivae and EOM are normal. Pupils are equal, round, and reactive to light.  Neck: Normal range of motion. Neck supple.  Cardiovascular: Normal rate, regular rhythm, normal heart sounds and intact distal pulses.  Exam reveals no gallop and no friction rub.   No murmur heard. Pulmonary/Chest: Effort normal and breath sounds normal. No respiratory distress. She has no wheezes.  Abdominal: Soft. Bowel sounds are normal. There is no tenderness. There is no rebound and no guarding.  Musculoskeletal: Normal range of motion. She exhibits tenderness (mild ttp of left lateral hip. ). She exhibits no edema.  Normal rom of hips bilaterally.   Neurological: She is alert and oriented to person, place, and time.  Skin: Skin is warm and dry.  Psychiatric: She has a normal mood and affect. Her behavior is normal.  Nursing note and vitals reviewed.   ED Course  Procedures (including critical care time) Labs  Review Labs Reviewed - No data to display  Imaging Review Dg Hip Unilat With Pelvis 2-3 Views Left  03/01/2014   CLINICAL DATA:  Fall this morning, left hip pain  EXAM: LEFT HIP (WITH PELVIS) 2-3 VIEWS  COMPARISON:  None.  FINDINGS: Three views of the left hip submitted. No acute fracture or subluxation. Minimal superior acetabulum spurring. Mild degenerative changes pubic symphysis. Do  IMPRESSION: No acute fracture or subluxation. Minimal superior acetabular spurring.   Electronically Signed   By: Lahoma Crocker M.D.   On: 03/01/2014 12:14     EKG Interpretation None      MDM   Final diagnoses:  Fall, initial encounter  Contusion of left hip, initial encounter    1:23 PM 54 y.o. female who presents with a mechanical fall which occurred prior to arrival while leaving her primary care doctor's office for a checkup. She states that she was pushing her wheelchair when it  got caught on a rug and she fell onto her left side. She denies hitting her head or loss of consciousness. She denies any headache. She complains of mild left hip pain. Imaging is noncontributory. Will get pain control and ambulate.  Pt is ambulatory. Will d/c home.   1:46 PM:  I have discussed the diagnosis/risks/treatment options with the patient and believe the pt to be eligible for discharge home to follow-up with her pcp as needed. We also discussed returning to the ED immediately if new or worsening sx occur. We discussed the sx which are most concerning (e.g., worsening pain) that necessitate immediate return. Medications administered to the patient during their visit and any new prescriptions provided to the patient are listed below.  Medications given during this visit Medications  oxyCODONE (Oxy IR/ROXICODONE) immediate release tablet 10 mg (10 mg Oral Given 03/01/14 1310)    New Prescriptions   No medications on file     Pamella Pert, MD 03/01/14 1519

## 2014-03-01 NOTE — ED Notes (Signed)
Pt was seen by [name] [facility] this morning to get a refill on pain medication. When leaving, pt sts she tripped over the mat on her way out and fell on her left side.   Staff at short stay called ER to report a syncopal episode, but according to pt, pt remembers the whole event and the fall. Denies LOC and hitting head. Pt sts "I just tripped."  Pt c/o L hip pain after fall. Pt A&Ox4.

## 2014-03-02 DIAGNOSIS — L97223 Non-pressure chronic ulcer of left calf with necrosis of muscle: Secondary | ICD-10-CM | POA: Diagnosis not present

## 2014-03-02 DIAGNOSIS — L89323 Pressure ulcer of left buttock, stage 3: Secondary | ICD-10-CM | POA: Diagnosis not present

## 2014-03-02 DIAGNOSIS — L89322 Pressure ulcer of left buttock, stage 2: Secondary | ICD-10-CM | POA: Diagnosis not present

## 2014-03-02 DIAGNOSIS — L97821 Non-pressure chronic ulcer of other part of left lower leg limited to breakdown of skin: Secondary | ICD-10-CM | POA: Diagnosis not present

## 2014-03-02 DIAGNOSIS — L97213 Non-pressure chronic ulcer of right calf with necrosis of muscle: Secondary | ICD-10-CM | POA: Diagnosis not present

## 2014-03-02 DIAGNOSIS — I872 Venous insufficiency (chronic) (peripheral): Secondary | ICD-10-CM | POA: Diagnosis not present

## 2014-03-04 ENCOUNTER — Ambulatory Visit: Payer: Medicare Other | Admitting: Internal Medicine

## 2014-03-05 DIAGNOSIS — L89322 Pressure ulcer of left buttock, stage 2: Secondary | ICD-10-CM | POA: Diagnosis not present

## 2014-03-05 DIAGNOSIS — L97213 Non-pressure chronic ulcer of right calf with necrosis of muscle: Secondary | ICD-10-CM | POA: Diagnosis not present

## 2014-03-05 DIAGNOSIS — L89323 Pressure ulcer of left buttock, stage 3: Secondary | ICD-10-CM | POA: Diagnosis not present

## 2014-03-05 DIAGNOSIS — L97821 Non-pressure chronic ulcer of other part of left lower leg limited to breakdown of skin: Secondary | ICD-10-CM | POA: Diagnosis not present

## 2014-03-05 DIAGNOSIS — I872 Venous insufficiency (chronic) (peripheral): Secondary | ICD-10-CM | POA: Diagnosis not present

## 2014-03-05 DIAGNOSIS — L97223 Non-pressure chronic ulcer of left calf with necrosis of muscle: Secondary | ICD-10-CM | POA: Diagnosis not present

## 2014-03-06 DIAGNOSIS — I872 Venous insufficiency (chronic) (peripheral): Secondary | ICD-10-CM | POA: Diagnosis not present

## 2014-03-06 DIAGNOSIS — L97223 Non-pressure chronic ulcer of left calf with necrosis of muscle: Secondary | ICD-10-CM | POA: Diagnosis not present

## 2014-03-06 DIAGNOSIS — L97213 Non-pressure chronic ulcer of right calf with necrosis of muscle: Secondary | ICD-10-CM | POA: Diagnosis not present

## 2014-03-06 DIAGNOSIS — L97821 Non-pressure chronic ulcer of other part of left lower leg limited to breakdown of skin: Secondary | ICD-10-CM | POA: Diagnosis not present

## 2014-03-06 DIAGNOSIS — L89322 Pressure ulcer of left buttock, stage 2: Secondary | ICD-10-CM | POA: Diagnosis not present

## 2014-03-06 DIAGNOSIS — L89323 Pressure ulcer of left buttock, stage 3: Secondary | ICD-10-CM | POA: Diagnosis not present

## 2014-03-08 DIAGNOSIS — L89322 Pressure ulcer of left buttock, stage 2: Secondary | ICD-10-CM | POA: Diagnosis not present

## 2014-03-08 DIAGNOSIS — L97223 Non-pressure chronic ulcer of left calf with necrosis of muscle: Secondary | ICD-10-CM | POA: Diagnosis not present

## 2014-03-08 DIAGNOSIS — I872 Venous insufficiency (chronic) (peripheral): Secondary | ICD-10-CM | POA: Diagnosis not present

## 2014-03-08 DIAGNOSIS — L89323 Pressure ulcer of left buttock, stage 3: Secondary | ICD-10-CM | POA: Diagnosis not present

## 2014-03-08 DIAGNOSIS — L97213 Non-pressure chronic ulcer of right calf with necrosis of muscle: Secondary | ICD-10-CM | POA: Diagnosis not present

## 2014-03-08 DIAGNOSIS — L97821 Non-pressure chronic ulcer of other part of left lower leg limited to breakdown of skin: Secondary | ICD-10-CM | POA: Diagnosis not present

## 2014-03-11 ENCOUNTER — Ambulatory Visit (INDEPENDENT_AMBULATORY_CARE_PROVIDER_SITE_OTHER): Payer: Medicare Other | Admitting: Internal Medicine

## 2014-03-11 ENCOUNTER — Encounter: Payer: Self-pay | Admitting: Internal Medicine

## 2014-03-11 ENCOUNTER — Telehealth: Payer: Self-pay

## 2014-03-11 VITALS — BP 123/65 | HR 107 | Temp 97.8°F | Resp 16 | Ht 66.0 in | Wt 176.0 lb

## 2014-03-11 DIAGNOSIS — E46 Unspecified protein-calorie malnutrition: Secondary | ICD-10-CM | POA: Diagnosis not present

## 2014-03-11 DIAGNOSIS — L97929 Non-pressure chronic ulcer of unspecified part of left lower leg with unspecified severity: Secondary | ICD-10-CM | POA: Diagnosis not present

## 2014-03-11 DIAGNOSIS — I10 Essential (primary) hypertension: Secondary | ICD-10-CM | POA: Diagnosis not present

## 2014-03-11 DIAGNOSIS — M79606 Pain in leg, unspecified: Secondary | ICD-10-CM | POA: Diagnosis not present

## 2014-03-11 DIAGNOSIS — L97919 Non-pressure chronic ulcer of unspecified part of right lower leg with unspecified severity: Secondary | ICD-10-CM | POA: Diagnosis not present

## 2014-03-11 LAB — BASIC METABOLIC PANEL
BUN: 19 mg/dL (ref 6–23)
CALCIUM: 8.7 mg/dL (ref 8.4–10.5)
CHLORIDE: 101 meq/L (ref 96–112)
CO2: 26 meq/L (ref 19–32)
CREATININE: 0.86 mg/dL (ref 0.50–1.10)
Glucose, Bld: 103 mg/dL — ABNORMAL HIGH (ref 70–99)
Potassium: 3.4 mEq/L — ABNORMAL LOW (ref 3.5–5.3)
SODIUM: 137 meq/L (ref 135–145)

## 2014-03-11 MED ORDER — OXYCODONE HCL 5 MG PO TABS: 5.0000 mg | ORAL_TABLET | ORAL | Status: DC | PRN

## 2014-03-11 NOTE — Telephone Encounter (Signed)
Called patients pharmacy ([facility] on [address]) they only dispensed 31 tablets of Oxycodone 5mg  on 03/02/2014 because that is all they had per Otila Kluver. Thanks!

## 2014-03-11 NOTE — Progress Notes (Signed)
Patient ID: [name], female   DOB: 17-Jun-1960, 54 y.o.   MRN: [id number]   [name], is a 54 y.o. female  MAU:[id number]  BWL:[id number]  DOB - 07-25-1960  CC:  Chief Complaint  Patient presents with  . Follow-up  . Hypertension  . Venous Stasis Ulcer       HPI: [name] is a 54 y.o. female here today to follow up on BP. She had a dose increase of Lisinopril to 10 mg on her last visit. She denies any dizziness currently but states that she initially noted some dizziness in he 1st day of taking the medication. However, per patient it has not re-occurred. She had a fall in the parking lot when she came to pick up her medications on the last visit. However she denied any dizziness or pre-syncope associated with the fall.   She continues to have wound care at the [facility] weekly on Thursdays, and care from [facility] 2 x week.   Pt has an appointment with [facility] in the near future. She recently had her vision examination with Dr. Lynnae Prude, and has a n appointment with Dr. Olevia Perches for Colonoscopy and with Dr. Ileana Ladd within the next month.   Patient has No headache, No chest pain, No abdominal pain - No Nausea, No new weakness tingling or numbness, No Cough - SOB.  Allergies  Allergen Reactions  . Asa [Aspirin] Shortness Of Breath, Rash and Anaphylaxis  . Cat Hair Extract Anaphylaxis  . Dilaudid [Hydromorphone Hcl] Shortness Of Breath and Palpitations  . Ibuprofen Anaphylaxis  . Tylenol [Acetaminophen] Rash, Other (See Comments) and Anaphylaxis    Other reaction(s): SHORTNESS OF BREATH Salivation and hearing loss  . Ciprofloxacin     Other reaction(s): OTHER  . Dicloxacillin     Other reaction(s): OTHER  . Erythromycin Other (See Comments)    Burn veins  . Hydromorphone     Other reaction(s): OTHER  . Indomethacin Other (See Comments)    Other reaction(s): OTHER Stroke symptoms  . Naproxen Other  (See Comments)    Other reaction(s): OTHER Renal failure  . Nsaids Other (See Comments)    Kidney failure   . Tramadol     "stroke"  . Alginate-Collagen [Wound Dressings] Rash  . Gold-Containing Drug Products Rash  . Silvadene [Silver Sulfadiazine] Rash   Past Medical History  Diagnosis Date  . Asthma   . Hypertension   . CHF (congestive heart failure)   . Myasthenia gravis   . Heart murmur   . Venous stasis   . DVT (deep venous thrombosis) 2000, 2004    Not on anticoagulation   . Fibroids    Current Outpatient Prescriptions on File Prior to Visit  Medication Sig Dispense Refill  . albuterol (PROVENTIL HFA;VENTOLIN HFA) 108 (90 BASE) MCG/ACT inhaler Inhale 4 puffs into the lungs every 6 (six) hours as needed. For shortness of breath    . COD LIVER OIL PO Take 2 capsules by mouth daily.    . diphenhydrAMINE (BENADRYL) 25 MG tablet Take 1 tablet (25 mg total) by mouth every 6 (six) hours as needed. (Patient taking differently: Take 25 mg by mouth every 6 (six) hours as needed for itching or allergies. ) 30 tablet 0  . Emollient (NIVEA) cream Apply 1 application topically daily. Apply all over body    . EPINEPHrine (EPIPEN) 0.3 mg/0.3 mL DEVI Inject 0.3 mg into the muscle as needed. For allergic reaction    . ferrous  sulfate 325 (65 FE) MG tablet Take 325 mg by mouth daily with breakfast.     . folic acid (FOLVITE) 409 MCG tablet Take 400 mcg by mouth daily.    . Garlic Oil 7353 MG CAPS Take 1,000 mg by mouth daily.    . hydrochlorothiazide (HYDRODIURIL) 25 MG tablet Take 1 tablet (25 mg total) by mouth daily. 30 tablet 3  . hydrocortisone cream 1 % Apply topically 2 (two) times daily. 30 g 0  . hydrOXYzine (ATARAX/VISTARIL) 25 MG tablet Take 1 tablet (25 mg total) by mouth every 4 (four) hours as needed for itching. 30 tablet 0  . lidocaine (XYLOCAINE) 4 % external solution Apply 5 mLs topically 4 (four) times daily. 50 mL 2  . lisinopril (PRINIVIL,ZESTRIL) 10 MG tablet Take 1  tablet (10 mg total) by mouth daily. 90 tablet 3  . Omega-3 Fatty Acids (FISH OIL) 500 MG CAPS Take 1,000 mg by mouth daily.    Marland Kitchen pyridostigmine (MESTINON) 60 MG tablet Take 1 tablet (60 mg total) by mouth 4 (four) times daily. 360 tablet 4   No current facility-administered medications on file prior to visit.   Family History  Problem Relation Age of Onset  . Depression    . Cancer Mother     breast  . CVA Father   . Atrial fibrillation Mother   . Stroke Mother    History   Social History  . Marital Status: Single    Spouse Name: N/A  . Number of Children: N/A  . Years of Education: college   Occupational History  .      Disabled   Social History Main Topics  . Smoking status: Never Smoker   . Smokeless tobacco: Never Used  . Alcohol Use: No  . Drug Use: No  . Sexual Activity: No   Other Topics Concern  . Not on file   Social History Narrative   Patient lives at home alone.   Disabled.   Education college.   Right handed.   Caffeine patient drinks coffee.    Review of Systems: Constitutional: Negative for fever, chills, diaphoresis, activity change, appetite change and fatigue. HENT: Negative for ear pain, nosebleeds, congestion, facial swelling, rhinorrhea, neck pain, neck stiffness and ear discharge.  Eyes: Negative for pain, discharge, redness, itching and visual disturbance. Respiratory: Negative for cough, choking, chest tightness, shortness of breath, wheezing and stridor.  Cardiovascular: Negative for chest pain, palpitations and leg swelling. Gastrointestinal: Negative for abdominal distention. Genitourinary: Negative for dysuria, urgency, frequency, hematuria, flank pain, decreased urine volume, difficulty urinating and dyspareunia.  Musculoskeletal: Negative for back pain, joint swelling, arthralgia and gait problem. Neurological: Negative for dizziness, tremors, seizures, syncope, facial asymmetry, speech difficulty, weakness, light-headedness,  numbness and headaches.  Hematological: Negative for adenopathy. Does not bruise/bleed easily. Psychiatric/Behavioral: Negative for hallucinations, behavioral problems, confusion, dysphoric mood, decreased concentration and agitation.     Objective:   Filed Vitals:   03/11/14 1303  BP: 123/65  Pulse: 107  Temp: 97.8 F (36.6 C)  Resp: 16    Physical Exam: Constitutional: Patient appears chronically ill and in no distress. Eyes: Conjunctivae and EOM are normal. PERRLA, no scleral icterus. Neck: Normal ROM. Neck supple. No JVD. No tracheal deviation. No thyromegaly. CVS: RRR, S1/S2 +, no murmurs, no gallops, no carotid bruit.  Pulmonary: Effort and breath sounds normal, no stridor, rhonchi, wheezes, rales.  Abdominal: Soft. BS +, no distension, tenderness, rebound or guarding.  Musculoskeletal: Normal range of motion. No edema  and no tenderness.  Lymphadenopathy: No lymphadenopathy noted, cervical, inguinal or axillary Neuro: Alert. Normal reflexes, muscle tone coordination. No cranial nerve deficit. Skin: Skin is warm and dry. No rash noted. Not diaphoretic. No erythema. No pallor. Psychiatric: Normal mood and affect. Behavior, judgment, thought content normal. Genitalia: External genitalia normal. Vaginal vault shows healthy tissue on visual inspection. No foul smelling discharge. Pt exhibits good lubrication with physiological discharge. No adnexal tenderness noted however there is a soft tissue mass felt on the anterior left vaginal vault.   Lab Results  Component Value Date   WBC 12.4* 01/13/2014   HGB 9.8* 01/13/2014   HCT 31.0* 01/13/2014   MCV 69.2* 01/13/2014   PLT 277 01/13/2014   Lab Results  Component Value Date   CREATININE 0.92 02/11/2014   BUN 15 02/11/2014   NA 137 02/11/2014   K 3.4* 02/11/2014   CL 101 02/11/2014   CO2 26 02/11/2014    Lab Results  Component Value Date   HGBA1C 6.0* 12/20/2013   Lipid Panel     Component Value Date/Time   CHOL   08/03/2008 1105    75        ATP III CLASSIFICATION:  <200     mg/dL   Desirable  200-239  mg/dL   Borderline High  >=240    mg/dL   High          TRIG 110 08/03/2008 1105   HDL 38* 08/03/2008 1105   CHOLHDL 2.0 08/03/2008 1105   VLDL 22 08/03/2008 1105   LDLCALC  08/03/2008 1105    15        Total Cholesterol/HDL:CHD Risk Coronary Heart Disease Risk Table                     Men   Women  1/2 Average Risk   3.4   3.3  Average Risk       5.0   4.4  2 X Average Risk   9.6   7.1  3 X Average Risk  23.4   11.0        Use the calculated Patient Ratio above and the CHD Risk Table to determine the patient's CHD Risk.        ATP III CLASSIFICATION (LDL):  <100     mg/dL   Optimal  100-129  mg/dL   Near or Above                    Optimal  130-159  mg/dL   Borderline  160-189  mg/dL   High  >190     mg/dL   Very High       Assessment and plan:   1. Essential hypertension - BP well controlled today although not at goal. I am concerned about orthostatic hypotension as she is a risk for fall. Will check orthostatic vital signs. Will also check renal function to see if any renal compromise with the lower BP. - Basic Metabolic Panel - Orthostatic vital signs  2. Protein-calorie malnutrition - Pt has clear Protein Calorie Malnutrition and has difficulty with poorly healing wounds. I will refer for nutrition education. - Referral to Nutrition and Diabetes Services  3. Ulcers of both lower legs - Pt is under care of [facility]. She is seen by them in the center 1 x week and is followed by [facility] 2 x week. She reports that she has been referred to Dermatology by the Wound  Center and has an upcoming appointment with them. - oxyCODONE (OXY IR/ROXICODONE) 5 MG immediate release tablet; Take 1 tablet (5 mg total) by mouth every 4 (four) hours as needed for severe pain.  Dispense: 60 tablet; Refill: 0  4. Pain of lower extremity, unspecified laterality - Pt has pain  associated with her chronic leg wounds. She had a recent prescription for Oxycodone #60 tabs but only 31 tabs were dispensed. I have verified this withhe Pharmacy and am writing a prescription as noted below. - oxyCODONE (OXY IR/ROXICODONE) 5 MG immediate release tablet; Take 1 tablet (5 mg total) by mouth every 4 (four) hours as needed for severe pain.  Dispense: 60 tablet; Refill: 0   Return in about 3 months (around 06/09/2014) for HTN, Anemia, protein calorie malnutrition.  The patient was given clear instructions to go to ER or return to medical center if symptoms don't improve, worsen or new problems develop. The patient verbalized understanding. The patient was told to call to get lab results if they haven't heard anything in the next week.     This note has been created with Surveyor, quantity. Any transcriptional errors are unintentional.    MATTHEWS,MICHELLE A., MD [place], [place]   03/11/2014, 1:44 PM

## 2014-03-12 DIAGNOSIS — L89323 Pressure ulcer of left buttock, stage 3: Secondary | ICD-10-CM | POA: Diagnosis not present

## 2014-03-12 DIAGNOSIS — I872 Venous insufficiency (chronic) (peripheral): Secondary | ICD-10-CM | POA: Diagnosis not present

## 2014-03-12 DIAGNOSIS — L97213 Non-pressure chronic ulcer of right calf with necrosis of muscle: Secondary | ICD-10-CM | POA: Diagnosis not present

## 2014-03-12 DIAGNOSIS — L89322 Pressure ulcer of left buttock, stage 2: Secondary | ICD-10-CM | POA: Diagnosis not present

## 2014-03-12 DIAGNOSIS — L97223 Non-pressure chronic ulcer of left calf with necrosis of muscle: Secondary | ICD-10-CM | POA: Diagnosis not present

## 2014-03-12 DIAGNOSIS — L97821 Non-pressure chronic ulcer of other part of left lower leg limited to breakdown of skin: Secondary | ICD-10-CM | POA: Diagnosis not present

## 2014-03-14 DIAGNOSIS — L89324 Pressure ulcer of left buttock, stage 4: Secondary | ICD-10-CM | POA: Diagnosis not present

## 2014-03-14 DIAGNOSIS — I872 Venous insufficiency (chronic) (peripheral): Secondary | ICD-10-CM | POA: Diagnosis not present

## 2014-03-14 DIAGNOSIS — I87012 Postthrombotic syndrome with ulcer of left lower extremity: Secondary | ICD-10-CM | POA: Diagnosis not present

## 2014-03-14 DIAGNOSIS — I890 Lymphedema, not elsewhere classified: Secondary | ICD-10-CM | POA: Diagnosis not present

## 2014-03-14 DIAGNOSIS — I87011 Postthrombotic syndrome with ulcer of right lower extremity: Secondary | ICD-10-CM | POA: Diagnosis not present

## 2014-03-14 DIAGNOSIS — R5381 Other malaise: Secondary | ICD-10-CM | POA: Diagnosis not present

## 2014-03-14 DIAGNOSIS — T148 Other injury of unspecified body region: Secondary | ICD-10-CM | POA: Diagnosis not present

## 2014-03-14 DIAGNOSIS — B369 Superficial mycosis, unspecified: Secondary | ICD-10-CM | POA: Diagnosis not present

## 2014-03-18 DIAGNOSIS — R5383 Other fatigue: Secondary | ICD-10-CM | POA: Diagnosis not present

## 2014-03-18 DIAGNOSIS — M2550 Pain in unspecified joint: Secondary | ICD-10-CM | POA: Diagnosis not present

## 2014-03-18 DIAGNOSIS — M069 Rheumatoid arthritis, unspecified: Secondary | ICD-10-CM | POA: Diagnosis not present

## 2014-03-18 DIAGNOSIS — S71009A Unspecified open wound, unspecified hip, initial encounter: Secondary | ICD-10-CM | POA: Diagnosis not present

## 2014-03-19 DIAGNOSIS — L97821 Non-pressure chronic ulcer of other part of left lower leg limited to breakdown of skin: Secondary | ICD-10-CM | POA: Diagnosis not present

## 2014-03-19 DIAGNOSIS — L89323 Pressure ulcer of left buttock, stage 3: Secondary | ICD-10-CM | POA: Diagnosis not present

## 2014-03-19 DIAGNOSIS — I872 Venous insufficiency (chronic) (peripheral): Secondary | ICD-10-CM | POA: Diagnosis not present

## 2014-03-19 DIAGNOSIS — L97213 Non-pressure chronic ulcer of right calf with necrosis of muscle: Secondary | ICD-10-CM | POA: Diagnosis not present

## 2014-03-19 DIAGNOSIS — L97223 Non-pressure chronic ulcer of left calf with necrosis of muscle: Secondary | ICD-10-CM | POA: Diagnosis not present

## 2014-03-19 DIAGNOSIS — L89322 Pressure ulcer of left buttock, stage 2: Secondary | ICD-10-CM | POA: Diagnosis not present

## 2014-03-21 ENCOUNTER — Encounter: Payer: Self-pay | Admitting: *Deleted

## 2014-03-21 DIAGNOSIS — L97821 Non-pressure chronic ulcer of other part of left lower leg limited to breakdown of skin: Secondary | ICD-10-CM | POA: Diagnosis not present

## 2014-03-21 DIAGNOSIS — I872 Venous insufficiency (chronic) (peripheral): Secondary | ICD-10-CM | POA: Diagnosis not present

## 2014-03-21 DIAGNOSIS — L97223 Non-pressure chronic ulcer of left calf with necrosis of muscle: Secondary | ICD-10-CM | POA: Diagnosis not present

## 2014-03-21 DIAGNOSIS — L89323 Pressure ulcer of left buttock, stage 3: Secondary | ICD-10-CM | POA: Diagnosis not present

## 2014-03-21 DIAGNOSIS — L89322 Pressure ulcer of left buttock, stage 2: Secondary | ICD-10-CM | POA: Diagnosis not present

## 2014-03-21 DIAGNOSIS — L97213 Non-pressure chronic ulcer of right calf with necrosis of muscle: Secondary | ICD-10-CM | POA: Diagnosis not present

## 2014-03-24 DIAGNOSIS — L97213 Non-pressure chronic ulcer of right calf with necrosis of muscle: Secondary | ICD-10-CM | POA: Diagnosis not present

## 2014-03-24 DIAGNOSIS — L97821 Non-pressure chronic ulcer of other part of left lower leg limited to breakdown of skin: Secondary | ICD-10-CM | POA: Diagnosis not present

## 2014-03-24 DIAGNOSIS — I872 Venous insufficiency (chronic) (peripheral): Secondary | ICD-10-CM | POA: Diagnosis not present

## 2014-03-24 DIAGNOSIS — L97223 Non-pressure chronic ulcer of left calf with necrosis of muscle: Secondary | ICD-10-CM | POA: Diagnosis not present

## 2014-03-24 DIAGNOSIS — L89323 Pressure ulcer of left buttock, stage 3: Secondary | ICD-10-CM | POA: Diagnosis not present

## 2014-03-24 DIAGNOSIS — L89322 Pressure ulcer of left buttock, stage 2: Secondary | ICD-10-CM | POA: Diagnosis not present

## 2014-03-25 ENCOUNTER — Other Ambulatory Visit: Payer: Self-pay | Admitting: Internal Medicine

## 2014-03-25 ENCOUNTER — Telehealth: Payer: Self-pay

## 2014-03-25 DIAGNOSIS — L97919 Non-pressure chronic ulcer of unspecified part of right lower leg with unspecified severity: Secondary | ICD-10-CM

## 2014-03-25 DIAGNOSIS — L97929 Non-pressure chronic ulcer of unspecified part of left lower leg with unspecified severity: Principal | ICD-10-CM

## 2014-03-25 DIAGNOSIS — M79606 Pain in leg, unspecified: Secondary | ICD-10-CM

## 2014-03-25 MED ORDER — OXYCODONE HCL 5 MG PO TABS: 5.0000 mg | ORAL_TABLET | ORAL | Status: DC | PRN

## 2014-03-25 NOTE — Telephone Encounter (Signed)
Refill request for oxycodone 5mg . LOV  03/11/2014. Please advise. Thanks!

## 2014-03-25 NOTE — Telephone Encounter (Signed)
Prescription written for Oxycodone 5 mg #120 tabs.  NCCSRS reviewed and no inconsistencies noted.

## 2014-03-26 DIAGNOSIS — L97213 Non-pressure chronic ulcer of right calf with necrosis of muscle: Secondary | ICD-10-CM | POA: Diagnosis not present

## 2014-03-26 DIAGNOSIS — L97223 Non-pressure chronic ulcer of left calf with necrosis of muscle: Secondary | ICD-10-CM | POA: Diagnosis not present

## 2014-03-26 DIAGNOSIS — L97821 Non-pressure chronic ulcer of other part of left lower leg limited to breakdown of skin: Secondary | ICD-10-CM | POA: Diagnosis not present

## 2014-03-26 DIAGNOSIS — I872 Venous insufficiency (chronic) (peripheral): Secondary | ICD-10-CM | POA: Diagnosis not present

## 2014-03-26 DIAGNOSIS — L89323 Pressure ulcer of left buttock, stage 3: Secondary | ICD-10-CM | POA: Diagnosis not present

## 2014-03-26 DIAGNOSIS — L89322 Pressure ulcer of left buttock, stage 2: Secondary | ICD-10-CM | POA: Diagnosis not present

## 2014-03-27 ENCOUNTER — Encounter: Payer: Self-pay | Admitting: Gastroenterology

## 2014-03-28 DIAGNOSIS — I87012 Postthrombotic syndrome with ulcer of left lower extremity: Secondary | ICD-10-CM | POA: Diagnosis not present

## 2014-03-28 DIAGNOSIS — I83018 Varicose veins of right lower extremity with ulcer other part of lower leg: Secondary | ICD-10-CM | POA: Diagnosis not present

## 2014-03-28 DIAGNOSIS — I890 Lymphedema, not elsewhere classified: Secondary | ICD-10-CM | POA: Diagnosis not present

## 2014-03-28 DIAGNOSIS — I87011 Postthrombotic syndrome with ulcer of right lower extremity: Secondary | ICD-10-CM | POA: Diagnosis not present

## 2014-03-28 DIAGNOSIS — I83028 Varicose veins of left lower extremity with ulcer other part of lower leg: Secondary | ICD-10-CM | POA: Diagnosis not present

## 2014-03-28 DIAGNOSIS — L03116 Cellulitis of left lower limb: Secondary | ICD-10-CM | POA: Diagnosis not present

## 2014-03-28 DIAGNOSIS — I872 Venous insufficiency (chronic) (peripheral): Secondary | ICD-10-CM | POA: Diagnosis not present

## 2014-03-30 DIAGNOSIS — L97223 Non-pressure chronic ulcer of left calf with necrosis of muscle: Secondary | ICD-10-CM | POA: Diagnosis not present

## 2014-03-30 DIAGNOSIS — L97821 Non-pressure chronic ulcer of other part of left lower leg limited to breakdown of skin: Secondary | ICD-10-CM | POA: Diagnosis not present

## 2014-03-30 DIAGNOSIS — L97213 Non-pressure chronic ulcer of right calf with necrosis of muscle: Secondary | ICD-10-CM | POA: Diagnosis not present

## 2014-03-30 DIAGNOSIS — I872 Venous insufficiency (chronic) (peripheral): Secondary | ICD-10-CM | POA: Diagnosis not present

## 2014-03-30 DIAGNOSIS — L89322 Pressure ulcer of left buttock, stage 2: Secondary | ICD-10-CM | POA: Diagnosis not present

## 2014-03-30 DIAGNOSIS — L89323 Pressure ulcer of left buttock, stage 3: Secondary | ICD-10-CM | POA: Diagnosis not present

## 2014-04-02 DIAGNOSIS — I872 Venous insufficiency (chronic) (peripheral): Secondary | ICD-10-CM | POA: Diagnosis not present

## 2014-04-02 DIAGNOSIS — L97223 Non-pressure chronic ulcer of left calf with necrosis of muscle: Secondary | ICD-10-CM | POA: Diagnosis not present

## 2014-04-02 DIAGNOSIS — L97213 Non-pressure chronic ulcer of right calf with necrosis of muscle: Secondary | ICD-10-CM | POA: Diagnosis not present

## 2014-04-02 DIAGNOSIS — L89323 Pressure ulcer of left buttock, stage 3: Secondary | ICD-10-CM | POA: Diagnosis not present

## 2014-04-02 DIAGNOSIS — L89322 Pressure ulcer of left buttock, stage 2: Secondary | ICD-10-CM | POA: Diagnosis not present

## 2014-04-02 DIAGNOSIS — L97821 Non-pressure chronic ulcer of other part of left lower leg limited to breakdown of skin: Secondary | ICD-10-CM | POA: Diagnosis not present

## 2014-04-04 DIAGNOSIS — L89323 Pressure ulcer of left buttock, stage 3: Secondary | ICD-10-CM | POA: Diagnosis not present

## 2014-04-04 DIAGNOSIS — L97821 Non-pressure chronic ulcer of other part of left lower leg limited to breakdown of skin: Secondary | ICD-10-CM | POA: Diagnosis not present

## 2014-04-04 DIAGNOSIS — I872 Venous insufficiency (chronic) (peripheral): Secondary | ICD-10-CM | POA: Diagnosis not present

## 2014-04-04 DIAGNOSIS — L89322 Pressure ulcer of left buttock, stage 2: Secondary | ICD-10-CM | POA: Diagnosis not present

## 2014-04-04 DIAGNOSIS — L97213 Non-pressure chronic ulcer of right calf with necrosis of muscle: Secondary | ICD-10-CM | POA: Diagnosis not present

## 2014-04-04 DIAGNOSIS — L97223 Non-pressure chronic ulcer of left calf with necrosis of muscle: Secondary | ICD-10-CM | POA: Diagnosis not present

## 2014-04-06 DIAGNOSIS — L89323 Pressure ulcer of left buttock, stage 3: Secondary | ICD-10-CM | POA: Diagnosis not present

## 2014-04-06 DIAGNOSIS — L97213 Non-pressure chronic ulcer of right calf with necrosis of muscle: Secondary | ICD-10-CM | POA: Diagnosis not present

## 2014-04-06 DIAGNOSIS — I872 Venous insufficiency (chronic) (peripheral): Secondary | ICD-10-CM | POA: Diagnosis not present

## 2014-04-06 DIAGNOSIS — L97821 Non-pressure chronic ulcer of other part of left lower leg limited to breakdown of skin: Secondary | ICD-10-CM | POA: Diagnosis not present

## 2014-04-06 DIAGNOSIS — L97223 Non-pressure chronic ulcer of left calf with necrosis of muscle: Secondary | ICD-10-CM | POA: Diagnosis not present

## 2014-04-06 DIAGNOSIS — L89322 Pressure ulcer of left buttock, stage 2: Secondary | ICD-10-CM | POA: Diagnosis not present

## 2014-04-10 DIAGNOSIS — B369 Superficial mycosis, unspecified: Secondary | ICD-10-CM | POA: Diagnosis not present

## 2014-04-10 DIAGNOSIS — L89892 Pressure ulcer of other site, stage 2: Secondary | ICD-10-CM | POA: Diagnosis not present

## 2014-04-10 DIAGNOSIS — I83018 Varicose veins of right lower extremity with ulcer other part of lower leg: Secondary | ICD-10-CM | POA: Diagnosis not present

## 2014-04-10 DIAGNOSIS — T148 Other injury of unspecified body region: Secondary | ICD-10-CM | POA: Diagnosis not present

## 2014-04-10 DIAGNOSIS — I83028 Varicose veins of left lower extremity with ulcer other part of lower leg: Secondary | ICD-10-CM | POA: Diagnosis not present

## 2014-04-10 DIAGNOSIS — I87012 Postthrombotic syndrome with ulcer of left lower extremity: Secondary | ICD-10-CM | POA: Diagnosis not present

## 2014-04-10 DIAGNOSIS — R5381 Other malaise: Secondary | ICD-10-CM | POA: Diagnosis not present

## 2014-04-10 DIAGNOSIS — I890 Lymphedema, not elsewhere classified: Secondary | ICD-10-CM | POA: Diagnosis not present

## 2014-04-10 DIAGNOSIS — L89324 Pressure ulcer of left buttock, stage 4: Secondary | ICD-10-CM | POA: Diagnosis not present

## 2014-04-10 DIAGNOSIS — I872 Venous insufficiency (chronic) (peripheral): Secondary | ICD-10-CM | POA: Diagnosis not present

## 2014-04-10 DIAGNOSIS — I87011 Postthrombotic syndrome with ulcer of right lower extremity: Secondary | ICD-10-CM | POA: Diagnosis not present

## 2014-04-11 DIAGNOSIS — L659 Nonscarring hair loss, unspecified: Secondary | ICD-10-CM | POA: Diagnosis not present

## 2014-04-11 DIAGNOSIS — L97911 Non-pressure chronic ulcer of unspecified part of right lower leg limited to breakdown of skin: Secondary | ICD-10-CM | POA: Diagnosis not present

## 2014-04-11 DIAGNOSIS — L97921 Non-pressure chronic ulcer of unspecified part of left lower leg limited to breakdown of skin: Secondary | ICD-10-CM | POA: Diagnosis not present

## 2014-04-12 ENCOUNTER — Telehealth: Payer: Self-pay | Admitting: Internal Medicine

## 2014-04-12 ENCOUNTER — Ambulatory Visit: Payer: Medicare Other | Admitting: Internal Medicine

## 2014-04-12 DIAGNOSIS — L97821 Non-pressure chronic ulcer of other part of left lower leg limited to breakdown of skin: Secondary | ICD-10-CM | POA: Diagnosis not present

## 2014-04-12 DIAGNOSIS — L97213 Non-pressure chronic ulcer of right calf with necrosis of muscle: Secondary | ICD-10-CM | POA: Diagnosis not present

## 2014-04-12 DIAGNOSIS — L89323 Pressure ulcer of left buttock, stage 3: Secondary | ICD-10-CM | POA: Diagnosis not present

## 2014-04-12 DIAGNOSIS — L89322 Pressure ulcer of left buttock, stage 2: Secondary | ICD-10-CM | POA: Diagnosis not present

## 2014-04-12 DIAGNOSIS — L97223 Non-pressure chronic ulcer of left calf with necrosis of muscle: Secondary | ICD-10-CM | POA: Diagnosis not present

## 2014-04-12 DIAGNOSIS — I872 Venous insufficiency (chronic) (peripheral): Secondary | ICD-10-CM | POA: Diagnosis not present

## 2014-04-12 NOTE — Telephone Encounter (Signed)
Says she has arthritis and although she has been icing her feet all morning her feet are really swollen. Unable to stand up or walk. Unable to keep her appointment. Will have to call back to reschedule.

## 2014-04-18 DIAGNOSIS — L97821 Non-pressure chronic ulcer of other part of left lower leg limited to breakdown of skin: Secondary | ICD-10-CM | POA: Diagnosis not present

## 2014-04-18 DIAGNOSIS — L89322 Pressure ulcer of left buttock, stage 2: Secondary | ICD-10-CM | POA: Diagnosis not present

## 2014-04-18 DIAGNOSIS — L97223 Non-pressure chronic ulcer of left calf with necrosis of muscle: Secondary | ICD-10-CM | POA: Diagnosis not present

## 2014-04-18 DIAGNOSIS — I872 Venous insufficiency (chronic) (peripheral): Secondary | ICD-10-CM | POA: Diagnosis not present

## 2014-04-18 DIAGNOSIS — L89323 Pressure ulcer of left buttock, stage 3: Secondary | ICD-10-CM | POA: Diagnosis not present

## 2014-04-18 DIAGNOSIS — L97213 Non-pressure chronic ulcer of right calf with necrosis of muscle: Secondary | ICD-10-CM | POA: Diagnosis not present

## 2014-04-20 DIAGNOSIS — L89323 Pressure ulcer of left buttock, stage 3: Secondary | ICD-10-CM | POA: Diagnosis not present

## 2014-04-20 DIAGNOSIS — L97821 Non-pressure chronic ulcer of other part of left lower leg limited to breakdown of skin: Secondary | ICD-10-CM | POA: Diagnosis not present

## 2014-04-20 DIAGNOSIS — L97223 Non-pressure chronic ulcer of left calf with necrosis of muscle: Secondary | ICD-10-CM | POA: Diagnosis not present

## 2014-04-20 DIAGNOSIS — L97213 Non-pressure chronic ulcer of right calf with necrosis of muscle: Secondary | ICD-10-CM | POA: Diagnosis not present

## 2014-04-20 DIAGNOSIS — L89322 Pressure ulcer of left buttock, stage 2: Secondary | ICD-10-CM | POA: Diagnosis not present

## 2014-04-20 DIAGNOSIS — I872 Venous insufficiency (chronic) (peripheral): Secondary | ICD-10-CM | POA: Diagnosis not present

## 2014-04-23 DIAGNOSIS — L97213 Non-pressure chronic ulcer of right calf with necrosis of muscle: Secondary | ICD-10-CM | POA: Diagnosis not present

## 2014-04-23 DIAGNOSIS — L97821 Non-pressure chronic ulcer of other part of left lower leg limited to breakdown of skin: Secondary | ICD-10-CM | POA: Diagnosis not present

## 2014-04-23 DIAGNOSIS — L89323 Pressure ulcer of left buttock, stage 3: Secondary | ICD-10-CM | POA: Diagnosis not present

## 2014-04-23 DIAGNOSIS — I872 Venous insufficiency (chronic) (peripheral): Secondary | ICD-10-CM | POA: Diagnosis not present

## 2014-04-23 DIAGNOSIS — L89322 Pressure ulcer of left buttock, stage 2: Secondary | ICD-10-CM | POA: Diagnosis not present

## 2014-04-23 DIAGNOSIS — L97223 Non-pressure chronic ulcer of left calf with necrosis of muscle: Secondary | ICD-10-CM | POA: Diagnosis not present

## 2014-04-24 ENCOUNTER — Other Ambulatory Visit: Payer: Self-pay | Admitting: *Deleted

## 2014-04-24 ENCOUNTER — Encounter: Payer: Self-pay | Admitting: *Deleted

## 2014-04-24 ENCOUNTER — Encounter: Payer: Self-pay | Admitting: Internal Medicine

## 2014-04-24 ENCOUNTER — Telehealth: Payer: Self-pay | Admitting: Internal Medicine

## 2014-04-24 DIAGNOSIS — L97929 Non-pressure chronic ulcer of unspecified part of left lower leg with unspecified severity: Principal | ICD-10-CM

## 2014-04-24 DIAGNOSIS — L97919 Non-pressure chronic ulcer of unspecified part of right lower leg with unspecified severity: Secondary | ICD-10-CM

## 2014-04-24 DIAGNOSIS — M79606 Pain in leg, unspecified: Secondary | ICD-10-CM

## 2014-04-24 MED ORDER — OXYCODONE HCL 5 MG PO TABS: 5.0000 mg | ORAL_TABLET | ORAL | Status: DC | PRN

## 2014-04-24 NOTE — Telephone Encounter (Signed)
Prescription written for Oxycodone 5 mg # 120 tabs. NCCSRS reviewed and no inconsistencies noted.

## 2014-04-24 NOTE — Patient Outreach (Signed)
[facility]) Care Management  0/94/0768  Lajoy Manolis [birth year] [id number]   Call placed to [place], J. Saporito, it inform her that this member has concerns about having to move from current residence today and not having a place to move.  Mrs. Saporito has a call scheduled with member tomorrow, but stated she will call member today to address concerns.  [name], BSN, [place] Management  [facility] Care Manager [phone]

## 2014-04-24 NOTE — Patient Outreach (Signed)
Received notification from [facility] administrative assistant stating that this member called to cancel [facility] community appointment for tomorrow due to the member having to move from current residence.  This care manager called member to inform her that there was no community nurse home visit scheduled for tomorrow, however there was a Psychologist, sport and exercise telephone call scheduled.  Member verbalizes understanding, then reports that she still does not have anywhere to move even though she has to be out of her current residence by tomorrow.  She asks this care manager if there were any recommendations, and this care manager referred her back to the social worker.  Member informed that this care manager will notify her social worker, J. Saporito, to contact her regarding possible recommendations on housing.  Member has no health concerns at this time.  [name], BSN, [place] Management  [facility] Care Manager [phone]

## 2014-04-24 NOTE — Patient Outreach (Signed)
[facility] [facility]) Care Management  [facility] Social Work  7/90/2409  Delight Crisafulli [birth year] [id number]    Current Medications:  Current Outpatient Prescriptions  Medication Sig Dispense Refill  . albuterol (PROVENTIL HFA;VENTOLIN HFA) 108 (90 BASE) MCG/ACT inhaler Inhale 4 puffs into the lungs every 6 (six) hours as needed. For shortness of breath    . COD LIVER OIL PO Take 2 capsules by mouth daily.    . diphenhydrAMINE (BENADRYL) 25 MG tablet Take 1 tablet (25 mg total) by mouth every 6 (six) hours as needed. (Patient taking differently: Take 25 mg by mouth every 6 (six) hours as needed for itching or allergies. ) 30 tablet 0  . Emollient (NIVEA) cream Apply 1 application topically daily. Apply all over body    . EPINEPHrine (EPIPEN) 0.3 mg/0.3 mL DEVI Inject 0.3 mg into the muscle as needed. For allergic reaction    . ferrous sulfate 325 (65 FE) MG tablet Take 325 mg by mouth daily with breakfast.     . folic acid (FOLVITE) 962 MCG tablet Take 400 mcg by mouth daily.    . Garlic Oil 2297 MG CAPS Take 1,000 mg by mouth daily.    . hydrochlorothiazide (HYDRODIURIL) 25 MG tablet Take 1 tablet (25 mg total) by mouth daily. 30 tablet 3  . hydrocortisone cream 1 % Apply topically 2 (two) times daily. 30 g 0  . hydrOXYzine (ATARAX/VISTARIL) 25 MG tablet Take 1 tablet (25 mg total) by mouth every 4 (four) hours as needed for itching. 30 tablet 0  . lidocaine (XYLOCAINE) 4 % external solution Apply 5 mLs topically 4 (four) times daily. 50 mL 2  . lisinopril (PRINIVIL,ZESTRIL) 10 MG tablet Take 1 tablet (10 mg total) by mouth daily. 90 tablet 3  . Omega-3 Fatty Acids (FISH OIL) 500 MG CAPS Take 1,000 mg by mouth daily.    Marland Kitchen oxyCODONE (OXY IR/ROXICODONE) 5 MG immediate release tablet Take 1 tablet (5 mg total) by mouth every 4 (four) hours as needed for severe pain. 120 tablet 0  . pyridostigmine (MESTINON) 60 MG tablet Take 1 tablet (60 mg total) by mouth 4 (four) times daily. 360  tablet 4   No current facility-administered medications for this visit.    Functional Status:  In your present state of health, do you have any difficulty performing the following activities: 01/12/2014 12/20/2013  Is the patient deaf or have difficulty hearing? N N  Hearing Y Y  Vision N N  Difficulty concentrating or making decisions Y Y  Walking or climbing stairs? Y Y  Doing errands, shopping? Tempie Donning    Fall/Depression Screening:  PHQ 2/9 Scores 03/11/2014 02/11/2014 12/06/2013 08/30/2013  PHQ - 2 Score 0 0 0 0    Assessment:  CSW received a call from [name], [facility] with [place] Management, this morning, indicating that patient was trying to get in contact with CSW to discuss possible placement arrangements.  CSW has made five attempts to try and contact patient by phone, as well as via text message, per patients request, since March 9th, without success. CSW mailed an outreach letter to patients home on March 17th, encouraging patient to contact CSW directly, within the next 10 business days, if patient was interested in receiving social work services through [employer], CSW did not receive a return call.  CSW was planning to perform a case closure on patient on March 31st, due to inability to establish initial contact with patient.  CSW made a sixth attempt to try and contact patient today, as well as submit a text message, but again, CSW did not receive a return call, nor did patient answer at the time of CSW's call.  CSW was able to confirm with Mrs. Orene Desanctis, as well as patients EMR (Electronic Medical Record) that CSW has the correct contact information for patient.  CSW is aware that patient is scheduled to be evicted from her current place of residence on March 31st; however, CSW has been unable to make contact with patient to offer assistance.  Plan: CSW will proceed with case closure on patient on March 31st if CSW does not receive a  return call from patient within the next 24 hours.  [name], BSW, MSW, [place] Management [place], [address] [place], [place] [address] [email or web address] [phone]

## 2014-04-24 NOTE — Telephone Encounter (Signed)
Refill request for oxycodone 5mg . LOV 03/11/14

## 2014-04-25 ENCOUNTER — Other Ambulatory Visit: Payer: Self-pay | Admitting: *Deleted

## 2014-04-25 ENCOUNTER — Encounter: Payer: Self-pay | Admitting: Internal Medicine

## 2014-04-25 NOTE — Patient Outreach (Signed)
[facility]) Care [name]  [facility] Social Work  2/83/1517  [name] [birth year] [id number]    Current Medications:  Current Outpatient Prescriptions  Medication Sig Dispense Refill  . albuterol (PROVENTIL HFA;VENTOLIN HFA) 108 (90 BASE) MCG/ACT inhaler Inhale 4 puffs into the lungs every 6 (six) hours as needed. For shortness of breath    . COD LIVER OIL PO Take 2 capsules by mouth daily.    . diphenhydrAMINE (BENADRYL) 25 MG tablet Take 1 tablet (25 mg total) by mouth every 6 (six) hours as needed. (Patient taking differently: Take 25 mg by mouth every 6 (six) hours as needed for itching or allergies. ) 30 tablet 0  . Emollient (NIVEA) cream Apply 1 application topically daily. Apply all over body    . EPINEPHrine (EPIPEN) 0.3 mg/0.3 mL DEVI Inject 0.3 mg into the muscle as needed. For allergic reaction    . ferrous sulfate 325 (65 FE) MG tablet Take 325 mg by mouth daily with breakfast.     . folic acid (FOLVITE) 546 MCG tablet Take 400 mcg by mouth daily.    . Garlic Oil 2703 MG CAPS Take 1,000 mg by mouth daily.    . hydrochlorothiazide (HYDRODIURIL) 25 MG tablet Take 1 tablet (25 mg total) by mouth daily. 30 tablet 3  . hydrocortisone cream 1 % Apply topically 2 (two) times daily. 30 g 0  . hydrOXYzine (ATARAX/VISTARIL) 25 MG tablet Take 1 tablet (25 mg total) by mouth every 4 (four) hours as needed for itching. 30 tablet 0  . lidocaine (XYLOCAINE) 4 % external solution Apply 5 mLs topically 4 (four) times daily. 50 mL 2  . lisinopril (PRINIVIL,ZESTRIL) 10 MG tablet Take 1 tablet (10 mg total) by mouth daily. 90 tablet 3  . Omega-3 Fatty Acids (FISH OIL) 500 MG CAPS Take 1,000 mg by mouth daily.    Marland Kitchen oxyCODONE (OXY IR/ROXICODONE) 5 MG immediate release tablet Take 1 tablet (5 mg total) by mouth every 4 (four) hours as needed for severe pain. 120 tablet 0  . pyridostigmine (MESTINON) 60 MG tablet Take 1 tablet (60 mg total) by mouth 4 (four) times daily. 360  tablet 4   No current facility-administered medications for this visit.    Functional Status:  In your present state of health, do you have any difficulty performing the following activities: 01/12/2014 12/20/2013  Is the patient deaf or have difficulty hearing? N N  Hearing Y Y  Vision N N  Difficulty concentrating or making decisions Y Y  Walking or climbing stairs? Y Y  Doing errands, shopping? [name]    Fall/Depression Screening:  PHQ 2/9 Scores 03/11/2014 02/11/2014 12/06/2013 08/30/2013  PHQ - 2 Score 0 0 0 0    Assessment:  CSW continues to be unable to establish phone contact with patient, after six phone attempts and outreach letter mailed to patients home.  Plan:  CSW will proceed with a case closure on patient due to inability to establish phone contact.  CSW will notify patients RNCM with [name], [name] of CSW's plans to close patients case.  CSW will also submit a closure letter to patients Primary Care Physician, Dr. Liston [name] to ensure that Dr. Zigmund [name] is aware of CSW's plans to close patients case, but that [name] will remain actively involved.  CSW will submit a case closure request to [name], Care [name] Assistant with [place] [name], in the form of a "To Do".  [name], BSW, MSW, [place] [name] [name], [name] [place], [name] [name] [phone]

## 2014-05-02 NOTE — Patient Outreach (Signed)
[place] [facility]) Care Management  7/0/9295  [name] [birth year] [id number]   Received notification from [name] she is closing CSW potion of the patient case due to inability to establish contact with patient.  [name]. [place] CM Assistant Phone: [phone] Fax: [phone]

## 2014-05-06 ENCOUNTER — Other Ambulatory Visit: Payer: Self-pay | Admitting: *Deleted

## 2014-05-06 NOTE — Patient Outreach (Signed)
This care manager called member to follow up on member's move and to obtain new residence.  Also called to confirm routine home visit scheduled for tomorrow.  Member reports that she still has not moved from her apartment and states that she has paid her rent up until tomorrow.  She states that she is waiting for the movers to come today to move out of the apartment.  She states that she still does not have anywhere definite to go, but will be living in motel until she can secure a more permanent place to live.  This care manager informs member that the social worker, J. Saporito, tried numerous times to contact her to assist with finding a new place, but she has had no success with making contact.  Member states that she had a list of resources that was provided to her (unsure of the source) for assistance and states that she has contacted then and is waiting for a response.    Discussed with member the routine home visit scheduled for tomorrow, but member is requesting to reschedule due to the move.  Member asked to contact this care manager when she is complete with her move and we will rescheduled the home visit at that time.  [name], BSN, [place] Management  [facility] Care Manager [phone]

## 2014-05-07 ENCOUNTER — Ambulatory Visit: Payer: Medicare Other | Admitting: *Deleted

## 2014-05-22 ENCOUNTER — Other Ambulatory Visit: Payer: Self-pay | Admitting: *Deleted

## 2014-05-22 ENCOUNTER — Telehealth: Payer: Self-pay | Admitting: Internal Medicine

## 2014-05-22 DIAGNOSIS — L97929 Non-pressure chronic ulcer of unspecified part of left lower leg with unspecified severity: Principal | ICD-10-CM

## 2014-05-22 DIAGNOSIS — L97919 Non-pressure chronic ulcer of unspecified part of right lower leg with unspecified severity: Secondary | ICD-10-CM

## 2014-05-22 DIAGNOSIS — M79606 Pain in leg, unspecified: Secondary | ICD-10-CM

## 2014-05-22 MED ORDER — OXYCODONE HCL 5 MG PO TABS: 5.0000 mg | ORAL_TABLET | ORAL | Status: DC | PRN

## 2014-05-22 NOTE — Patient Outreach (Signed)
Call placed to member to follow up on relocation status.  Member was to give this care manager a call when she was settled into a hotel or another apartment.  Last contact with member was 05/06/14.  HIPPA compliant voice message left.  Will await call back.  Will attempt to call member again next week.  [name], BSN, [place] Management  [facility] Care Manager [phone]

## 2014-05-22 NOTE — Telephone Encounter (Signed)
Pt taking analgesics for leg ulcers which remain open. She is scheduled for a visit in the office on 06/11/2014 at which time her need for continued analgesics will be re-evaluated. Prescription written for Oxycodone 5 mg #120 tabs tabs. NCCSRS reviewed and no inconsistencies noted.

## 2014-05-22 NOTE — Telephone Encounter (Signed)
Refill request for oxycodone 5mg . LOV 03/11/2014. Please advise. Thanks!

## 2014-05-28 ENCOUNTER — Other Ambulatory Visit: Payer: Self-pay | Admitting: *Deleted

## 2014-05-28 NOTE — Patient Outreach (Signed)
Call received back from member.  Member states that she still have not moved from her current apartment and will still be moving into a hotel.  She continues to state that she is waiting for the movers to come and help her move.  Member begins a conversation regarding her current health status and transportation back and forth to the doctors offices, but then suddenly states that she will call this care manager back.  Will await for call back.  [name], BSN, [place] Management  [facility] Care Manager [phone]

## 2014-05-28 NOTE — Patient Outreach (Signed)
Call placed to member for follow up on move and to schedule a routine home visit.  No answer, unable to leave voice message due to mailbox being full.  This was the second attempt since last verbal contact.  Will make third attempt next week, if unable to reach, will send outreach letter.  [name], BSN, [place] [facility]  [facility] Care Manager [phone]

## 2014-05-31 DIAGNOSIS — M0609 Rheumatoid arthritis without rheumatoid factor, multiple sites: Secondary | ICD-10-CM | POA: Diagnosis not present

## 2014-05-31 DIAGNOSIS — M2550 Pain in unspecified joint: Secondary | ICD-10-CM | POA: Diagnosis not present

## 2014-05-31 DIAGNOSIS — R768 Other specified abnormal immunological findings in serum: Secondary | ICD-10-CM | POA: Diagnosis not present

## 2014-05-31 DIAGNOSIS — G7000 Myasthenia gravis without (acute) exacerbation: Secondary | ICD-10-CM | POA: Diagnosis not present

## 2014-05-31 DIAGNOSIS — L88 Pyoderma gangrenosum: Secondary | ICD-10-CM | POA: Diagnosis not present

## 2014-06-04 DIAGNOSIS — Z111 Encounter for screening for respiratory tuberculosis: Secondary | ICD-10-CM | POA: Diagnosis not present

## 2014-06-11 ENCOUNTER — Ambulatory Visit (INDEPENDENT_AMBULATORY_CARE_PROVIDER_SITE_OTHER): Payer: Medicare Other | Admitting: Family Medicine

## 2014-06-11 ENCOUNTER — Encounter: Payer: Self-pay | Admitting: Family Medicine

## 2014-06-11 VITALS — BP 153/75 | HR 82 | Temp 98.3°F | Resp 18 | Ht 67.0 in | Wt 199.0 lb

## 2014-06-11 DIAGNOSIS — M069 Rheumatoid arthritis, unspecified: Secondary | ICD-10-CM | POA: Diagnosis not present

## 2014-06-11 DIAGNOSIS — L97919 Non-pressure chronic ulcer of unspecified part of right lower leg with unspecified severity: Secondary | ICD-10-CM | POA: Diagnosis not present

## 2014-06-11 DIAGNOSIS — G7000 Myasthenia gravis without (acute) exacerbation: Secondary | ICD-10-CM | POA: Diagnosis not present

## 2014-06-11 DIAGNOSIS — I10 Essential (primary) hypertension: Secondary | ICD-10-CM | POA: Diagnosis not present

## 2014-06-11 DIAGNOSIS — L97929 Non-pressure chronic ulcer of unspecified part of left lower leg with unspecified severity: Secondary | ICD-10-CM

## 2014-06-11 DIAGNOSIS — M79606 Pain in leg, unspecified: Secondary | ICD-10-CM

## 2014-06-11 LAB — POCT URINALYSIS DIP (DEVICE)
BILIRUBIN URINE: NEGATIVE
GLUCOSE, UA: NEGATIVE mg/dL
Ketones, ur: NEGATIVE mg/dL
Leukocytes, UA: NEGATIVE
Nitrite: NEGATIVE
Protein, ur: 30 mg/dL — AB
SPECIFIC GRAVITY, URINE: 1.02 (ref 1.005–1.030)
Urobilinogen, UA: 0.2 mg/dL (ref 0.0–1.0)
pH: 7 (ref 5.0–8.0)

## 2014-06-11 MED ORDER — OXYCODONE HCL 5 MG PO TABS: 5.0000 mg | ORAL_TABLET | ORAL | Status: DC | PRN

## 2014-06-11 MED ORDER — LISINOPRIL 20 MG PO TABS: 20.0000 mg | ORAL_TABLET | Freq: Every day | ORAL | Status: DC

## 2014-06-11 NOTE — Progress Notes (Signed)
Subjective:    Patient ID: [name], female    DOB: [birth year], 54 y.o.   MRN: [id number]  HPI [name] is a 54 y.o. female here today to follow up on BP.  She states that she is taking medications consistently. Patient denies dizziness chest pain, dyspnea, fatigue, orthopnea, palpitations, syncope and tachypnea.  Cardiovascular risk factors include: hypertension and sedentary lifestyle.  She is also followed by Dr. Lenna Gilford rheumatologist, for rheumatoid arthritis. She states that she is doing well on current medication regimen. She states that she was recently started on Prednisone 20 mg and Plaquenil. She reports that she did not start the Plaquenil due to cost constraints. She reports that she has a return appointment in 1 month. Patient reports periodic stiffness and tenderness primarily to hands of mild severity.  Symptoms are made worse by: cold exposure.  Symptoms are helped by current treatment regimen.    Patient denies headache, abdominal pain, cough, shortness of breath, and no new numbness, weakness, or tingling.  Past Medical History  Diagnosis Date  . Asthma   . Hypertension   . CHF (congestive heart failure)   . Myasthenia gravis   . Heart murmur   . Venous stasis   . DVT (deep venous thrombosis) 2000, 2004    Not on anticoagulation   . Fibroids   . CRF (chronic renal failure)   . Protein calorie malnutrition   . Hypokalemia   . Rheumatoid arthritis   . Metabolic acidosis, increased anion gap (IAG)   . Iron deficiency anemia   . Sepsis    History   Social History  . Marital Status: Single    Spouse Name: N/A  . Number of Children: N/A  . Years of Education: college   Occupational History  .      Disabled   Social History Main Topics  . Smoking status: Never Smoker   . Smokeless tobacco: Never Used  . Alcohol Use: No  . Drug Use: No  . Sexual Activity: No   Other Topics Concern  . Not on file   Social History Narrative   Patient lives  at home alone.   Disabled.   Education college.   Right handed.   Caffeine patient drinks coffee.   Review of Systems  Constitutional: Negative.  Negative for fatigue.  Eyes: Negative.   Respiratory: Positive for apnea.   Cardiovascular: Negative.   Gastrointestinal: Negative.   Endocrine: Negative.   Genitourinary: Negative.   Musculoskeletal: Negative.   Skin: Negative.   Allergic/Immunologic: Negative.   Neurological: Positive for dizziness.  Hematological: Negative.   Psychiatric/Behavioral: Negative.  Negative for suicidal ideas and sleep disturbance.       Objective:   Physical Exam  Constitutional: She is oriented to person, place, and time. She appears well-developed and well-nourished.  HENT:  Head: Normocephalic and atraumatic.  Right Ear: External ear normal.  Left Ear: External ear normal.  Nose: Nose normal.  Mouth/Throat: Oropharynx is clear and moist.  Eyes: Conjunctivae and EOM are normal. Pupils are equal, round, and reactive to light.  Neck: Normal range of motion. Neck supple.  Cardiovascular: Normal rate, regular rhythm, normal heart sounds and intact distal pulses.   Pulmonary/Chest: Effort normal and breath sounds normal.  Abdominal: Soft. Bowel sounds are normal.  Musculoskeletal: Normal range of motion.  Neurological: She is alert and oriented to person, place, and time. She has normal reflexes.  Skin: Skin is warm and dry.  Psychiatric: She has a normal  mood and affect. Her behavior is normal. Judgment and thought content normal.         BP 153/75 mmHg  Pulse 82  Temp(Src) 98.3 F (36.8 C) (Oral)  Resp 18  Ht 5\' 7"  (1.702 m)  Wt 199 lb (90.266 kg)  BMI 31.16 kg/m2  LMP 07/12/2011 Assessment & Plan:   1. Essential hypertension Blood pressure is not at goal today. Will increase Lisinopril to 20 mg daily. Will also check renal function to assess renal compromise.  - lisinopril (PRINIVIL,ZESTRIL) 20 MG tablet; Take 1 tablet (20 mg total)  by mouth daily.  Dispense: 30 tablet; Refill: 0 - COMPLETE METABOLIC PANEL WITH GFR - POCT urinalysis dipstick  2. Rheumatoid arthritis Patient is under the care of Dr. Lenna Gilford, rheumatologist. Patient was started on Prednisone and Plaquenil. Patient did not start Plaquenil due to financial constraints.   3. Ulcers of both lower legs Patient continues to have pain associated with chronic leg wounds. She continues to be followed by [facility].  - oxyCODONE (OXY IR/ROXICODONE) 5 MG immediate release tablet; Take 1 tablet (5 mg total) by mouth every 4 (four) hours as needed for severe pain.  Dispense: 120 tablet; Refill: 0  4. Pain of lower extremity, unspecified laterality Patient is under the care of [facility]. She is followed by wound center weekly. She states that she continues to change dressings daily.   We agreed on current medication and plan on titrating her medication dose. We discussed that pt is to receive her Schedule II prescriptions only from [name]. Pt is also aware that the prescription history is available to [name] online through the [facility] CSRS. We reminded Ms. Dominski that all patients receiving Schedule II narcotics must be seen for follow within one month of prescription being requested. Discussed the need to keep medicines in a safe locked location away from children or pets, and the need to report excess sedation or constipation, measures to avoid constipation, and policies related to early refills and stolen prescriptions. According to the [place] [facility] program, we have reviewed details related to analgesia, adverse effects, aberrant behaviors. - oxyCODONE (OXY IR/ROXICODONE) 5 MG immediate release tablet; Take 1 tablet (5 mg total) by mouth every 4 (four) hours as needed for severe pain.  Dispense: 120 tablet; Refill: 0   Preventative care:  Patient is scheduled with opthalmologist Reviewed previous labs from Dr. Lenna Gilford  RTC: 1 month for  hypertension

## 2014-06-11 NOTE — Patient Instructions (Signed)
DASH Eating Plan DASH stands for "Dietary Approaches to Stop Hypertension." The DASH eating plan is a healthy eating plan that has been shown to reduce high blood pressure (hypertension). Additional health benefits may include reducing the risk of type 2 diabetes mellitus, heart disease, and stroke. The DASH eating plan may also help with weight loss. WHAT DO I NEED TO KNOW ABOUT THE DASH EATING PLAN? For the DASH eating plan, you will follow these general guidelines:  Choose foods with a percent daily value for sodium of less than 5% (as listed on the food label).  Use salt-free seasonings or herbs instead of table salt or sea salt.  Check with your health care provider or pharmacist before using salt substitutes.  Eat lower-sodium products, often labeled as "lower sodium" or "no salt added."  Eat fresh foods.  Eat more vegetables, fruits, and low-fat dairy products.  Choose whole grains. Look for the word "whole" as the first word in the ingredient list.  Choose fish and skinless chicken or turkey more often than red meat. Limit fish, poultry, and meat to 6 oz (170 g) each day.  Limit sweets, desserts, sugars, and sugary drinks.  Choose heart-healthy fats.  Limit cheese to 1 oz (28 g) per day.  Eat more home-cooked food and less restaurant, buffet, and fast food.  Limit fried foods.  Cook foods using methods other than frying.  Limit canned vegetables. If you do use them, rinse them well to decrease the sodium.  When eating at a restaurant, ask that your food be prepared with less salt, or no salt if possible. WHAT FOODS CAN I EAT? Seek help from a dietitian for individual calorie needs. Grains Whole grain or whole wheat bread. Brown rice. Whole grain or whole wheat pasta. Quinoa, bulgur, and whole grain cereals. Low-sodium cereals. Corn or whole wheat flour tortillas. Whole grain cornbread. Whole grain crackers. Low-sodium crackers. Vegetables Fresh or frozen vegetables  (raw, steamed, roasted, or grilled). Low-sodium or reduced-sodium tomato and vegetable juices. Low-sodium or reduced-sodium tomato sauce and paste. Low-sodium or reduced-sodium canned vegetables.  Fruits All fresh, canned (in natural juice), or frozen fruits. Meat and Other Protein Products Ground beef (85% or leaner), grass-fed beef, or beef trimmed of fat. Skinless chicken or turkey. Ground chicken or turkey. Pork trimmed of fat. All fish and seafood. Eggs. Dried beans, peas, or lentils. Unsalted nuts and seeds. Unsalted canned beans. Dairy Low-fat dairy products, such as skim or 1% milk, 2% or reduced-fat cheeses, low-fat ricotta or cottage cheese, or plain low-fat yogurt. Low-sodium or reduced-sodium cheeses. Fats and Oils Tub margarines without trans fats. Light or reduced-fat mayonnaise and salad dressings (reduced sodium). Avocado. Safflower, olive, or canola oils. Natural peanut or almond butter. Other Unsalted popcorn and pretzels. The items listed above may not be a complete list of recommended foods or beverages. Contact your dietitian for more options. WHAT FOODS ARE NOT RECOMMENDED? Grains White bread. White pasta. White rice. Refined cornbread. Bagels and croissants. Crackers that contain trans fat. Vegetables Creamed or fried vegetables. Vegetables in a cheese sauce. Regular canned vegetables. Regular canned tomato sauce and paste. Regular tomato and vegetable juices. Fruits Dried fruits. Canned fruit in light or heavy syrup. Fruit juice. Meat and Other Protein Products Fatty cuts of meat. Ribs, chicken wings, bacon, sausage, bologna, salami, chitterlings, fatback, hot dogs, bratwurst, and packaged luncheon meats. Salted nuts and seeds. Canned beans with salt. Dairy Whole or 2% milk, cream, half-and-half, and cream cheese. Whole-fat or sweetened yogurt. Full-fat   cheeses or blue cheese. Nondairy creamers and whipped toppings. Processed cheese, cheese spreads, or cheese  curds. Condiments Onion and garlic salt, seasoned salt, table salt, and sea salt. Canned and packaged gravies. Worcestershire sauce. Tartar sauce. Barbecue sauce. Teriyaki sauce. Soy sauce, including reduced sodium. Steak sauce. Fish sauce. Oyster sauce. Cocktail sauce. Horseradish. Ketchup and mustard. Meat flavorings and tenderizers. Bouillon cubes. Hot sauce. Tabasco sauce. Marinades. Taco seasonings. Relishes. Fats and Oils Butter, stick margarine, lard, shortening, ghee, and bacon fat. Coconut, palm kernel, or palm oils. Regular salad dressings. Other Pickles and olives. Salted popcorn and pretzels. The items listed above may not be a complete list of foods and beverages to avoid. Contact your dietitian for more information. WHERE CAN I FIND MORE INFORMATION? National Heart, Lung, and Blood Institute: www.nhlbi.nih.gov/health/health-topics/topics/dash/ Document Released: 12/31/2010 Document Revised: 05/28/2013 Document Reviewed: 11/15/2012 ExitCare Patient Information 2015 [employer]. This information is not intended to replace advice given to you by your health care provider. Make sure you discuss any questions you have with your health care provider. Hypertension Hypertension, commonly called high blood pressure, is when the force of blood pumping through your arteries is too strong. Your arteries are the blood vessels that carry blood from your heart throughout your body. A blood pressure reading consists of a higher number over a lower number, such as 110/72. The higher number (systolic) is the pressure inside your arteries when your heart pumps. The lower number (diastolic) is the pressure inside your arteries when your heart relaxes. Ideally you want your blood pressure below 120/80. Hypertension forces your heart to work harder to pump blood. Your arteries may become narrow or stiff. Having hypertension puts you at risk for heart disease, stroke, and other problems.  RISK  FACTORS Some risk factors for high blood pressure are controllable. Others are not.  Risk factors you cannot control include:   Race. You may be at higher risk if you are African American.  Age. Risk increases with age.  Gender. Men are at higher risk than women before age 45 years. After age 65, women are at higher risk than men. Risk factors you can control include:  Not getting enough exercise or physical activity.  Being overweight.  Getting too much fat, sugar, calories, or salt in your diet.  Drinking too much alcohol. SIGNS AND SYMPTOMS Hypertension does not usually cause signs or symptoms. Extremely high blood pressure (hypertensive crisis) may cause headache, anxiety, shortness of breath, and nosebleed. DIAGNOSIS  To check if you have hypertension, your health care provider will measure your blood pressure while you are seated, with your arm held at the level of your heart. It should be measured at least twice using the same arm. Certain conditions can cause a difference in blood pressure between your right and left arms. A blood pressure reading that is higher than normal on one occasion does not mean that you need treatment. If one blood pressure reading is high, ask your health care provider about having it checked again. TREATMENT  Treating high blood pressure includes making lifestyle changes and possibly taking medicine. Living a healthy lifestyle can help lower high blood pressure. You may need to change some of your habits. Lifestyle changes may include:  Following the DASH diet. This diet is high in fruits, vegetables, and whole grains. It is low in salt, red meat, and added sugars.  Getting at least 2 hours of brisk physical activity every week.  Losing weight if necessary.  Not smoking.  Limiting   alcoholic beverages.  Learning ways to reduce stress. If lifestyle changes are not enough to get your blood pressure under control, your health care provider may  prescribe medicine. You may need to take more than one. Work closely with your health care provider to understand the risks and benefits. HOME CARE INSTRUCTIONS  Have your blood pressure rechecked as directed by your health care provider.   Take medicines only as directed by your health care provider. Follow the directions carefully. Blood pressure medicines must be taken as prescribed. The medicine does not work as well when you skip doses. Skipping doses also puts you at risk for problems.   Do not smoke.   Monitor your blood pressure at home as directed by your health care provider. SEEK MEDICAL CARE IF:   You think you are having a reaction to medicines taken.  You have recurrent headaches or feel dizzy.  You have swelling in your ankles.  You have trouble with your vision. SEEK IMMEDIATE MEDICAL CARE IF:  You develop a severe headache or confusion.  You have unusual weakness, numbness, or feel faint.  You have severe chest or abdominal pain.  You vomit repeatedly.  You have trouble breathing. MAKE SURE YOU:   Understand these instructions.  Will watch your condition.  Will get help right away if you are not doing well or get worse. Document Released: 01/11/2005 Document Revised: 05/28/2013 Document Reviewed: 11/03/2012 ExitCare Patient Information 2015 ExitCare, LLC. This information is not intended to replace advice given to you by your health care provider. Make sure you discuss any questions you have with your health care provider.  

## 2014-06-12 LAB — COMPLETE METABOLIC PANEL WITH GFR
ALBUMIN: 3.4 g/dL — AB (ref 3.5–5.2)
ALT: 9 U/L (ref 0–35)
AST: 10 U/L (ref 0–37)
Alkaline Phosphatase: 50 U/L (ref 39–117)
BUN: 38 mg/dL — ABNORMAL HIGH (ref 6–23)
CALCIUM: 8.7 mg/dL (ref 8.4–10.5)
CO2: 27 meq/L (ref 19–32)
CREATININE: 1.14 mg/dL — AB (ref 0.50–1.10)
Chloride: 102 mEq/L (ref 96–112)
GFR, Est African American: 63 mL/min
GFR, Est Non African American: 54 mL/min — ABNORMAL LOW
Glucose, Bld: 101 mg/dL — ABNORMAL HIGH (ref 70–99)
POTASSIUM: 4.9 meq/L (ref 3.5–5.3)
SODIUM: 138 meq/L (ref 135–145)
TOTAL PROTEIN: 7.2 g/dL (ref 6.0–8.3)
Total Bilirubin: 0.3 mg/dL (ref 0.2–1.2)

## 2014-06-13 DIAGNOSIS — L89892 Pressure ulcer of other site, stage 2: Secondary | ICD-10-CM | POA: Diagnosis not present

## 2014-06-13 DIAGNOSIS — L97829 Non-pressure chronic ulcer of other part of left lower leg with unspecified severity: Secondary | ICD-10-CM | POA: Diagnosis not present

## 2014-06-13 DIAGNOSIS — T148 Other injury of unspecified body region: Secondary | ICD-10-CM | POA: Diagnosis not present

## 2014-06-13 DIAGNOSIS — I890 Lymphedema, not elsewhere classified: Secondary | ICD-10-CM | POA: Diagnosis not present

## 2014-06-13 DIAGNOSIS — B369 Superficial mycosis, unspecified: Secondary | ICD-10-CM | POA: Diagnosis not present

## 2014-06-13 DIAGNOSIS — I83028 Varicose veins of left lower extremity with ulcer other part of lower leg: Secondary | ICD-10-CM | POA: Diagnosis not present

## 2014-06-13 DIAGNOSIS — L89324 Pressure ulcer of left buttock, stage 4: Secondary | ICD-10-CM | POA: Diagnosis not present

## 2014-06-13 DIAGNOSIS — I83018 Varicose veins of right lower extremity with ulcer other part of lower leg: Secondary | ICD-10-CM | POA: Diagnosis not present

## 2014-06-13 DIAGNOSIS — I87012 Postthrombotic syndrome with ulcer of left lower extremity: Secondary | ICD-10-CM | POA: Diagnosis not present

## 2014-06-13 DIAGNOSIS — I872 Venous insufficiency (chronic) (peripheral): Secondary | ICD-10-CM | POA: Diagnosis not present

## 2014-06-13 DIAGNOSIS — R5381 Other malaise: Secondary | ICD-10-CM | POA: Diagnosis not present

## 2014-06-13 DIAGNOSIS — L03116 Cellulitis of left lower limb: Secondary | ICD-10-CM | POA: Diagnosis not present

## 2014-06-13 DIAGNOSIS — I87011 Postthrombotic syndrome with ulcer of right lower extremity: Secondary | ICD-10-CM | POA: Diagnosis not present

## 2014-06-13 DIAGNOSIS — L97819 Non-pressure chronic ulcer of other part of right lower leg with unspecified severity: Secondary | ICD-10-CM | POA: Diagnosis not present

## 2014-06-17 ENCOUNTER — Encounter: Payer: Self-pay | Admitting: *Deleted

## 2014-06-18 ENCOUNTER — Other Ambulatory Visit: Payer: Self-pay | Admitting: *Deleted

## 2014-06-18 NOTE — Patient Outreach (Signed)
Remain unable to contact member, member has made no attempt to call this care manager back as she stated she would.  Outreach letter sent.  If no response in 10 days, will close case.  [name], BSN, [place] [facility]  [facility] Care Manager [phone]

## 2014-06-28 ENCOUNTER — Encounter: Payer: Self-pay | Admitting: Internal Medicine

## 2014-06-28 ENCOUNTER — Other Ambulatory Visit: Payer: Self-pay | Admitting: Family Medicine

## 2014-06-28 MED ORDER — HYDROCHLOROTHIAZIDE 25 MG PO TABS: 25.0000 mg | ORAL_TABLET | Freq: Every day | ORAL | Status: DC

## 2014-07-01 ENCOUNTER — Encounter: Payer: Self-pay | Admitting: *Deleted

## 2014-07-02 ENCOUNTER — Ambulatory Visit (INDEPENDENT_AMBULATORY_CARE_PROVIDER_SITE_OTHER): Payer: Medicare Other | Admitting: Internal Medicine

## 2014-07-02 ENCOUNTER — Encounter: Payer: Self-pay | Admitting: Internal Medicine

## 2014-07-02 VITALS — BP 122/70 | HR 88 | Ht 65.0 in | Wt 199.0 lb

## 2014-07-02 DIAGNOSIS — Z1211 Encounter for screening for malignant neoplasm of colon: Secondary | ICD-10-CM | POA: Diagnosis not present

## 2014-07-02 MED ORDER — NA SULFATE-K SULFATE-MG SULF 17.5-3.13-1.6 GM/177ML PO SOLN: 1.0000 | Freq: Once | ORAL | Status: AC

## 2014-07-02 NOTE — Progress Notes (Signed)
[name] [birth year] [id number]  Note: This dictation was prepared with Dragon digital system. Any transcriptional errors that result from this procedure are unintentional.   History of Present Illness: This is a 54 year old African-American female here to discuss screening colonoscopy. She has never had one. She is having regular bowel movements. There is no family history of colon cancer. She denies rectal bleeding or abdominal pain. Patient has multiple medical problems including hypertension, congestive heart failure, myasthenia gravis and deforming rheumatoid arthritis. She is not on any anticoagulation. She reports having problems with general anesthesia  while having D&C, tonsillectomy and myomectomy.    Past Medical History  Diagnosis Date  . Asthma   . Hypertension   . CHF (congestive heart failure)   . Myasthenia gravis   . Heart murmur   . Venous stasis     bilateral  . DVT (deep venous thrombosis) 2000, 2004    Not on anticoagulation   . Fibroids   . CRF (chronic renal failure)   . Protein calorie malnutrition   . Hypokalemia   . Rheumatoid arthritis   . Metabolic acidosis, increased anion gap (IAG)   . Iron deficiency anemia   . Sepsis   . Glaucoma     Past Surgical History  Procedure Laterality Date  . Tonsillectomy and adenoidectomy  1964  . Myomectomy  11/1998  . Failed bilateral leg grafts    . Bronchoscopy      Allergies  Allergen Reactions  . Asa [Aspirin] Shortness Of Breath, Rash and Anaphylaxis  . Cat Hair Extract Anaphylaxis  . Dilaudid [Hydromorphone Hcl] Shortness Of Breath and Palpitations  . Ibuprofen Anaphylaxis  . Tylenol [Acetaminophen] Rash, Other (See Comments) and Anaphylaxis    Other reaction(s): SHORTNESS OF BREATH Salivation and hearing loss  . Ciprofloxacin     Other reaction(s): OTHER  . Dicloxacillin     Other reaction(s): OTHER  . Erythromycin Other (See Comments)    Burn veins  . Hydromorphone     Other  reaction(s): OTHER  . Indomethacin Other (See Comments)    Other reaction(s): OTHER Stroke symptoms  . Naproxen Other (See Comments)    Other reaction(s): OTHER Renal failure  . Nsaids Other (See Comments)    Kidney failure   . Tramadol     "stroke"  . Alginate-Collagen [Wound Dressings] Rash  . Gold-Containing Drug Products Rash  . Silvadene [Silver Sulfadiazine] Rash    Family history and social history have been reviewed.  Review of Systems: Negative for nausea vomiting, abdominal pain or weight loss The remainder of the 10 point ROS is negative except as outlined in the H&P  Physical Exam: General Appearance Well developed, in no distress, partially known ambulatory. Has deformities of both extremities due to arthritis Eyes  Non icteric  HEENT  Non traumatic, normocephalic  Mouth No lesion, tongue papillated, no cheilosis Neck Supple without adenopathy, thyroid not enlarged, no carotid bruits, no JVD Lungs Clear to auscultation bilaterally COR Normal S1, normal S2, regular rhythm, no murmur, quiet precordium Abdomen soft nontender, protuberant. Normoactive bowel sounds Rectal decreased  rectal sphincter tone. Soft Hemoccult-negative stool Extremities   2+ pedal edema, Skin No lesions, deformities of lower extremities due to rheumatoid arthritis Neurological Alert and oriented x 3 Psychological Normal mood and affect  Assessment and Plan:   54 year old African-American female with multiple medical problems who is the here to discuss her first screening colonoscopy. She is an increased risk for sedation because of  myasthenia gravis. She does not  have increase the risk for colon cancer. She is Hemoccult-negative. We will schedule  screening colonoscopy at [facility] with propofol and possible general anesthesia.as per CRNA assessment. . We have discussed prep as well as sedation and the procedure itself.  Referred by Dr Verlan Friends    Delfin Edis 07/02/2014

## 2014-07-02 NOTE — Patient Instructions (Signed)
You have been scheduled for a colonoscopy. Please follow written instructions given to you at your visit today.  Please pick up your prep supplies at the pharmacy within the next 1-3 days. If you use inhalers (even only as needed), please bring them with you on the day of your procedure.  cc: Liston Alba, MD

## 2014-07-02 NOTE — Patient Outreach (Signed)
[place] [facility]) Care Management  06[birth year]  [name] [birth year] [id number]   [name], RN closing case due to unable to maintain contact with patient.  [name]. [place], [place] [facility] [place] Assistant Phone: [phone] Fax: [phone]

## 2014-07-04 DIAGNOSIS — L89892 Pressure ulcer of other site, stage 2: Secondary | ICD-10-CM | POA: Diagnosis not present

## 2014-07-04 DIAGNOSIS — L97829 Non-pressure chronic ulcer of other part of left lower leg with unspecified severity: Secondary | ICD-10-CM | POA: Diagnosis not present

## 2014-07-04 DIAGNOSIS — L03116 Cellulitis of left lower limb: Secondary | ICD-10-CM | POA: Diagnosis not present

## 2014-07-04 DIAGNOSIS — L97819 Non-pressure chronic ulcer of other part of right lower leg with unspecified severity: Secondary | ICD-10-CM | POA: Diagnosis not present

## 2014-07-04 DIAGNOSIS — I83018 Varicose veins of right lower extremity with ulcer other part of lower leg: Secondary | ICD-10-CM | POA: Diagnosis not present

## 2014-07-04 DIAGNOSIS — I83028 Varicose veins of left lower extremity with ulcer other part of lower leg: Secondary | ICD-10-CM | POA: Diagnosis not present

## 2014-07-04 DIAGNOSIS — I890 Lymphedema, not elsewhere classified: Secondary | ICD-10-CM | POA: Diagnosis not present

## 2014-07-16 ENCOUNTER — Ambulatory Visit: Payer: Medicare Other | Admitting: Family Medicine

## 2014-07-18 ENCOUNTER — Other Ambulatory Visit: Payer: Self-pay | Admitting: Family Medicine

## 2014-07-18 ENCOUNTER — Other Ambulatory Visit: Payer: Self-pay | Admitting: Internal Medicine

## 2014-07-18 DIAGNOSIS — L97929 Non-pressure chronic ulcer of unspecified part of left lower leg with unspecified severity: Principal | ICD-10-CM

## 2014-07-18 DIAGNOSIS — L97919 Non-pressure chronic ulcer of unspecified part of right lower leg with unspecified severity: Secondary | ICD-10-CM

## 2014-07-18 DIAGNOSIS — M79606 Pain in leg, unspecified: Secondary | ICD-10-CM

## 2014-07-18 MED ORDER — OXYCODONE HCL 5 MG PO TABS: 5.0000 mg | ORAL_TABLET | ORAL | Status: DC | PRN

## 2014-07-18 NOTE — Telephone Encounter (Signed)
Refill request for oxycodone.  

## 2014-07-25 ENCOUNTER — Ambulatory Visit: Payer: Medicare Other | Admitting: Family Medicine

## 2014-07-26 ENCOUNTER — Telehealth: Payer: Self-pay | Admitting: Internal Medicine

## 2014-07-26 NOTE — Telephone Encounter (Signed)
Patient unable to get a ride for 08/12/14 colonoscopy at [facility]. She will call and reschedule in September or October. Cancelled procedure at [facility] endo.

## 2014-07-27 NOTE — Telephone Encounter (Signed)
Colonoscopy canceled. Increased risk for sedation. She was referred by Dr Rodena Piety. Please let Dr Jerilynn Mages. Office know about her cancellation.

## 2014-07-30 NOTE — Telephone Encounter (Signed)
Spoke with [name] office and left a message for her that patient has scheduled.

## 2014-08-07 ENCOUNTER — Ambulatory Visit: Payer: Medicare Other | Admitting: Family Medicine

## 2014-08-08 ENCOUNTER — Ambulatory Visit (INDEPENDENT_AMBULATORY_CARE_PROVIDER_SITE_OTHER): Payer: Medicare Other | Admitting: Family Medicine

## 2014-08-08 ENCOUNTER — Encounter: Payer: Self-pay | Admitting: Family Medicine

## 2014-08-08 VITALS — BP 138/68 | HR 77 | Temp 98.2°F | Resp 16 | Ht 67.0 in | Wt 201.0 lb

## 2014-08-08 DIAGNOSIS — I10 Essential (primary) hypertension: Secondary | ICD-10-CM | POA: Diagnosis not present

## 2014-08-08 MED ORDER — LISINOPRIL 20 MG PO TABS: 20.0000 mg | ORAL_TABLET | Freq: Every day | ORAL | Status: DC

## 2014-08-08 MED ORDER — HYDROCHLOROTHIAZIDE 25 MG PO TABS
25.0000 mg | ORAL_TABLET | Freq: Every day | ORAL | Status: DC
Start: 2014-08-08 — End: 2014-11-08

## 2014-08-08 NOTE — Patient Instructions (Signed)
Continue lisinopril and hctz at current dosage. Call and make an appointment for mammogram Make an appointment to have a PAP smear in 3 month when you come in for BP check and bloodwork. Keep appointment with Wound center We will do referral to pain clinic. Someone will call you. Return as needed.

## 2014-08-08 NOTE — Progress Notes (Signed)
Patient ID: [name], female   DOB: 08-07-1960, 54 y.o.   MRN: [id number]   [name], is a 54 y.o. female  OVF:[id number]  ACZ:[id number]  DOB - 1960/09/27  CC: No chief complaint on file.      HPI: [name] is a 54 y.o. female here for follow-up hypertension. We was here and had labs in mid May. She has chronic pain from back and extremeties. She see rheumatologist Dr. Berna Bue. She is on chronic narcotic use for her chronic pain. She reports not being able to take NSAIDS, Tylenol #3, or Tramadol due to unacctptable side effects.She followed at wound center in [facility] for chronic leg ulcers and has an appointment there next week. She has a colonospcopy set un in next few weeks at [place]. She has MS and is on generic mestinon. She has many allergies and has an epi-pen.She Has chronic renal failure, venous stasis amoung other conditions. She has difficulty ambulating. Needing a form for SCAT due to her disabilty to ambulate well.  Allergies  Allergen Reactions  . Asa [Aspirin] Shortness Of Breath, Rash and Anaphylaxis  . Cat Hair Extract Anaphylaxis  . Dilaudid [Hydromorphone Hcl] Shortness Of Breath and Palpitations  . Ibuprofen Anaphylaxis  . Tylenol [Acetaminophen] Rash, Other (See Comments) and Anaphylaxis    Other reaction(s): SHORTNESS OF BREATH Salivation and hearing loss  . Ciprofloxacin     Other reaction(s): OTHER  . Dicloxacillin     Other reaction(s): OTHER  . Erythromycin Other (See Comments)    Burn veins  . Hydromorphone     Other reaction(s): OTHER  . Indomethacin Other (See Comments)    Other reaction(s): OTHER Stroke symptoms  . Naproxen Other (See Comments)    Other reaction(s): OTHER Renal failure  . Nsaids Other (See Comments)    Kidney failure   . Tramadol     "stroke"  . Alginate-Collagen [Wound Dressings] Rash  . Gold-Containing Drug Products Rash  . Silvadene [Silver Sulfadiazine] Rash   Past Medical History  Diagnosis Date   . Asthma   . Hypertension   . CHF (congestive heart failure)   . Myasthenia gravis   . Heart murmur   . Venous stasis     bilateral  . DVT (deep venous thrombosis) 2000, 2004    Not on anticoagulation   . Fibroids   . CRF (chronic renal failure)   . Protein calorie malnutrition   . Hypokalemia   . Rheumatoid arthritis   . Metabolic acidosis, increased anion gap (IAG)   . Iron deficiency anemia   . Sepsis   . Glaucoma    Current Outpatient Prescriptions on File Prior to Visit  Medication Sig Dispense Refill  . albuterol (PROVENTIL HFA;VENTOLIN HFA) 108 (90 BASE) MCG/ACT inhaler Inhale 4 puffs into the lungs every 6 (six) hours as needed. For shortness of breath    . COD LIVER OIL PO Take 2 capsules by mouth daily.    . diphenhydrAMINE (BENADRYL) 25 MG tablet Take 1 tablet (25 mg total) by mouth every 6 (six) hours as needed. (Patient taking differently: Take 25 mg by mouth every 6 (six) hours as needed for itching or allergies. ) 30 tablet 0  . Emollient (NIVEA) cream Apply 1 application topically daily. Apply all over body    . EPINEPHrine (EPIPEN) 0.3 mg/0.3 mL DEVI Inject 0.3 mg into the muscle as needed. For allergic reaction    . ferrous sulfate 325 (65 FE) MG tablet Take 325 mg by mouth  daily with breakfast.     . folic acid (FOLVITE) 329 MCG tablet Take 400 mcg by mouth daily.    . Garlic Oil 9242 MG CAPS Take 1,000 mg by mouth daily.    . hydrochlorothiazide (HYDRODIURIL) 25 MG tablet Take 1 tablet (25 mg total) by mouth daily. 30 tablet 3  . hydrocortisone cream 1 % Apply topically 2 (two) times daily. 30 g 0  . hydrOXYzine (ATARAX/VISTARIL) 25 MG tablet Take 1 tablet (25 mg total) by mouth every 4 (four) hours as needed for itching. 30 tablet 0  . lidocaine (XYLOCAINE) 4 % external solution Apply 5 mLs topically 4 (four) times daily. 50 mL 2  . lisinopril (PRINIVIL,ZESTRIL) 20 MG tablet Take 1 tablet (20 mg total) by mouth daily. 30 tablet 0  . Omega-3 Fatty Acids (FISH  OIL) 500 MG CAPS Take 1,000 mg by mouth daily.    Marland Kitchen oxyCODONE (OXY IR/ROXICODONE) 5 MG immediate release tablet Take 1 tablet (5 mg total) by mouth every 4 (four) hours as needed for severe pain. 120 tablet 0  . predniSONE (DELTASONE) 20 MG tablet Take 20 mg by mouth daily with breakfast.    . pyridostigmine (MESTINON) 60 MG tablet Take 1 tablet (60 mg total) by mouth 4 (four) times daily. 360 tablet 4   No current facility-administered medications on file prior to visit.   Family History  Problem Relation Age of Onset  . Depression Mother   . Breast cancer Mother   . CVA Father   . Atrial fibrillation Mother   . Stroke Mother   . Diabetes Father   . Diabetes Paternal Aunt   . Heart disease Mother   . Heart disease Paternal Grandfather   . Heart disease Paternal Grandmother   . Heart disease Maternal Uncle     x 2   History   Social History  . Marital Status: Single    Spouse Name: N/A  . Number of Children: 0  . Years of Education: college   Occupational History  .      Disabled   Social History Main Topics  . Smoking status: Never Smoker   . Smokeless tobacco: Never Used  . Alcohol Use: No  . Drug Use: No  . Sexual Activity: No   Other Topics Concern  . Not on file   Social History Narrative   Patient lives at home alone.   Disabled.   Education college.   Right handed.   Caffeine patient drinks coffee.    Review of Systems: Constitutional: Negative for fever, chills, appetite change, weight loss,  Fatigue. Has had some recent weight gain Skin: rashes from allergies at times, chronic leg ulcers. HENT: Negative for ear pain, ear discharge.nose bleeds Eyes: Negative for pain, discharge, redness, itching and visual disturbance. Wears glasses Neck: Negative for pain, stiffness Respiratory: Negative for cough, shortness of breath,  Has little trouble with Asthma. Heat seems to be her major trigger for shortness of breath. Cardiovascular: Negative for chest  pain, palpitations and leg swelling. Gastrointestinal: Negative for abdominal distention, abdominal pain, nausea, vomiting, diarrhea, constipations Genitourinary: Negative for dysuria, urgency, frequency, hematuria, flank pain,  Musculoskeletal: Positive for back pain, joint pain, joint  swelling, arthralgia and gait problem.Negative for weakness. Neurological: Negative for dizziness, tremors, seizures, syncope,   light-headedness, numbness and headaches.  Hematological: Negative for easy bruising or bleeding Psychiatric/Behavioral: Negative for depression, anxiety, decreased concentration, confusion    Objective:  There were no vitals filed for this visit.  Physical Exam: Constitutional: Patient appears well-developed and well-nourished. No distress. HENT: Normocephalic, atraumatic, External right and left ear normal. Oropharynx is clear and moist.  Eyes: Conjunctivae and EOM are normal. PERRLA, no scleral icterus. Neck: Normal ROM. Neck supple. No lymphadenopathy, No thyromegaly. CVS: RRR, S1/S2 +, no murmurs, no gallops, no rubs Pulmonary: Effort and breath sounds normal, no stridor, rhonchi, wheezes, rales.  Abdominal: Soft. Normoactive BS,, no distension, tenderness, rebound or guarding.  Musculoskeletal: Decrease range of motion of motion of lower extremeties with difficulty getting on and off stretcher and ambulating range of motion. No edema and no tenderness.  Neuro: Alert.Normal muscle tone coordination. Non-focal Skin: Skin is warm and dry. No rash noted. Not diaphoretic. No erythema. No pallor.Her lower legs are wrapped  And will not examinie today. She is due for a wound care visit next week.  Lab Results  Component Value Date   WBC 12.4* 01/13/2014   HGB 9.8* 01/13/2014   HCT 31.0* 01/13/2014   MCV 69.2* 01/13/2014   PLT 277 01/13/2014   Lab Results  Component Value Date   CREATININE 1.14* 06/11/2014   BUN 38* 06/11/2014   NA 138 06/11/2014   K 4.9 06/11/2014   CL  102 06/11/2014   CO2 27 06/11/2014    Lab Results  Component Value Date   HGBA1C 6.0* 12/20/2013   Lipid Panel     Component Value Date/Time   CHOL  08/03/2008 1105    75        ATP III CLASSIFICATION:  <200     mg/dL   Desirable  200-239  mg/dL   Borderline High  >=240    mg/dL   High          TRIG 110 08/03/2008 1105   HDL 38* 08/03/2008 1105   CHOLHDL 2.0 08/03/2008 1105   VLDL 22 08/03/2008 1105   LDLCALC  08/03/2008 1105    15        Total Cholesterol/HDL:CHD Risk Coronary Heart Disease Risk Table                     Men   Women  1/2 Average Risk   3.4   3.3  Average Risk       5.0   4.4  2 X Average Risk   9.6   7.1  3 X Average Risk  23.4   11.0        Use the calculated Patient Ratio above and the CHD Risk Table to determine the patient's CHD Risk.        ATP III CLASSIFICATION (LDL):  <100     mg/dL   Optimal  100-129  mg/dL   Near or Above                    Optimal  130-159  mg/dL   Borderline  160-189  mg/dL   High  >190     mg/dL   Very High       Assessment and plan:   Hypertension -Will continue current dosage of lisinipril and hctz -Have sent in a refill  Arthritis and pain -Follow-up with Dr. Lenna Gilford as planned -As we will be unable to continue with chronic narcotic, will refer to pain clinic -Will provide narcotic pain management until that appointment  MS -follow-up with specialist as planned  Need for colonoscopy -Keep appointment already set  Need for PAP and mammogram -I have recommended tht she call and make an  appointment for mammogram. -We will do PAP when she is here next in three months. No Follow-up on file.  The patient was given clear instructions to go to ER or return to medical center if symptoms don't improve, worsen or new problems develop. The patient verbalized understanding. The patient was told to call to get lab results if they haven't heard anything in the next week.       Micheline Chapman, MSN,  FNP-BC   08/08/2014, 1:02 PM

## 2014-08-12 ENCOUNTER — Ambulatory Visit (HOSPITAL_COMMUNITY): Admission: RE | Admit: 2014-08-12 | Payer: Medicare Other | Source: Ambulatory Visit | Admitting: Internal Medicine

## 2014-08-12 ENCOUNTER — Encounter (HOSPITAL_COMMUNITY): Admission: RE | Payer: Self-pay | Source: Ambulatory Visit

## 2014-08-12 SURGERY — COLONOSCOPY WITH PROPOFOL
Anesthesia: Monitor Anesthesia Care

## 2014-08-20 ENCOUNTER — Other Ambulatory Visit: Payer: Self-pay | Admitting: Internal Medicine

## 2014-08-20 DIAGNOSIS — M79606 Pain in leg, unspecified: Secondary | ICD-10-CM

## 2014-08-20 DIAGNOSIS — L97929 Non-pressure chronic ulcer of unspecified part of left lower leg with unspecified severity: Principal | ICD-10-CM

## 2014-08-20 DIAGNOSIS — L97919 Non-pressure chronic ulcer of unspecified part of right lower leg with unspecified severity: Secondary | ICD-10-CM

## 2014-08-21 ENCOUNTER — Other Ambulatory Visit: Payer: Self-pay | Admitting: Internal Medicine

## 2014-08-23 ENCOUNTER — Other Ambulatory Visit: Payer: Self-pay | Admitting: Family Medicine

## 2014-08-23 ENCOUNTER — Other Ambulatory Visit (HOSPITAL_COMMUNITY): Payer: Self-pay | Admitting: Family Medicine

## 2014-08-23 DIAGNOSIS — M79606 Pain in leg, unspecified: Secondary | ICD-10-CM

## 2014-08-23 DIAGNOSIS — L97919 Non-pressure chronic ulcer of unspecified part of right lower leg with unspecified severity: Secondary | ICD-10-CM

## 2014-08-23 DIAGNOSIS — L97929 Non-pressure chronic ulcer of unspecified part of left lower leg with unspecified severity: Principal | ICD-10-CM

## 2014-08-23 MED ORDER — OXYCODONE HCL 5 MG PO TABS: 5.0000 mg | ORAL_TABLET | ORAL | Status: DC | PRN

## 2014-08-23 NOTE — Telephone Encounter (Signed)
Refill completed and patient picked up

## 2014-09-26 ENCOUNTER — Other Ambulatory Visit: Payer: Self-pay | Admitting: Family Medicine

## 2014-09-26 DIAGNOSIS — L97919 Non-pressure chronic ulcer of unspecified part of right lower leg with unspecified severity: Secondary | ICD-10-CM

## 2014-09-26 DIAGNOSIS — M79606 Pain in leg, unspecified: Secondary | ICD-10-CM

## 2014-09-26 DIAGNOSIS — L97929 Non-pressure chronic ulcer of unspecified part of left lower leg with unspecified severity: Principal | ICD-10-CM

## 2014-09-26 MED ORDER — OXYCODONE HCL 5 MG PO TABS
5.0000 mg | ORAL_TABLET | ORAL | Status: DC | PRN
Start: 2014-09-26 — End: 2014-11-08

## 2014-09-26 NOTE — Telephone Encounter (Signed)
Refill request for oxycodone 5mg . LOV 08/08/2014. Please advise. Thanks!

## 2014-11-05 ENCOUNTER — Other Ambulatory Visit: Payer: Self-pay | Admitting: Neurology

## 2014-11-08 ENCOUNTER — Ambulatory Visit (INDEPENDENT_AMBULATORY_CARE_PROVIDER_SITE_OTHER): Payer: Medicare Other | Admitting: Family Medicine

## 2014-11-08 VITALS — BP 153/77 | HR 112 | Temp 98.2°F | Resp 22 | Ht 67.0 in | Wt 234.0 lb

## 2014-11-08 DIAGNOSIS — G8929 Other chronic pain: Secondary | ICD-10-CM | POA: Diagnosis not present

## 2014-11-08 DIAGNOSIS — R739 Hyperglycemia, unspecified: Secondary | ICD-10-CM | POA: Diagnosis not present

## 2014-11-08 DIAGNOSIS — D638 Anemia in other chronic diseases classified elsewhere: Secondary | ICD-10-CM | POA: Diagnosis not present

## 2014-11-08 DIAGNOSIS — M79606 Pain in leg, unspecified: Secondary | ICD-10-CM | POA: Diagnosis not present

## 2014-11-08 DIAGNOSIS — R7309 Other abnormal glucose: Secondary | ICD-10-CM | POA: Diagnosis not present

## 2014-11-08 DIAGNOSIS — L97919 Non-pressure chronic ulcer of unspecified part of right lower leg with unspecified severity: Secondary | ICD-10-CM | POA: Diagnosis not present

## 2014-11-08 DIAGNOSIS — I10 Essential (primary) hypertension: Secondary | ICD-10-CM | POA: Diagnosis not present

## 2014-11-08 DIAGNOSIS — R635 Abnormal weight gain: Secondary | ICD-10-CM | POA: Diagnosis not present

## 2014-11-08 DIAGNOSIS — L97929 Non-pressure chronic ulcer of unspecified part of left lower leg with unspecified severity: Secondary | ICD-10-CM

## 2014-11-08 DIAGNOSIS — M069 Rheumatoid arthritis, unspecified: Secondary | ICD-10-CM

## 2014-11-08 LAB — CBC WITH DIFFERENTIAL/PLATELET
BASOS PCT: 0 % (ref 0–1)
Basophils Absolute: 0 10*3/uL (ref 0.0–0.1)
EOS ABS: 0.2 10*3/uL (ref 0.0–0.7)
EOS PCT: 3 % (ref 0–5)
HCT: 37.6 % (ref 36.0–46.0)
Hemoglobin: 12.3 g/dL (ref 12.0–15.0)
LYMPHS ABS: 1.2 10*3/uL (ref 0.7–4.0)
Lymphocytes Relative: 17 % (ref 12–46)
MCH: 23.6 pg — AB (ref 26.0–34.0)
MCHC: 32.7 g/dL (ref 30.0–36.0)
MCV: 72.2 fL — AB (ref 78.0–100.0)
MONO ABS: 0.6 10*3/uL (ref 0.1–1.0)
MONOS PCT: 9 % (ref 3–12)
NEUTROS PCT: 71 % (ref 43–77)
Neutro Abs: 4.8 10*3/uL (ref 1.7–7.7)
PLATELETS: 226 10*3/uL (ref 150–400)
RBC: 5.21 MIL/uL — ABNORMAL HIGH (ref 3.87–5.11)
RDW: 20.1 % — AB (ref 11.5–15.5)
WBC: 6.8 10*3/uL (ref 4.0–10.5)

## 2014-11-08 LAB — COMPLETE METABOLIC PANEL WITH GFR
ALT: 9 U/L (ref 6–29)
AST: 15 U/L (ref 10–35)
Albumin: 3.7 g/dL (ref 3.6–5.1)
Alkaline Phosphatase: 51 U/L (ref 33–130)
BILIRUBIN TOTAL: 0.5 mg/dL (ref 0.2–1.2)
BUN: 15 mg/dL (ref 7–25)
CHLORIDE: 102 mmol/L (ref 98–110)
CO2: 26 mmol/L (ref 20–31)
Calcium: 9.1 mg/dL (ref 8.6–10.4)
Creat: 1.17 mg/dL — ABNORMAL HIGH (ref 0.50–1.05)
GFR, EST AFRICAN AMERICAN: 61 mL/min (ref >=60)
GFR, EST NON AFRICAN AMERICAN: 53 mL/min — AB (ref >=60)
GLUCOSE: 77 mg/dL (ref 65–99)
POTASSIUM: 4.1 mmol/L (ref 3.5–5.3)
SODIUM: 139 mmol/L (ref 135–146)
TOTAL PROTEIN: 6.6 g/dL (ref 6.1–8.1)

## 2014-11-08 LAB — POCT URINALYSIS DIP (DEVICE)
Bilirubin Urine: NEGATIVE
Glucose, UA: NEGATIVE mg/dL
Ketones, ur: NEGATIVE mg/dL
Leukocytes, UA: NEGATIVE
NITRITE: NEGATIVE
PH: 6.5 (ref 5.0–8.0)
PROTEIN: 100 mg/dL — AB
Specific Gravity, Urine: >=1.03 (ref 1.005–1.030)
Urobilinogen, UA: 0.2 mg/dL (ref 0.0–1.0)

## 2014-11-08 LAB — GLUCOSE, CAPILLARY: GLUCOSE-CAPILLARY: 102 mg/dL — AB (ref 65–99)

## 2014-11-08 LAB — TSH: TSH: 0.74 u[IU]/mL (ref 0.350–4.500)

## 2014-11-08 LAB — HEMOGLOBIN A1C
Hgb A1c MFr Bld: 6.1 % — ABNORMAL HIGH (ref <5.7)
MEAN PLASMA GLUCOSE: 128 mg/dL — AB (ref <117)

## 2014-11-08 MED ORDER — LISINOPRIL 20 MG PO TABS
20.0000 mg | ORAL_TABLET | Freq: Every day | ORAL | Status: DC
Start: 2014-11-08 — End: 2015-06-12

## 2014-11-08 MED ORDER — OXYCODONE HCL 5 MG PO TABS
5.0000 mg | ORAL_TABLET | ORAL | Status: DC | PRN
Start: 2014-11-08 — End: 2014-12-11

## 2014-11-08 MED ORDER — HYDROCHLOROTHIAZIDE 25 MG PO TABS: 25.0000 mg | ORAL_TABLET | Freq: Every day | ORAL | Status: DC

## 2014-11-08 NOTE — Progress Notes (Signed)
Subjective:    Patient ID: [name], female    DOB: 1960-06-30, 54 y.o.   MRN: [id number]  Hypertension Pertinent negatives include no headaches.   [name] is a 54 y.o. female with a history of hypertension, bilateral stasis ulcers, myasthenia gravis, rheumatoid arthritis, and anemia presents for a 3 month follow up of hypertension. .  She states that she was taking medications consistently prior to running out 4 days ago. [name] does not follow a low sodium diet or exercise.  Patient denies dizziness chest pain, dyspnea, fatigue, orthopnea, palpitations, syncope and tachypnea.  Cardiovascular risk factors include: hypertension and sedentary lifestyle.  Patient has leg ulcers to lower extremities bilaterally. She has chronic pain associated with leg ulcers. She takes Oxycodone 5 mg prior to daily dressing changes. She states that she has not been for follow up at [facility] due to transportation constraints. She states that she is in the process of gaining access to transportation. She keeps wound clean and dry. She reports that current pain intensity is 5/10, she last had Oxycodone on yesterday prior to dressing changes with moderate relief. Leg ulcers have minimal drainage.   She was previously followed by Dr. Lenna Gilford rheumatologist, for rheumatoid arthritis. Her last appointment was on 06/27/2014. She states that she has not followed up since Dr. Lenna Gilford relocated. She states that she is doing well on current medication regimen. She states that she was recently started on Prednisone 20 mg and Plaquenil.Patient reports periodic stiffness and tenderness primarily to hands of mild severity.  Symptoms are made worse by: cold exposure.  Symptoms are helped by current treatment regimen.    Patient denies headache, abdominal pain, cough, shortness of breath, and no new numbness, weakness, or tingling.   Immunization History  Administered Date(s) Administered  .  Influenza,inj,Quad PF,36+ Mos 09/27/2013  . Tdap 09/27/2013   Past Medical History  Diagnosis Date  . Asthma   . Hypertension   . CHF (congestive heart failure)   . Myasthenia gravis   . Heart murmur   . Venous stasis     bilateral  . DVT (deep venous thrombosis) 2000, 2004    Not on anticoagulation   . Fibroids   . CRF (chronic renal failure)   . Protein calorie malnutrition   . Hypokalemia   . Rheumatoid arthritis   . Metabolic acidosis, increased anion gap (IAG)   . Iron deficiency anemia   . Sepsis   . Glaucoma    Social History   Social History  . Marital Status: Single    Spouse Name: N/A  . Number of Children: 0  . Years of Education: college   Occupational History  .      Disabled   Social History Main Topics  . Smoking status: Never Smoker   . Smokeless tobacco: Never Used  . Alcohol Use: No  . Drug Use: No  . Sexual Activity: No   Other Topics Concern  . Not on file   Social History Narrative   Patient lives at home alone.   Disabled.   Education college.   Right handed.   Caffeine patient drinks coffee.   Review of Systems  Constitutional: Negative.  Negative for fatigue.  HENT: Negative.   Eyes: Negative.  Negative for visual disturbance.  Respiratory: Positive for apnea.   Cardiovascular: Negative.   Gastrointestinal: Negative.   Endocrine: Negative.  Negative for cold intolerance, heat intolerance, polydipsia, polyphagia and polyuria.  Genitourinary: Negative.  Negative  for dysuria and urgency.  Musculoskeletal: Positive for myalgias (chronic lower extremity pain).  Skin: Positive for wound.       Lower extremity ulcers  Allergic/Immunologic: Negative.  Negative for immunocompromised state.  Neurological: Negative for dizziness, weakness, light-headedness and headaches.  Hematological: Negative.   Psychiatric/Behavioral: Negative.  Negative for suicidal ideas and sleep disturbance.       Objective:   Physical Exam   Constitutional: She is oriented to person, place, and time. She appears well-developed and well-nourished.  HENT:  Head: Normocephalic and atraumatic.  Right Ear: External ear normal.  Left Ear: External ear normal.  Nose: Nose normal.  Mouth/Throat: Oropharynx is clear and moist.  Eyes: Conjunctivae and EOM are normal. Pupils are equal, round, and reactive to light.  Neck: Normal range of motion. Neck supple.  Cardiovascular: Normal rate, regular rhythm, normal heart sounds and intact distal pulses.   Pulmonary/Chest: Effort normal and breath sounds normal.  Abdominal: Soft. Bowel sounds are normal.  Musculoskeletal: Normal range of motion.  Neurological: She is alert and oriented to person, place, and time. She has normal reflexes.  Skin: Skin is warm and dry.  Bilateral lower extremity ulcers. Legs are wrapped extensively, unable to assess. Patient was adamant about not removing dressings.   alopecia  Psychiatric: She has a normal mood and affect. Her behavior is normal. Judgment and thought content normal.         BP 153/77 mmHg  Pulse 112  Temp(Src) 98.2 F (36.8 C) (Oral)  Resp 22  Ht 5\' 7"  (1.702 m)  Wt 234 lb (106.142 kg)  BMI 36.64 kg/m2  SpO2 100%  LMP 07/12/2011 Assessment & Plan:   1. Essential hypertension Blood pressure is above goal on current medication regimen. She reports that she has been out of medications for the past several days.  - Urinalysis Dipstick - COMPLETE METABOLIC PANEL WITH GFR - lisinopril (PRINIVIL,ZESTRIL) 20 MG tablet; Take 1 tablet (20 mg total) by mouth daily.  Dispense: 90 tablet; Refill: 1 - hydrochlorothiazide (HYDRODIURIL) 25 MG tablet; Take 1 tablet (25 mg total) by mouth daily.  Dispense: 90 tablet; Refill: 1 - POCT urinalysis dip (device)  2. Ulcers of both lower legs ([place]) Patient is no longer Patient continues to have pain associated with chronic leg wounds. She has not been to the wound center at [facility] over the past  month due to transportation constraints. 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 Patient continues to have 7/10 pain to lower extremities. Patient continues to change dressing BID.    - oxyCODONE (OXY IR/ROXICODONE) 5 MG immediate release tablet; Take 1 tablet (5 mg total) by mouth every 4 (four) hours as needed for severe pain.  Dispense: 120 tablet; Refill: 0  3. Chronic pain Patient was referred to pain clinic, she did not follow up due to financial constraints.  - oxyCODONE (OXY IR/ROXICODONE) 5 MG immediate release tablet; Take 1 tablet (5 mg total) by mouth every 4 (four) hours as needed for severe pain.  Dispense: 120 tablet; Refill: 0 - Ambulatory referral to Pain Clinic  4. Pain of lower extremity, unspecified laterality Patient was previously under the care of [place] weekly. She states that she has not been able to continue care. She states that she continues to change dressings daily. She states that she has increased pain with dressing changes.  We agreed on current medication and plan on titrating her medication dose. We discussed that pt is to receive her  Schedule II prescriptions only from [name]. Pt is also aware that the prescription history is available to [name] online through the [facility] CSRS. We reminded [name] that all patients receiving Schedule II narcotics must be seen for follow within one month of prescription being requested. Discussed the need to keep medicines in a safe locked location away from children or pets, and the need to report excess sedation or constipation, measures to avoid constipation, and policies related to early refills and stolen prescriptions. According to the [place] [facility] program, we have reviewed details related to analgesia, adverse effects, aberrant behaviors.  - oxyCODONE (OXY IR/ROXICODONE) 5 MG immediate release tablet; Take 1 tablet (5 mg total) by mouth every 4 (four) hours as needed for severe  pain.  Dispense: 120 tablet; Refill: 0  5. Anemia of chronic disease Reviewed - CBC with Differential  6. Weight gain Recommend a lowfat, low carbohydrate diet divided over 5-6 small meals and  increase water intake to 6-8 glasses - TSH  7. Hyperglycemia - Hemoglobin A1c - Glucose (CBG) - Glucose, capillary  8. Rheumatoid arthritis, involving unspecified site, unspecified rheumatoid factor presence ([place])  Patient is under the care of Dr. Lenna Gilford, rheumatologist previously. She states that Dr. Lenna Gilford has left and she did not return for follow-up. Patient will need another rheumatologist. Patient is currently on Plaquenil and will need follow-up  [facility] for a follow up appointment for [name], she is scheduled on February 17, 2015 at 1 pm with Dr. Sabino Niemann, rheumatologist.      RTC: 1 Month for hypertension and chronic pain.    Dorena Dew, FNP

## 2014-11-10 ENCOUNTER — Telehealth: Payer: Self-pay | Admitting: Family Medicine

## 2014-11-10 ENCOUNTER — Encounter: Payer: Self-pay | Admitting: Family Medicine

## 2014-11-10 DIAGNOSIS — R739 Hyperglycemia, unspecified: Secondary | ICD-10-CM | POA: Insufficient documentation

## 2014-11-10 NOTE — Telephone Encounter (Signed)
Reviewed labs, hemoglobin a1C is 6.1. Recommend a lowfat, low carbohydrate diet divided over 5-6 small meals, increase water intake to 6-8 glasses, and increase daily activity. Patient to follow up as previously scheduled.    Dorena Dew, FNP

## 2014-11-12 ENCOUNTER — Telehealth: Payer: Self-pay

## 2014-11-12 NOTE — Telephone Encounter (Signed)
-----   Message from Dorena Dew, FNP sent at 11/12/2014  9:36 AM EDT ----- Please inform Ms. Schneck that she has a follow up appointment with rheumatologist, Dr. Sabino Niemann at [facility] on February 17, 2015 at 1 pm.   Thanks

## 2014-11-12 NOTE — Telephone Encounter (Signed)
Left message advising patient of upcomming rheumatology appointment. Thanks!

## 2014-12-06 ENCOUNTER — Ambulatory Visit: Payer: Medicare Other | Admitting: Family Medicine

## 2014-12-11 ENCOUNTER — Encounter: Payer: Self-pay | Admitting: Internal Medicine

## 2014-12-11 ENCOUNTER — Telehealth: Payer: Self-pay | Admitting: Family Medicine

## 2014-12-11 DIAGNOSIS — L97929 Non-pressure chronic ulcer of unspecified part of left lower leg with unspecified severity: Principal | ICD-10-CM

## 2014-12-11 DIAGNOSIS — G8929 Other chronic pain: Secondary | ICD-10-CM

## 2014-12-11 DIAGNOSIS — L97919 Non-pressure chronic ulcer of unspecified part of right lower leg with unspecified severity: Secondary | ICD-10-CM

## 2014-12-11 DIAGNOSIS — M79606 Pain in leg, unspecified: Secondary | ICD-10-CM

## 2014-12-11 MED ORDER — OXYCODONE HCL 5 MG PO TABS: 5.0000 mg | ORAL_TABLET | ORAL | Status: DC | PRN

## 2014-12-11 NOTE — Telephone Encounter (Signed)
Refill request for oxycodone 5mg . LOV 11/08/2014. Patient has appointment with pain management for 12/27/2014. Please advise. Thanks!

## 2014-12-11 NOTE — Telephone Encounter (Signed)
Patient has an appointment scheduled with pain management on 12/27/2014. Will not longer treat chronic pain. Reviewed [place] Substance Reporting system prior to reorder, no inconsistencies noted.    Meds ordered this encounter  Medications  . oxyCODONE (OXY IR/ROXICODONE) 5 MG immediate release tablet    Sig: Take 1 tablet (5 mg total) by mouth every 4 (four) hours as needed for severe pain.    Dispense:  120 tablet    Refill:  0    Order Specific Question:  Supervising Provider    Answer:  Tresa Garter [id number]     Dorena Dew, FNP

## 2014-12-17 ENCOUNTER — Telehealth: Payer: Self-pay | Admitting: Family Medicine

## 2014-12-17 NOTE — Telephone Encounter (Signed)
Left message for patient regarding rescheduling of appointment for 12/18/14.

## 2014-12-18 ENCOUNTER — Ambulatory Visit: Payer: Medicare Other | Admitting: Family Medicine

## 2015-01-07 ENCOUNTER — Telehealth: Payer: Self-pay | Admitting: Family Medicine

## 2015-01-07 DIAGNOSIS — G8929 Other chronic pain: Secondary | ICD-10-CM

## 2015-01-07 DIAGNOSIS — M79606 Pain in leg, unspecified: Secondary | ICD-10-CM

## 2015-01-07 DIAGNOSIS — L97929 Non-pressure chronic ulcer of unspecified part of left lower leg with unspecified severity: Principal | ICD-10-CM

## 2015-01-07 DIAGNOSIS — L97919 Non-pressure chronic ulcer of unspecified part of right lower leg with unspecified severity: Secondary | ICD-10-CM

## 2015-01-07 NOTE — Telephone Encounter (Signed)
Refill request for oxycodone 5mg . LOV 11/08/2014. Please advise. Thanks!

## 2015-01-08 MED ORDER — OXYCODONE HCL 5 MG PO TABS: 5.0000 mg | ORAL_TABLET | ORAL | Status: AC | PRN

## 2015-01-08 NOTE — Telephone Encounter (Signed)
Reviewed [place] Substance Reporting system prior to prescribing opiate medications, no inconsistencies noted. Also, sent referral to pain management for further treatment of chronic pain.   Meds ordered this encounter  Medications  . oxyCODONE (OXY IR/ROXICODONE) 5 MG immediate release tablet    Sig: Take 1 tablet (5 mg total) by mouth every 4 (four) hours as needed for severe pain.    Dispense:  120 tablet    Refill:  0    Rx not to be filled prior to 01/10/2015    Order Specific Question:  Supervising Provider    Answer:  Tresa Garter [id number]    Dorena Dew, FNP

## 2015-01-09 ENCOUNTER — Encounter: Payer: Self-pay | Admitting: Family Medicine

## 2015-01-09 ENCOUNTER — Other Ambulatory Visit: Payer: Self-pay | Admitting: Neurology

## 2015-01-09 ENCOUNTER — Other Ambulatory Visit: Payer: Self-pay | Admitting: Family Medicine

## 2015-01-10 ENCOUNTER — Other Ambulatory Visit: Payer: Self-pay

## 2015-01-10 MED ORDER — PYRIDOSTIGMINE BROMIDE 60 MG PO TABS: 60.0000 mg | ORAL_TABLET | Freq: Four times a day (QID) | ORAL | Status: AC

## 2015-02-17 ENCOUNTER — Encounter: Payer: Self-pay | Admitting: Family Medicine

## 2015-02-17 ENCOUNTER — Ambulatory Visit: Payer: Medicare Other | Admitting: Family Medicine

## 2015-03-29 ENCOUNTER — Other Ambulatory Visit: Payer: Self-pay | Admitting: Internal Medicine

## 2015-04-07 DIAGNOSIS — L03116 Cellulitis of left lower limb: Secondary | ICD-10-CM | POA: Diagnosis not present

## 2015-04-07 DIAGNOSIS — I890 Lymphedema, not elsewhere classified: Secondary | ICD-10-CM | POA: Diagnosis not present

## 2015-04-07 DIAGNOSIS — I83029 Varicose veins of left lower extremity with ulcer of unspecified site: Secondary | ICD-10-CM | POA: Diagnosis not present

## 2015-04-07 DIAGNOSIS — I83019 Varicose veins of right lower extremity with ulcer of unspecified site: Secondary | ICD-10-CM | POA: Diagnosis not present

## 2015-05-01 DIAGNOSIS — L97912 Non-pressure chronic ulcer of unspecified part of right lower leg with fat layer exposed: Secondary | ICD-10-CM | POA: Diagnosis not present

## 2015-05-01 DIAGNOSIS — L97922 Non-pressure chronic ulcer of unspecified part of left lower leg with fat layer exposed: Secondary | ICD-10-CM | POA: Diagnosis not present

## 2015-05-01 DIAGNOSIS — I10 Essential (primary) hypertension: Secondary | ICD-10-CM | POA: Diagnosis not present

## 2015-06-02 DIAGNOSIS — L89893 Pressure ulcer of other site, stage 3: Secondary | ICD-10-CM | POA: Diagnosis not present

## 2015-06-02 DIAGNOSIS — I83028 Varicose veins of left lower extremity with ulcer other part of lower leg: Secondary | ICD-10-CM | POA: Diagnosis not present

## 2015-06-02 DIAGNOSIS — L089 Local infection of the skin and subcutaneous tissue, unspecified: Secondary | ICD-10-CM | POA: Diagnosis not present

## 2015-06-02 DIAGNOSIS — L97829 Non-pressure chronic ulcer of other part of left lower leg with unspecified severity: Secondary | ICD-10-CM | POA: Diagnosis not present

## 2015-06-02 DIAGNOSIS — T148 Other injury of unspecified body region: Secondary | ICD-10-CM | POA: Diagnosis not present

## 2015-06-02 DIAGNOSIS — I872 Venous insufficiency (chronic) (peripheral): Secondary | ICD-10-CM | POA: Diagnosis not present

## 2015-06-02 DIAGNOSIS — I83018 Varicose veins of right lower extremity with ulcer other part of lower leg: Secondary | ICD-10-CM | POA: Diagnosis not present

## 2015-06-02 DIAGNOSIS — I890 Lymphedema, not elsewhere classified: Secondary | ICD-10-CM | POA: Diagnosis not present

## 2015-06-02 DIAGNOSIS — L97819 Non-pressure chronic ulcer of other part of right lower leg with unspecified severity: Secondary | ICD-10-CM | POA: Diagnosis not present

## 2015-06-02 DIAGNOSIS — M069 Rheumatoid arthritis, unspecified: Secondary | ICD-10-CM | POA: Diagnosis not present

## 2015-06-10 ENCOUNTER — Emergency Department (HOSPITAL_COMMUNITY): Payer: Medicare Other

## 2015-06-10 ENCOUNTER — Inpatient Hospital Stay (HOSPITAL_COMMUNITY)
Admission: EM | Admit: 2015-06-10 | Discharge: 2015-06-12 | DRG: 602 | Disposition: A | Discharge status: Home Health | Payer: Medicare Other | Attending: Internal Medicine | Admitting: Internal Medicine

## 2015-06-10 ENCOUNTER — Encounter (HOSPITAL_COMMUNITY): Payer: Self-pay | Admitting: Emergency Medicine

## 2015-06-10 DIAGNOSIS — D638 Anemia in other chronic diseases classified elsewhere: Secondary | ICD-10-CM | POA: Diagnosis present

## 2015-06-10 DIAGNOSIS — I83019 Varicose veins of right lower extremity with ulcer of unspecified site: Secondary | ICD-10-CM | POA: Diagnosis present

## 2015-06-10 DIAGNOSIS — B965 Pseudomonas (aeruginosa) (mallei) (pseudomallei) as the cause of diseases classified elsewhere: Secondary | ICD-10-CM | POA: Diagnosis present

## 2015-06-10 DIAGNOSIS — G7000 Myasthenia gravis without (acute) exacerbation: Secondary | ICD-10-CM | POA: Diagnosis not present

## 2015-06-10 DIAGNOSIS — M86662 Other chronic osteomyelitis, left tibia and fibula: Secondary | ICD-10-CM | POA: Diagnosis present

## 2015-06-10 DIAGNOSIS — T148 Other injury of unspecified body region: Secondary | ICD-10-CM | POA: Diagnosis not present

## 2015-06-10 DIAGNOSIS — D509 Iron deficiency anemia, unspecified: Secondary | ICD-10-CM | POA: Diagnosis present

## 2015-06-10 DIAGNOSIS — L97919 Non-pressure chronic ulcer of unspecified part of right lower leg with unspecified severity: Secondary | ICD-10-CM | POA: Diagnosis present

## 2015-06-10 DIAGNOSIS — J45909 Unspecified asthma, uncomplicated: Secondary | ICD-10-CM | POA: Diagnosis present

## 2015-06-10 DIAGNOSIS — N189 Chronic kidney disease, unspecified: Secondary | ICD-10-CM | POA: Diagnosis present

## 2015-06-10 DIAGNOSIS — L03115 Cellulitis of right lower limb: Secondary | ICD-10-CM | POA: Diagnosis not present

## 2015-06-10 DIAGNOSIS — I83009 Varicose veins of unspecified lower extremity with ulcer of unspecified site: Secondary | ICD-10-CM | POA: Diagnosis not present

## 2015-06-10 DIAGNOSIS — M8660 Other chronic osteomyelitis, unspecified site: Secondary | ICD-10-CM | POA: Diagnosis not present

## 2015-06-10 DIAGNOSIS — Z8249 Family history of ischemic heart disease and other diseases of the circulatory system: Secondary | ICD-10-CM | POA: Diagnosis not present

## 2015-06-10 DIAGNOSIS — E871 Hypo-osmolality and hyponatremia: Secondary | ICD-10-CM | POA: Diagnosis not present

## 2015-06-10 DIAGNOSIS — I878 Other specified disorders of veins: Secondary | ICD-10-CM | POA: Diagnosis present

## 2015-06-10 DIAGNOSIS — Z79891 Long term (current) use of opiate analgesic: Secondary | ICD-10-CM | POA: Diagnosis not present

## 2015-06-10 DIAGNOSIS — L89323 Pressure ulcer of left buttock, stage 3: Secondary | ICD-10-CM | POA: Diagnosis not present

## 2015-06-10 DIAGNOSIS — I83029 Varicose veins of left lower extremity with ulcer of unspecified site: Secondary | ICD-10-CM | POA: Diagnosis present

## 2015-06-10 DIAGNOSIS — H409 Unspecified glaucoma: Secondary | ICD-10-CM | POA: Diagnosis present

## 2015-06-10 DIAGNOSIS — L0390 Cellulitis, unspecified: Secondary | ICD-10-CM | POA: Diagnosis not present

## 2015-06-10 DIAGNOSIS — L089 Local infection of the skin and subcutaneous tissue, unspecified: Secondary | ICD-10-CM | POA: Diagnosis not present

## 2015-06-10 DIAGNOSIS — Z881 Allergy status to other antibiotic agents status: Secondary | ICD-10-CM | POA: Diagnosis not present

## 2015-06-10 DIAGNOSIS — Z79899 Other long term (current) drug therapy: Secondary | ICD-10-CM | POA: Diagnosis not present

## 2015-06-10 DIAGNOSIS — S81801A Unspecified open wound, right lower leg, initial encounter: Secondary | ICD-10-CM | POA: Diagnosis not present

## 2015-06-10 DIAGNOSIS — L97929 Non-pressure chronic ulcer of unspecified part of left lower leg with unspecified severity: Secondary | ICD-10-CM | POA: Diagnosis not present

## 2015-06-10 DIAGNOSIS — M069 Rheumatoid arthritis, unspecified: Secondary | ICD-10-CM | POA: Diagnosis present

## 2015-06-10 DIAGNOSIS — E860 Dehydration: Secondary | ICD-10-CM | POA: Diagnosis present

## 2015-06-10 DIAGNOSIS — A498 Other bacterial infections of unspecified site: Secondary | ICD-10-CM | POA: Diagnosis not present

## 2015-06-10 DIAGNOSIS — Z823 Family history of stroke: Secondary | ICD-10-CM | POA: Diagnosis not present

## 2015-06-10 DIAGNOSIS — I10 Essential (primary) hypertension: Secondary | ICD-10-CM | POA: Diagnosis present

## 2015-06-10 DIAGNOSIS — T148XXA Other injury of unspecified body region, initial encounter: Secondary | ICD-10-CM

## 2015-06-10 DIAGNOSIS — Z888 Allergy status to other drugs, medicaments and biological substances status: Secondary | ICD-10-CM

## 2015-06-10 DIAGNOSIS — Z885 Allergy status to narcotic agent status: Secondary | ICD-10-CM | POA: Diagnosis not present

## 2015-06-10 DIAGNOSIS — Z833 Family history of diabetes mellitus: Secondary | ICD-10-CM | POA: Diagnosis not present

## 2015-06-10 DIAGNOSIS — Z886 Allergy status to analgesic agent status: Secondary | ICD-10-CM | POA: Diagnosis not present

## 2015-06-10 DIAGNOSIS — L8990 Pressure ulcer of unspecified site, unspecified stage: Secondary | ICD-10-CM | POA: Insufficient documentation

## 2015-06-10 DIAGNOSIS — L97909 Non-pressure chronic ulcer of unspecified part of unspecified lower leg with unspecified severity: Secondary | ICD-10-CM

## 2015-06-10 LAB — CBC WITH DIFFERENTIAL/PLATELET
Basophils Absolute: 0 10*3/uL (ref 0.0–0.1)
Basophils Relative: 0 %
Eosinophils Absolute: 0.1 10*3/uL (ref 0.0–0.7)
Eosinophils Relative: 2 %
HEMATOCRIT: 30.7 % — AB (ref 36.0–46.0)
HEMOGLOBIN: 9.7 g/dL — AB (ref 12.0–15.0)
LYMPHS ABS: 1.2 10*3/uL (ref 0.7–4.0)
Lymphocytes Relative: 21 %
MCH: 23.2 pg — AB (ref 26.0–34.0)
MCHC: 31.6 g/dL (ref 30.0–36.0)
MCV: 73.4 fL — ABNORMAL LOW (ref 78.0–100.0)
MONO ABS: 0.6 10*3/uL (ref 0.1–1.0)
MONOS PCT: 10 %
NEUTROS ABS: 4 10*3/uL (ref 1.7–7.7)
NEUTROS PCT: 67 %
Platelets: 248 10*3/uL (ref 150–400)
RBC: 4.18 MIL/uL (ref 3.87–5.11)
RDW: 17.8 % — AB (ref 11.5–15.5)
WBC: 5.9 10*3/uL (ref 4.0–10.5)

## 2015-06-10 LAB — BASIC METABOLIC PANEL
ANION GAP: 8 (ref 5–15)
BUN: 23 mg/dL — ABNORMAL HIGH (ref 6–20)
CHLORIDE: 103 mmol/L (ref 101–111)
CO2: 25 mmol/L (ref 22–32)
CREATININE: 1.16 mg/dL — AB (ref 0.44–1.00)
Calcium: 8.7 mg/dL — ABNORMAL LOW (ref 8.9–10.3)
GFR calc non Af Amer: 52 mL/min — ABNORMAL LOW (ref >60)
GFR, EST AFRICAN AMERICAN: 60 mL/min — AB (ref >60)
GLUCOSE: 80 mg/dL (ref 65–99)
Potassium: 3.4 mmol/L — ABNORMAL LOW (ref 3.5–5.1)
Sodium: 136 mmol/L (ref 135–145)

## 2015-06-10 LAB — I-STAT CG4 LACTIC ACID, ED: LACTIC ACID, VENOUS: 1.12 mmol/L (ref 0.5–2.0)

## 2015-06-10 LAB — MRSA PCR SCREENING: MRSA BY PCR: NEGATIVE

## 2015-06-10 MED ORDER — POTASSIUM CHLORIDE CRYS ER 20 MEQ PO TBCR
40.0000 meq | EXTENDED_RELEASE_TABLET | Freq: Once | ORAL | Status: AC
  Administered 2015-06-10: 40 meq via ORAL
  Filled 2015-06-10: qty 2

## 2015-06-10 MED ORDER — ONDANSETRON HCL 4 MG PO TABS: 4.0000 mg | ORAL_TABLET | Freq: Four times a day (QID) | ORAL | Status: DC | PRN

## 2015-06-10 MED ORDER — ONDANSETRON HCL 4 MG/2ML IJ SOLN: 4.0000 mg | Freq: Four times a day (QID) | INTRAMUSCULAR | Status: DC | PRN

## 2015-06-10 MED ORDER — OXYCODONE HCL 5 MG PO TABS
5.0000 mg | ORAL_TABLET | ORAL | Status: DC | PRN
  Administered 2015-06-10: 10 mg via ORAL
  Administered 2015-06-10: 5 mg via ORAL
  Administered 2015-06-11 (×3): 10 mg via ORAL
  Administered 2015-06-11: 5 mg via ORAL
  Administered 2015-06-11 – 2015-06-12 (×4): 10 mg via ORAL
  Filled 2015-06-10 (×2): qty 2
  Filled 2015-06-10: qty 1
  Filled 2015-06-10 (×4): qty 2
  Filled 2015-06-10: qty 1
  Filled 2015-06-10 (×2): qty 2

## 2015-06-10 MED ORDER — PYRIDOSTIGMINE BROMIDE 60 MG PO TABS
60.0000 mg | ORAL_TABLET | Freq: Four times a day (QID) | ORAL | Status: DC
  Administered 2015-06-10 – 2015-06-12 (×9): 60 mg via ORAL
  Filled 2015-06-10 (×12): qty 1

## 2015-06-10 MED ORDER — OXYCODONE HCL 5 MG PO TABS
5.0000 mg | ORAL_TABLET | ORAL | Status: DC | PRN
  Administered 2015-06-10: 5 mg via ORAL
  Filled 2015-06-10: qty 1

## 2015-06-10 MED ORDER — SODIUM CHLORIDE 0.9 % IV SOLN
INTRAVENOUS | Status: DC
  Administered 2015-06-10 – 2015-06-11 (×2): via INTRAVENOUS

## 2015-06-10 MED ORDER — PIPERACILLIN-TAZOBACTAM 3.375 G IVPB 30 MIN
3.3750 g | Freq: Three times a day (TID) | INTRAVENOUS | Status: DC
  Administered 2015-06-10 – 2015-06-11 (×3): 3.375 g via INTRAVENOUS
  Filled 2015-06-10 (×4): qty 50

## 2015-06-10 MED ORDER — LISINOPRIL 10 MG PO TABS
10.0000 mg | ORAL_TABLET | Freq: Every day | ORAL | Status: DC
  Administered 2015-06-11 – 2015-06-12 (×2): 10 mg via ORAL
  Filled 2015-06-10 (×2): qty 1

## 2015-06-10 MED ORDER — ENOXAPARIN SODIUM 40 MG/0.4ML ~~LOC~~ SOLN
40.0000 mg | SUBCUTANEOUS | Status: DC
  Administered 2015-06-10 – 2015-06-11 (×2): 40 mg via SUBCUTANEOUS
  Filled 2015-06-10 (×3): qty 0.4

## 2015-06-10 MED ORDER — MORPHINE SULFATE (PF) 2 MG/ML IV SOLN
2.0000 mg | INTRAVENOUS | Status: DC | PRN
  Administered 2015-06-11: 2 mg via INTRAVENOUS
  Filled 2015-06-10 (×2): qty 1

## 2015-06-10 MED ORDER — PIPERACILLIN-TAZOBACTAM 3.375 G IVPB
3.3750 g | Freq: Once | INTRAVENOUS | Status: AC
  Administered 2015-06-10: 3.375 g via INTRAVENOUS
  Filled 2015-06-10: qty 50

## 2015-06-10 MED ORDER — OXYCODONE HCL 5 MG PO TABS
10.0000 mg | ORAL_TABLET | Freq: Once | ORAL | Status: AC
  Administered 2015-06-10: 10 mg via ORAL
  Filled 2015-06-10: qty 2

## 2015-06-10 MED ORDER — PYRIDOSTIGMINE BROMIDE 60 MG PO TABS
60.0000 mg | ORAL_TABLET | Freq: Once | ORAL | Status: AC
  Administered 2015-06-10: 60 mg via ORAL
  Filled 2015-06-10: qty 1

## 2015-06-10 NOTE — ED Notes (Signed)
Patient here with complaints of bilateral wounds stage 3 and 4. Seen by Angelina Sheriff, FNP at [facility]. Reports that her wounds were cultured on Wednesday and they called her on Friday stating they were positive for Pseudomonas.

## 2015-06-10 NOTE — Progress Notes (Signed)
UR completed 

## 2015-06-10 NOTE — ED Notes (Signed)
Pt stated unable to give urine sample at this time 

## 2015-06-10 NOTE — ED Provider Notes (Signed)
CSN: [id number]     Arrival date & time 06/10/15  1016 History   First MD Initiated Contact with Patient 06/10/15 1032     Chief Complaint  Patient presents with  . Wound Infection     (Consider location/radiation/quality/duration/timing/severity/associated sxs/prior Treatment) HPI Comments: 55yo F w/ PMH including CHF, myasthenia gravis, rheumatoid arthritis, CKD who presents with leg wound infection. The patient has a long-standing history of bilateral lower leg wounds for which she has followed with wound care. She was recently evaluated and called back because wound cultures were positive for pseudomonas. Because of her antibiotic allergies, she was told to report to the ED for IV antibiotics. She has copious drainage chronically from her wounds require living daily. His changes. She denies any change in her chronic leg pain. No fevers or chills, no vomiting. She has been taking doxycycline which was prescribed to her by her physician.  The history is provided by the patient.    Past Medical History  Diagnosis Date  . Asthma   . Hypertension   . CHF (congestive heart failure) ([place])   . Myasthenia gravis ([place])   . Heart murmur   . Venous stasis     bilateral  . DVT (deep venous thrombosis) ([place]) 2000, 2004    Not on anticoagulation   . Fibroids   . CRF (chronic renal failure)   . Protein calorie malnutrition ([place])   . Hypokalemia   . Rheumatoid arthritis ([place])   . Metabolic acidosis, increased anion gap (IAG)   . Iron deficiency anemia   . Sepsis ([place])   . Glaucoma    Past Surgical History  Procedure Laterality Date  . Tonsillectomy and adenoidectomy  1964  . Myomectomy  11/1998  . Failed bilateral leg grafts    . Bronchoscopy     Family History  Problem Relation Age of Onset  . Depression Mother   . Breast cancer Mother   . CVA Father   . Atrial fibrillation Mother   . Stroke Mother   . Diabetes Father   . Diabetes Paternal Aunt   . Heart disease Mother   .  Heart disease Paternal Grandfather   . Heart disease Paternal Grandmother   . Heart disease Maternal Uncle     x 2   Social History  Substance Use Topics  . Smoking status: Never Smoker   . Smokeless tobacco: Never Used  . Alcohol Use: No   OB History    No data available     Review of Systems 10 Systems reviewed and are negative for acute change except as noted in the HPI.    Allergies  Asa; Cat hair extract; Dilaudid; Ibuprofen; Tylenol; Ciprofloxacin; Dicloxacillin; Erythromycin; Hydromorphone; Indomethacin; Naproxen; Nsaids; Tramadol; Alginate-collagen; Gold-containing drug products; and Silvadene  Home Medications   Prior to Admission medications   Medication Sig Start Date End Date Taking? Authorizing Provider  albuterol (PROVENTIL HFA;VENTOLIN HFA) 108 (90 BASE) MCG/ACT inhaler Inhale 4 puffs into the lungs every 6 (six) hours as needed. For shortness of breath    Historical Provider, MD  COD LIVER OIL PO Take 2 capsules by mouth daily.    Historical Provider, MD  diphenhydrAMINE (BENADRYL) 25 MG tablet Take 1 tablet (25 mg total) by mouth every 6 (six) hours as needed. Patient taking differently: Take 25 mg by mouth every 6 (six) hours as needed for itching or allergies.  09/09/13   Nishant Dhungel, MD  Emollient (NIVEA) cream Apply 1 application topically daily. Apply all  over body    Historical Provider, MD  EPINEPHrine (EPIPEN) 0.3 mg/0.3 mL DEVI Inject 0.3 mg into the muscle as needed. For allergic reaction    Historical Provider, MD  ferrous sulfate 325 (65 FE) MG tablet Take 325 mg by mouth daily with breakfast.     Historical Provider, MD  folic acid (FOLVITE) A999333 MCG tablet Take 400 mcg by mouth daily.    Historical Provider, MD  Garlic Oil 123XX123 MG CAPS Take 1,000 mg by mouth daily.    Historical Provider, MD  hydrochlorothiazide (HYDRODIURIL) 25 MG tablet Take 1 tablet (25 mg total) by mouth daily. 11/08/14   Dorena Dew, FNP  hydrocortisone cream 1 % Apply  topically 2 (two) times daily. 12/24/13   Reyne Dumas, MD  hydrOXYzine (ATARAX/VISTARIL) 25 MG tablet Take 1 tablet (25 mg total) by mouth every 4 (four) hours as needed for itching. Patient not taking: Reported on 11/08/2014 12/24/13   Reyne Dumas, MD  lidocaine (XYLOCAINE) 4 % external solution Apply 5 mLs topically 4 (four) times daily. 10/15/13   Leana Gamer, MD  lisinopril (PRINIVIL,ZESTRIL) 10 MG tablet TAKE ONE TABLET BY MOUTH ONCE DAILY 03/31/15   Dorena Dew, FNP  lisinopril (PRINIVIL,ZESTRIL) 20 MG tablet Take 1 tablet (20 mg total) by mouth daily. 11/08/14   Dorena Dew, FNP  Omega-3 Fatty Acids (FISH OIL) 500 MG CAPS Take 1,000 mg by mouth daily.    Historical Provider, MD  oxyCODONE (OXY IR/ROXICODONE) 5 MG immediate release tablet Take 1 tablet (5 mg total) by mouth every 4 (four) hours as needed for severe pain. 01/08/15   Dorena Dew, FNP  pyridostigmine (MESTINON) 60 MG tablet Take 1 tablet (60 mg total) by mouth 4 (four) times daily. 01/10/15   Marcial Pacas, MD   BP 143/73 mmHg  Pulse 110  Temp(Src) 98.1 F (36.7 C) (Oral)  Resp 20  SpO2 95%  LMP 07/12/2011 Physical Exam  Constitutional: She is oriented to person, place, and time. She appears well-developed and well-nourished. No distress.  Chronically ill appearing  HENT:  Head: Normocephalic and atraumatic.  Moist mucous membranes  Eyes: Conjunctivae are normal. Pupils are equal, round, and reactive to light.  Neck: Neck supple.  Cardiovascular: Normal rate and regular rhythm.   Murmur heard.  Systolic murmur is present with a grade of 2/6  Pulmonary/Chest: Effort normal and breath sounds normal.  Abdominal: Soft. Bowel sounds are normal. She exhibits no distension. There is no tenderness.  Musculoskeletal: She exhibits edema and tenderness.  Extensive chronic wounds on b/l lower legs from knees to ankles w/ copious foul-smelling drainage  Neurological: She is alert and oriented to person, place,  and time.  Fluent speech  Skin:  Extensive b/l leg wounds (see photos)  Psychiatric: She has a normal mood and affect. Judgment normal.  Nursing note and vitals reviewed.       ED Course  Procedures (including critical care time) Labs Review Labs Reviewed  BASIC METABOLIC PANEL - Abnormal; Notable for the following:    Potassium 3.4 (*)    BUN 23 (*)    Creatinine, Ser 1.16 (*)    Calcium 8.7 (*)    GFR calc non Af Amer 52 (*)    GFR calc Af Amer 60 (*)    All other components within normal limits  CBC WITH DIFFERENTIAL/PLATELET - Abnormal; Notable for the following:    Hemoglobin 9.7 (*)    HCT 30.7 (*)    MCV 73.4 (*)  MCH 23.2 (*)    RDW 17.8 (*)    All other components within normal limits  CULTURE, BLOOD (ROUTINE X 2)  CULTURE, BLOOD (ROUTINE X 2)  URINE CULTURE  URINALYSIS, ROUTINE W REFLEX MICROSCOPIC (NOT AT [facility])  I-STAT CG4 LACTIC ACID, ED    Imaging Review Dg Tibia/fibula Left  06/10/2015  CLINICAL DATA:  Chronic large bore open wounds on both tibia and fibula. EXAM: LEFT TIBIA AND FIBULA - 2 VIEW COMPARISON:  12/20/2013 FINDINGS: There is a diffuse soft tissue edema overlying the proximal tibia and fibula and the left ankle. Large areas of soft tissue ulceration overlie both the distal fibula and tibia. The fibula appears osteopenic and there is diffuse acute appearing periosteal reaction compatible with osteomyelitis. Along the lateral aspect of the tibia there is also evidence of acute periosteal reaction and elevation medially and distally. Chronic appearing periosteal reaction and heterotopic bone formation overlies the medial aspect of the distal tibia. There is no fracture identified at this time. IMPRESSION: 1. Large areas of soft tissue ulceration overlying the distal tibia and fibula. 2. 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 Electronically Signed   By: Kerby Moors M.D.   On: 06/10/2015 12:04   Dg Tibia/fibula  Right  06/10/2015  CLINICAL DATA:  Soft tissue wounds EXAM: RIGHT TIBIA AND FIBULA - 2 VIEW COMPARISON:  December 20, 2013 FINDINGS: Frontal lateral views were obtained. There is extensive soft tissue irregularity consistent with the extensive soft tissue wounds noted clinically. No well-defined soft tissue abscess evident. There is no acute fracture or dislocation. There is benign periosteal reaction along much of the fibula, a finding also present previously but slightly progressed, likely due to the chronic trophic changes from the soft tissue wounds. There is chronic fusion of the knee joint, a stable finding. No bony destruction evident. IMPRESSION: Extensive soft tissue abnormality consistent with the wounds described clinically. No well-defined soft tissue abscess by radiography. Benign periosteal thickening along the fibula, progressed from prior study. This finding is likely secondary to the soft tissue abnormalities. No erosive change or bony destruction. No fracture or dislocation. Chronic fusion of the knee joint. Electronically Signed   By: Lowella Grip III M.D.   On: 06/10/2015 12:00   I have personally reviewed and evaluated these lab results as part of my medical decision-making.   EKG Interpretation None     Medications  piperacillin-tazobactam (ZOSYN) IVPB 3.375 g (not administered)  pyridostigmine (MESTINON) tablet 60 mg (not administered)  oxyCODONE (Oxy IR/ROXICODONE) immediate release tablet 10 mg (not administered)    MDM   Final diagnoses:  Wound infection (HCC)  Infection, Pseudomonas   Patient with extensive history including chronic leg wounds for which she follows at a wound clinic presents for IV antibiotics after a recent wound culture grew Pseudomonas. Patient was awake and alert, comfortable at presentation. She was afebrile, BP 143/73, heart rate 110. She had extensive wounds on her bilateral lower legs with copious foul-smelling drainage. Obtained above lab  work including blood cultures. After discussion with pharmacy, gave the patient Zosyn for pseudomonas coverage. Obtained plain films to evaluate for osteomyelitis.  Lactate was normal, normal WBC count, creatinine 1.16 and hemoglobin 9.7 which is consistent with the patient's baseline labs. Plain films do show evidence of chronic osteomyelitis involving left tibia, right films negative for osseous changes. Gave the patient a dose of Zosyn after conversation with pharmacy regarding her antibiotic allergies and known pseudomonal infection. Discussed with hospitalist,  Dr. Coralyn Pear, and patient admitted for further treatment.  Sharlett Iles, MD 06/10/15 1332

## 2015-06-10 NOTE — Progress Notes (Signed)
Pt appreciative of resources offered Reports cost of her treatment on her legs is generally $500/Month

## 2015-06-10 NOTE — Progress Notes (Signed)
ED CM noted CM consult for CONCERNED THAT PATIENT DOES NOT HAVE MONEY FOR FOOD AT [facility] ED CM review EPIC chart review to note pt corresponded with pcp office via email or text during 12/2014 because of no phone related to concerns with food, money and wound supplies Refer to [id number] notes ion EPIC chart review CM offered pt written information on [employer] resources of [place], [place] transportation for [employer] and disabled, plus a 16 pag copy of the [employer] for [employer] to [employer] and meal locations/resources Also encouraged pt to speak with wound care center on samples or alternative discount supplies Pt is familiar with [facility] and [facility]

## 2015-06-10 NOTE — H&P (Signed)
History and Physical    [name] [id number] DOB: 1960-09-30 DOA: 06/10/2015  PCP: Helane Rima, MD  Patient coming from: Home  Chief Complaint: Lower extremity wound discharge and pain  HPI: [name] is a 55 y.o. female with medical history significant of bilateral lower extremity chronic venous stasis ulcers since 2006, receiving her care at the wound center, also having history of myasthenia gravis, hypertension, congestive heart failure, rheumatoid arthritis, referred into the emergency department today for possible infected wounds. She states that over the past month she has noted increased pain, erythema, swelling, discharge coming from her multiple lower extremity wounds. She had a wound culture done at the wound clinic at [facility] that grew pseudomonas aeruginosa for which she was referred to the emergency department. She denies fevers, chills, nausea, vomiting, mental status changes, worsening weakness, diarrhea, bloody stools. She was recently prescribed doxycycline at the wound care center.  ED Course: In the emergency department she was found to have foul-smelling lower extremity wounds, that appear to have superimposed infection, was given dose of IV vancomycin. Otherwise lab work revealed a white count of 5900 with a lactic acid of 1.12.  Review of Systems: As per HPI otherwise 10 point review of systems negative.    Past Medical History  Diagnosis Date  . Asthma   . Hypertension   . CHF (congestive heart failure) ([place])   . Myasthenia gravis ([place])   . Heart murmur   . Venous stasis     bilateral  . DVT (deep venous thrombosis) ([place]) 2000, 2004    Not on anticoagulation   . Fibroids   . CRF (chronic renal failure)   . Protein calorie malnutrition ([place])   . Hypokalemia   . Rheumatoid arthritis ([place])   . Metabolic acidosis, increased anion gap (IAG)   . Iron deficiency anemia   . Sepsis ([place])   . Glaucoma     Past Surgical History  Procedure  Laterality Date  . Tonsillectomy and adenoidectomy  1964  . Myomectomy  11/1998  . Failed bilateral leg grafts    . Bronchoscopy       reports that she has never smoked. She has never used smokeless tobacco. She reports that she does not drink alcohol or use illicit drugs.  Allergies  Allergen Reactions  . Asa [Aspirin] Shortness Of Breath, Rash and Anaphylaxis  . Cat Hair Extract Anaphylaxis  . Ciprofloxacin Shortness Of Breath    Has tolerated since first reaction.  . Dilaudid [Hydromorphone Hcl] Shortness Of Breath and Palpitations  . Ibuprofen Anaphylaxis  . Tylenol [Acetaminophen] Rash, Other (See Comments) and Anaphylaxis    Other reaction(s): SHORTNESS OF BREATH Salivation and hearing loss  . Erythromycin Other (See Comments)    Burn veins  . Indomethacin Other (See Comments)    Other reaction(s): OTHER Stroke symptoms  . Naproxen Other (See Comments)    Other reaction(s): OTHER Renal failure  . Nsaids Other (See Comments)    Kidney failure   . Tramadol     "stroke"  . Alginate-Collagen [Wound Dressings] Rash  . Gold-Containing Drug Products Rash  . Silvadene [Silver Sulfadiazine] Rash    Family History  Problem Relation Age of Onset  . Depression Mother   . Breast cancer Mother   . CVA Father   . Atrial fibrillation Mother   . Stroke Mother   . Diabetes Father   . Diabetes Paternal Aunt   . Heart disease Mother   . Heart disease Paternal Grandfather   .  Heart disease Paternal Grandmother   . Heart disease Maternal Uncle     x 2    Prior to Admission medications   Medication Sig Start Date End Date Taking? Authorizing Provider  albuterol (PROVENTIL HFA;VENTOLIN HFA) 108 (90 BASE) MCG/ACT inhaler Inhale 4 puffs into the lungs every 6 (six) hours as needed. For shortness of breath   Yes Historical Provider, MD  clotrimazole-betamethasone (LOTRISONE) cream Apply 1 application topically daily as needed (dressing change).   Yes Historical Provider, MD  COD  LIVER OIL PO Take 2 capsules by mouth daily.   Yes Historical Provider, MD  diphenhydrAMINE (BENADRYL) 25 MG tablet Take 1 tablet (25 mg total) by mouth every 6 (six) hours as needed. Patient taking differently: Take 50 mg by mouth every 8 (eight) hours as needed for itching or allergies.  09/09/13  Yes Nishant Dhungel, MD  doxycycline (VIBRAMYCIN) 100 MG capsule Take 100 mg by mouth 2 (two) times daily. Started 5/13. Continue for 5 days 06/05/15  Yes Historical Provider, MD  Emollient (NIVEA) cream Apply 1 application topically 2 (two) times daily as needed for dry skin. Apply all over body   Yes Historical Provider, MD  EPINEPHrine (EPIPEN) 0.3 mg/0.3 mL DEVI Inject 0.3 mg into the muscle as needed. For allergic reaction   Yes Historical Provider, MD  famotidine (PEPCID) 20 MG tablet Take 20 mg by mouth 2 (two) times daily as needed for heartburn or indigestion.   Yes Historical Provider, MD  ferrous sulfate 325 (65 FE) MG tablet Take 325 mg by mouth daily with breakfast.    Yes Historical Provider, MD  folic acid (FOLVITE) A999333 MCG tablet Take 400 mcg by mouth daily.   Yes Historical Provider, MD  Garlic Oil [id number] MG CAPS Take 1,000 mg by mouth daily.   Yes Historical Provider, MD  hydrochlorothiazide (HYDRODIURIL) 25 MG tablet Take 1 tablet (25 mg total) by mouth daily. 11/08/14  Yes Dorena Dew, FNP  hydrocortisone 2.5 % cream Apply 1 application topically every 6 (six) hours as needed (itching).   Yes Historical Provider, MD  hydrOXYzine (ATARAX/VISTARIL) 25 MG tablet Take 1 tablet (25 mg total) by mouth every 4 (four) hours as needed for itching. Patient taking differently: Take 25 mg by mouth every 6 (six) hours as needed for itching.  12/24/13  Yes Reyne Dumas, MD  lidocaine (XYLOCAINE) 4 % external solution Apply 5 mLs topically 4 (four) times daily. Patient taking differently: Apply 5 mLs topically daily as needed.  10/15/13  Yes Leana Gamer, MD  lisinopril (PRINIVIL,ZESTRIL) 10  MG tablet TAKE ONE TABLET BY MOUTH ONCE DAILY 03/31/15  Yes Dorena Dew, FNP  Multiple Vitamin (MULTIVITAMIN WITH MINERALS) TABS tablet Take 1 tablet by mouth daily.   Yes Historical Provider, MD  nystatin (NYSTATIN) powder Apply 1 g topically daily as needed (dressing change).   Yes Historical Provider, MD  oxyCODONE (OXY IR/ROXICODONE) 5 MG immediate release tablet Take 1 tablet (5 mg total) by mouth every 4 (four) hours as needed for severe pain. Patient taking differently: Take 5-10 mg by mouth every 6 (six) hours as needed for severe pain.  01/08/15  Yes Dorena Dew, FNP  pyridostigmine (MESTINON) 60 MG tablet Take 1 tablet (60 mg total) by mouth 4 (four) times daily. 01/10/15  Yes Marcial Pacas, MD  hydrocortisone cream 1 % Apply topically 2 (two) times daily. Patient not taking: Reported on 06/10/2015 12/24/13   Reyne Dumas, MD  lisinopril (PRINIVIL,ZESTRIL) 20 MG tablet  Take 1 tablet (20 mg total) by mouth daily. Patient not taking: Reported on 06/10/2015 11/08/14   Dorena Dew, FNP    Physical Exam: Filed Vitals:   06/10/15 1040 06/10/15 1305 06/10/15 1400  BP: 143/73 138/88   Pulse: 110 97   Temp: 98.1 F (36.7 C)    TempSrc: Oral    Resp: 20 16   Height:   5\' 7"  (1.702 m)  SpO2: 95% 95%     Constitutional: Nontoxic appearing awake and alert Filed Vitals:   06/10/15 1040 06/10/15 1305 06/10/15 1400  BP: 143/73 138/88   Pulse: 110 97   Temp: 98.1 F (36.7 C)    TempSrc: Oral    Resp: 20 16   Height:   5\' 7"  (1.702 m)  SpO2: 95% 95%    Eyes: PERRL, lids and conjunctivae normal ENMT: Mucous membranes are moist. Posterior pharynx clear of any exudate or lesions.Normal dentition.  Neck: normal, supple, no masses, no thyromegaly Respiratory: clear to auscultation bilaterally, no wheezing, no crackles. Normal respiratory effort. No accessory muscle use.  Cardiovascular: Regular rate and rhythm, no murmurs / rubs / gallops. No extremity edema. 2+ pedal pulses. No  carotid bruits.  Abdomen: no tenderness, no masses palpated. No hepatosplenomegaly. Bowel sounds positive.  Musculoskeletal: no clubbing / cyanosis. No joint deformity upper and lower extremities. Good ROM, no contractures. Normal muscle tone.  Skin: There are multiple venous stasis ulcers involving bilateral lower extremities appeared to be circumferential, associated with malodor. There is swelling and erythema present.Region appears painful to palpation. Neurologic: CN 2-12 grossly intact. Sensation intact, DTR normal. Strength 5/5 in all 4.  Psychiatric: Normal judgment and insight. Alert and oriented x 3. Normal mood.    Labs on Admission: I have personally reviewed following labs and imaging studies  CBC:  Recent Labs Lab 06/10/15 1104  WBC 5.9  NEUTROABS 4.0  HGB 9.7*  HCT 30.7*  MCV 73.4*  PLT [id number]   Basic Metabolic Panel:  Recent Labs Lab 06/10/15 1104  NA 136  K 3.4*  CL 103  CO2 25  GLUCOSE 80  BUN 23*  CREATININE 1.16*  CALCIUM 8.7*   GFR: CrCl cannot be calculated (Unknown ideal weight.). Liver Function Tests: No results for input(s): AST, ALT, ALKPHOS, BILITOT, PROT, ALBUMIN in the last 168 hours. No results for input(s): LIPASE, AMYLASE in the last 168 hours. No results for input(s): AMMONIA in the last 168 hours. Coagulation Profile: No results for input(s): INR, PROTIME in the last 168 hours. Cardiac Enzymes: No results for input(s): CKTOTAL, CKMB, CKMBINDEX, TROPONINI in the last 168 hours. BNP (last 3 results) No results for input(s): PROBNP in the last 8760 hours. HbA1C: No results for input(s): HGBA1C in the last 72 hours. CBG: No results for input(s): GLUCAP in the last 168 hours. Lipid Profile: No results for input(s): CHOL, HDL, LDLCALC, TRIG, CHOLHDL, LDLDIRECT in the last 72 hours. Thyroid Function Tests: No results for input(s): TSH, T4TOTAL, FREET4, T3FREE, THYROIDAB in the last 72 hours. Anemia Panel: No results for input(s):  VITAMINB12, FOLATE, FERRITIN, TIBC, IRON, RETICCTPCT in the last 72 hours. Urine analysis:    Component Value Date/Time   COLORURINE YELLOW 12/20/2013 [place] 12/20/2013 1602   LABSPEC >=1.030 11/08/2014 1420   PHURINE 6.5 11/08/2014 1420   GLUCOSEU NEGATIVE 11/08/2014 1420   HGBUR TRACE* 11/08/2014 1420   BILIRUBINUR NEGATIVE 11/08/2014 1420   KETONESUR NEGATIVE 11/08/2014 1420   PROTEINUR 100* 11/08/2014 1420   UROBILINOGEN  0.2 11/08/2014 1420   NITRITE NEGATIVE 11/08/2014 1420   LEUKOCYTESUR NEGATIVE 11/08/2014 1420   Sepsis Labs: !!!!!!!!!!!!!!!!!!!!!!!!!!!!!!!!!!!!!!!!!!!! @LABRCNTIP (procalcitonin:4,lacticidven:4) ) Recent Results (from the past 240 hour(s))  Culture, blood (routine x 2)     Status: None (Preliminary result)   Collection Time: 06/10/15 11:04 AM  Result Value Ref Range Status   Specimen Description BLOOD LEFT ARM  Final   Special Requests   Final    BOTTLES DRAWN AEROBIC AND ANAEROBIC 5CC Performed at [facility]    Culture PENDING  Incomplete   Report Status PENDING  Incomplete  Culture, blood (routine x 2)     Status: None (Preliminary result)   Collection Time: 06/10/15 11:06 AM  Result Value Ref Range Status   Specimen Description BLOOD RIGHT WRIST  Final   Special Requests   Final    BOTTLES DRAWN AEROBIC AND ANAEROBIC 5CC Performed at [facility]    Culture PENDING  Incomplete   Report Status PENDING  Incomplete     Radiological Exams on Admission: Dg Tibia/fibula Left  06/10/2015  CLINICAL DATA:  Chronic large bore open wounds on both tibia and fibula. EXAM: LEFT TIBIA AND FIBULA - 2 VIEW COMPARISON:  12/20/2013 FINDINGS: There is a diffuse soft tissue edema overlying the proximal tibia and fibula and the left ankle. Large areas of soft tissue ulceration overlie both the distal fibula and tibia. The fibula appears osteopenic and there is diffuse acute appearing periosteal reaction compatible with osteomyelitis.  Along the lateral aspect of the tibia there is also evidence of acute periosteal reaction and elevation medially and distally. Chronic appearing periosteal reaction and heterotopic bone formation overlies the medial aspect of the distal tibia. There is no fracture identified at this time. IMPRESSION: 1. Large areas of soft tissue ulceration overlying the distal tibia and fibula. 2. 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 Electronically Signed   By: Kerby Moors M.D.   On: 06/10/2015 12:04   Dg Tibia/fibula Right  06/10/2015  CLINICAL DATA:  Soft tissue wounds EXAM: RIGHT TIBIA AND FIBULA - 2 VIEW COMPARISON:  December 20, 2013 FINDINGS: Frontal lateral views were obtained. There is extensive soft tissue irregularity consistent with the extensive soft tissue wounds noted clinically. No well-defined soft tissue abscess evident. There is no acute fracture or dislocation. There is benign periosteal reaction along much of the fibula, a finding also present previously but slightly progressed, likely due to the chronic trophic changes from the soft tissue wounds. There is chronic fusion of the knee joint, a stable finding. No bony destruction evident. IMPRESSION: Extensive soft tissue abnormality consistent with the wounds described clinically. No well-defined soft tissue abscess by radiography. Benign periosteal thickening along the fibula, progressed from prior study. This finding is likely secondary to the soft tissue abnormalities. No erosive change or bony destruction. No fracture or dislocation. Chronic fusion of the knee joint. Electronically Signed   By: Lowella Grip III M.D.   On: 06/10/2015 12:00    EKG: Independently reviewed.   Assessment/Plan Active Problems:   Venous stasis   Cellulitis   Wound infection (HCC)   Essential hypertension   Infection, Pseudomonas  1. Infected chronic venous stasis wounds. Mrs Waninger evident history of chronic bilateral  lower extremity venous stasis ulcers who had been receiving care at the wound care clinic. Over the past month reporting increased malodor, discharge, pain. Wound cultures obtained at the wound care clinic at [facility] growing pseudomonas aeruginosa. There was concern  for active infection for which she was transferred to the emergency department. Will start her on empiric IV antibiotic therapy with IV Zosyn. Will obtain blood cultures 2 peripheral sites. Provide supportive care, as needed narcotic analgesia for wound pain (she has a history of chronic pain associated with venous stasis ulcers) admit to MedSurg. Consult wound care.   2.  History of myasthenia gravis. Stable, continue Mestinon 60 mg 4 times daily  3.  Hypertension. Blood pressure stable, continue lisinopril 10 mg by mouth daily.  4.  History of rheumatoid arthritis. She currently follows Dr. Lenna Gilford of rheumatology. Symptoms stable.  5.  Anemia of chronic disease. Initial lab work showing hemoglobin of 9.7, will monitor   DVT prophylaxis: Lovenox 40 mg subcutaneous daily Code Status: Full code Family Communication: Family not present Disposition Plan: Anticipate she will require with a 2 nights hospitalization Consults called: Wound care Admission status: Admit to MedSurg inpatient   Kelvin Cellar MD [facility] Pager [phone]  If 7PM-7AM, please contact night-coverage [email or web address] Password [email or web address]  06/10/2015, 2:54 PM

## 2015-06-11 DIAGNOSIS — L8990 Pressure ulcer of unspecified site, unspecified stage: Secondary | ICD-10-CM | POA: Insufficient documentation

## 2015-06-11 DIAGNOSIS — L97909 Non-pressure chronic ulcer of unspecified part of unspecified lower leg with unspecified severity: Secondary | ICD-10-CM

## 2015-06-11 DIAGNOSIS — L089 Local infection of the skin and subcutaneous tissue, unspecified: Secondary | ICD-10-CM

## 2015-06-11 DIAGNOSIS — B965 Pseudomonas (aeruginosa) (mallei) (pseudomallei) as the cause of diseases classified elsewhere: Secondary | ICD-10-CM

## 2015-06-11 DIAGNOSIS — I10 Essential (primary) hypertension: Secondary | ICD-10-CM

## 2015-06-11 DIAGNOSIS — T148 Other injury of unspecified body region: Secondary | ICD-10-CM

## 2015-06-11 DIAGNOSIS — I83009 Varicose veins of unspecified lower extremity with ulcer of unspecified site: Secondary | ICD-10-CM

## 2015-06-11 LAB — CBC
HCT: 30.6 % — ABNORMAL LOW (ref 36.0–46.0)
HEMOGLOBIN: 9.9 g/dL — AB (ref 12.0–15.0)
MCH: 23.3 pg — ABNORMAL LOW (ref 26.0–34.0)
MCHC: 32.4 g/dL (ref 30.0–36.0)
MCV: 72 fL — ABNORMAL LOW (ref 78.0–100.0)
Platelets: 268 10*3/uL (ref 150–400)
RBC: 4.25 MIL/uL (ref 3.87–5.11)
RDW: 17.5 % — AB (ref 11.5–15.5)
WBC: 7.4 10*3/uL (ref 4.0–10.5)

## 2015-06-11 LAB — BASIC METABOLIC PANEL
ANION GAP: 7 (ref 5–15)
BUN: 23 mg/dL — ABNORMAL HIGH (ref 6–20)
CALCIUM: 8.5 mg/dL — AB (ref 8.9–10.3)
CO2: 25 mmol/L (ref 22–32)
Chloride: 102 mmol/L (ref 101–111)
Creatinine, Ser: 1.08 mg/dL — ABNORMAL HIGH (ref 0.44–1.00)
GFR calc Af Amer: >60 mL/min (ref >60)
GFR, EST NON AFRICAN AMERICAN: 56 mL/min — AB (ref >60)
GLUCOSE: 96 mg/dL (ref 65–99)
Potassium: 4.1 mmol/L (ref 3.5–5.1)
SODIUM: 134 mmol/L — AB (ref 135–145)

## 2015-06-11 MED ORDER — CIPROFLOXACIN IN D5W 400 MG/200ML IV SOLN
400.0000 mg | Freq: Two times a day (BID) | INTRAVENOUS | Status: DC
  Administered 2015-06-11 – 2015-06-12 (×2): 400 mg via INTRAVENOUS
  Filled 2015-06-11 (×3): qty 200

## 2015-06-11 MED ORDER — HYDROCORTISONE 0.5 % EX CREA
TOPICAL_CREAM | CUTANEOUS | Status: DC | PRN
  Administered 2015-06-11: 04:00:00 via TOPICAL
  Filled 2015-06-11: qty 28.35

## 2015-06-11 NOTE — Progress Notes (Addendum)
PROGRESS NOTE    [name]  [id number] DOB: 02/05/1960 DOA: 06/10/2015  PCP: [name] Rima, MD  Outpatient Specialists: DR Foye Spurling, wound care- [phone]  Brief Narrative:  [name] is a 55 y.o. female with medical history significant of bilateral lower extremity chronic venous stasis ulcers since 2006, receiving her care at the wound center, also having history of myasthenia gravis, hypertension, congestive heart failure, rheumatoid arthritis, referred into the emergency department today for possible infected wounds. She states that over the past month she has noted increased pain, erythema, swelling, discharge coming from her multiple lower extremity wounds. She had a wound care eval and culture done on 5/8 at [facility] and was started on Doxycycline. Culture grew pseudomonas aeruginosa and was told on 5/14 to go to the ER. Did could not come right away due to transportation issues. Wound were found to be foul smelling in the ER. She was given Vanc and Zosyn. No leukocytosis or fevers.    Subjective: No complaints of pain, fever, cough, nausea vomiting diarrhea.   Assessment & Plan:   Principal Problem:   Venous stasis ulcers - with pseudomona's infection- have spoken with RN, Lachell, at wound care center- culture report being faxed- sensitive to Cipro- will change from zosyn to Cipro - wound care has evaluated her and she will continue her home wound care regimen - recommendations: "Xeroform wound contact layer with ABD dressings. Initially, we will change twice daily. Upon discharge, she may be able to go to once daily if we can improve the current condition with elevation. I am providing a therapeutic mattress with low air loss feature and bilateral pressure redistribution boots for pressure injury prevention."  Active Problems:     Essential hypertension - Lisinopril- hold HCTZ  Pressure ulcers  - multiple ulcers on buttocks and legs healing well- air  mattress  Mysthenia Gravis - Mestinon  Mild renal insufficiency- hyponatremia- pedal edema from venous stasis - Cr 0.8 at baseline- now at 1.2- holding HCTZ and hydrating but suspect that HCTZ is to decrease peripheral edema- will stop IVF- elevated legs instead   DVT prophylaxis: Lovenox Code Status: full code Family Communication:  Disposition Plan: home in 1-2 days Consultants:    Procedures:    Antimicrobials:  Anti-infectives    Start     Dose/Rate Route Frequency Ordered Stop   06/10/15 2000  piperacillin-tazobactam (ZOSYN) IVPB 3.375 g     3.375 g 12.5 mL/hr over 240 Minutes Intravenous Every 8 hours 06/10/15 1349     06/10/15 1130  piperacillin-tazobactam (ZOSYN) IVPB 3.375 g     3.375 g 12.5 mL/hr over 240 Minutes Intravenous  Once 06/10/15 1120 06/10/15 1604       Objective: Filed Vitals:   06/10/15 1457 06/10/15 1500 06/10/15 2043 06/11/15 0456  BP: 145/98  159/79 137/76  Pulse: 108  106 96  Temp: 98 F (36.7 C)  99.1 F (37.3 C) 99.6 F (37.6 C)  TempSrc: Oral  Oral Oral  Resp: 18  18 20   Height:      Weight:  98.4 kg (216 lb 14.9 oz)    SpO2: 100%  98% 97%    Intake/Output Summary (Last 24 hours) at 06/11/15 1314 Last data filed at 06/10/15 1500  Gross per 24 hour  Intake    240 ml  Output      0 ml  Net    240 ml   Filed Weights   06/10/15 1500  Weight: 98.4 kg (216 lb 14.9 oz)  Examination: General exam: Appears comfortable  HEENT: PERRLA, oral mucosa moist, no sclera icterus or thrush Respiratory system: Clear to auscultation. Respiratory effort normal. Cardiovascular system: S1 & S2 heard, RRR.  No murmurs  Gastrointestinal system: Abdomen soft, non-tender, nondistended. Normal bowel sound. No organomegaly Central nervous system: Alert and oriented. No focal neurological deficits. Extremities: No cyanosis, clubbing or edema Skin: extensive skin changes on legs with skin thickening, dark brown discoloration, large dicharging ulcers  on both legs- left leg ulcer is nearly circumferential  - numerous healing pressure ulcer on buttocks and legs which are not open - stage 3 ulcer on ischial tuberosity  Psychiatry:  Mood & affect appropriate.     Data Reviewed: I have personally reviewed following labs and imaging studies  CBC:  Recent Labs Lab 06/10/15 1104 06/11/15 0510  WBC 5.9 7.4  NEUTROABS 4.0  --   HGB 9.7* 9.9*  HCT 30.7* 30.6*  MCV 73.4* 72.0*  PLT 248 [id number]   Basic Metabolic Panel:  Recent Labs Lab 06/10/15 1104 06/11/15 0510  NA 136 134*  K 3.4* 4.1  CL 103 102  CO2 25 25  GLUCOSE 80 96  BUN 23* 23*  CREATININE 1.16* 1.08*  CALCIUM 8.7* 8.5*   GFR: Estimated Creatinine Clearance: 70.1 mL/min (by C-G formula based on Cr of 1.08). Liver Function Tests: No results for input(s): AST, ALT, ALKPHOS, BILITOT, PROT, ALBUMIN in the last 168 hours. No results for input(s): LIPASE, AMYLASE in the last 168 hours. No results for input(s): AMMONIA in the last 168 hours. Coagulation Profile: No results for input(s): INR, PROTIME in the last 168 hours. Cardiac Enzymes: No results for input(s): CKTOTAL, CKMB, CKMBINDEX, TROPONINI in the last 168 hours. BNP (last 3 results) No results for input(s): PROBNP in the last 8760 hours. HbA1C: No results for input(s): HGBA1C in the last 72 hours. CBG: No results for input(s): GLUCAP in the last 168 hours. Lipid Profile: No results for input(s): CHOL, HDL, LDLCALC, TRIG, CHOLHDL, LDLDIRECT in the last 72 hours. Thyroid Function Tests: No results for input(s): TSH, T4TOTAL, FREET4, T3FREE, THYROIDAB in the last 72 hours. Anemia Panel: No results for input(s): VITAMINB12, FOLATE, FERRITIN, TIBC, IRON, RETICCTPCT in the last 72 hours. Urine analysis:    Component Value Date/Time   COLORURINE YELLOW 12/20/2013 1602   APPEARANCEUR CLEAR 12/20/2013 1602   LABSPEC >=1.030 11/08/2014 1420   PHURINE 6.5 11/08/2014 1420   GLUCOSEU NEGATIVE 11/08/2014 1420    HGBUR TRACE* 11/08/2014 1420   BILIRUBINUR NEGATIVE 11/08/2014 1420   KETONESUR NEGATIVE 11/08/2014 1420   PROTEINUR 100* 11/08/2014 1420   UROBILINOGEN 0.2 11/08/2014 1420   NITRITE NEGATIVE 11/08/2014 1420   LEUKOCYTESUR NEGATIVE 11/08/2014 1420   Sepsis Labs: @LABRCNTIP (procalcitonin:4,lacticidven:4)  ) Recent Results (from the past 240 hour(s))  Culture, blood (routine x 2)     Status: None (Preliminary result)   Collection Time: 06/10/15 11:04 AM  Result Value Ref Range Status   Specimen Description BLOOD LEFT ARM  Final   Special Requests   Final    BOTTLES DRAWN AEROBIC AND ANAEROBIC 5CC Performed at [facility]    Culture PENDING  Incomplete   Report Status PENDING  Incomplete  Culture, blood (routine x 2)     Status: None (Preliminary result)   Collection Time: 06/10/15 11:06 AM  Result Value Ref Range Status   Specimen Description BLOOD RIGHT WRIST  Final   Special Requests   Final    BOTTLES DRAWN AEROBIC AND ANAEROBIC 5CC Performed  at [facility]    Culture PENDING  Incomplete   Report Status PENDING  Incomplete  MRSA PCR Screening     Status: None   Collection Time: 06/10/15  2:21 PM  Result Value Ref Range Status   MRSA by PCR NEGATIVE NEGATIVE Final    Comment:        The GeneXpert MRSA Assay (FDA approved for NASAL specimens only), is one component of a comprehensive MRSA colonization surveillance program. It is not intended to diagnose MRSA infection nor to guide or monitor treatment for MRSA infections.          Radiology Studies: Dg Tibia/fibula Left  06/10/2015  CLINICAL DATA:  Chronic large bore open wounds on both tibia and fibula. EXAM: LEFT TIBIA AND FIBULA - 2 VIEW COMPARISON:  12/20/2013 FINDINGS: There is a diffuse soft tissue edema overlying the proximal tibia and fibula and the left ankle. Large areas of soft tissue ulceration overlie both the distal fibula and tibia. The fibula appears osteopenic and there is  diffuse acute appearing periosteal reaction compatible with osteomyelitis. Along the lateral aspect of the tibia there is also evidence of acute periosteal reaction and elevation medially and distally. Chronic appearing periosteal reaction and heterotopic bone formation overlies the medial aspect of the distal tibia. There is no fracture identified at this time. IMPRESSION: 1. Large areas of soft tissue ulceration overlying the distal tibia and fibula. 2. 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 Electronically Signed   By: Kerby Moors M.D.   On: 06/10/2015 12:04   Dg Tibia/fibula Right  06/10/2015  CLINICAL DATA:  Soft tissue wounds EXAM: RIGHT TIBIA AND FIBULA - 2 VIEW COMPARISON:  December 20, 2013 FINDINGS: Frontal lateral views were obtained. There is extensive soft tissue irregularity consistent with the extensive soft tissue wounds noted clinically. No well-defined soft tissue abscess evident. There is no acute fracture or dislocation. There is benign periosteal reaction along much of the fibula, a finding also present previously but slightly progressed, likely due to the chronic trophic changes from the soft tissue wounds. There is chronic fusion of the knee joint, a stable finding. No bony destruction evident. IMPRESSION: Extensive soft tissue abnormality consistent with the wounds described clinically. No well-defined soft tissue abscess by radiography. Benign periosteal thickening along the fibula, progressed from prior study. This finding is likely secondary to the soft tissue abnormalities. No erosive change or bony destruction. No fracture or dislocation. Chronic fusion of the knee joint. Electronically Signed   By: Lowella Grip III M.D.   On: 06/10/2015 12:00        Scheduled Meds: . enoxaparin (LOVENOX) injection  40 mg Subcutaneous Q24H  . lisinopril  10 mg Oral Daily  . piperacillin-tazobactam  3.375 g Intravenous Q8H  . pyridostigmine  60  mg Oral QID   Continuous Infusions: . sodium chloride 75 mL/hr at 06/11/15 0837     LOS: 1 day    Time spent in minutes: 50    [place], MD [facility] Pager: [email or web address] Password [email or web address] 06/11/2015, 1:14 PM

## 2015-06-11 NOTE — Consult Note (Signed)
WOC wound consult note Reason for Consult:Patient known to our department from previous admissions. Seen today for suggestions for management of chronic venous stasis ulcerations.  Seen last week in the outpatient wound clinic at [facility]; needs [facility] for interim dressing changes.  Has been discharge from outpatient wound care clinics in the past due to transportation issues.  HHRN will not be able to perform wound care daily; patient has had intermittent success with self care in the past and has has larval presence during past admissions. Recommend higher level of care than is possible with intermittent services (e.g.HHRN).  Suggest SNF for wound care if patient would agree. Wound type:venous insufficiency Pressure Ulcer POA: Yes  Healing Stage 3 pressure injury to the left ischial tuberosity.  Recently healed right ischial tuberosity wound (full thickness) Measurement: Left IT:  0.4cm round x 0.2cm with pink, moist base and scant serous drainage RLE:  Lateral aspect:  10cm x 6cm x 0.2cm with red, moist base, non-granulating           Posterior aspect:  8cm x 4cm x 0.2cm with red, moist base, non-granulating LLE:  Near circumferential full thickness ulcer measuring approximately 10cm x 30cm x 0.2cm with smooth, moist, red base.  Large amounts of serous to light yellow exudate.  Musty odor. There is <4cm of intact skin preventing this from being circumferential. Wound bed:As described above. Drainage (amount, consistency, odor) As described above Periwound:Intact Dressing procedure/placement/frequency: I will continued the wound care previously implemented, that is xeroform wound contact layer with ABD dressings.  Initially, we will change twice daily.  Upon discharge, she may be able to go to once daily if we can improve the current condition with elevation.  I am providing a therapeutic mattress with low air loss feature and bilateral pressure redistribution boots for pressure injury prevention. [facility]  nursing team will not follow, but will remain available to this patient, the nursing and medical teams.  Please re-consult if needed. Thanks, Maudie Flakes, MSN, RN, [place], Arther Abbott  Pager# [phone]

## 2015-06-12 DIAGNOSIS — D509 Iron deficiency anemia, unspecified: Secondary | ICD-10-CM

## 2015-06-12 DIAGNOSIS — L03115 Cellulitis of right lower limb: Secondary | ICD-10-CM

## 2015-06-12 DIAGNOSIS — M069 Rheumatoid arthritis, unspecified: Secondary | ICD-10-CM

## 2015-06-12 DIAGNOSIS — G7000 Myasthenia gravis without (acute) exacerbation: Secondary | ICD-10-CM

## 2015-06-12 MED ORDER — CIPROFLOXACIN HCL 500 MG PO TABS: 500.0000 mg | ORAL_TABLET | Freq: Two times a day (BID) | ORAL | Status: AC

## 2015-06-12 NOTE — Progress Notes (Signed)
[id number] [name],BSN,RN3,CCM:  Bedside commode obtained for patient through [facility] dme.

## 2015-06-12 NOTE — Discharge Summary (Signed)
Physician Discharge Summary  [name] [id number] DOB: 03/07/1960 DOA: 06/10/2015  PCP: Helane Rima, MD  Admit date: 06/10/2015 Discharge date: 06/12/2015  Time spent: 60 minutes  Recommendations for Outpatient Follow-up:  1. Hold HCTZ- PCP should to [facility] in 1-2 wks and addresses Lisinopril if Cr does not improve.   Discharge Condition: stable    Discharge Diagnoses:  Principal Problem:   Venous stasis ulcers ([place]) Active Problems:   Myasthenia gravis (H[facility])   Rheumatoid arthritis (H[facility])   Anemia, iron deficiency   Wound infection ([place])   Essential hypertension   Infection, Pseudomonas   Pressure ulcer   History of present illness:  [name] is a 55 y.o. female with medical history significant of bilateral lower extremity chronic venous stasis ulcers since 2006, receiving her care at the wound center, also having history of myasthenia gravis, hypertension, congestive heart failure, rheumatoid arthritis, referred into the emergency department today for possible infected wounds. She states that over the past month she has noted increased pain, erythema, swelling, discharge coming from her multiple lower extremity wounds. Due to increased drainage from the wounds she had a culture done on 5/8 at [facility] wound care center and was started on Doxycycline. Culture grew pseudomonas aeruginosa and was told on 5/14 to go to the ER. Did could not come right away due to transportation issues. Wounds were found to be foul smelling in the ER. She was given Vanc and Zosyn. No leukocytosis or fevers.   Hospital Course:  Principal Problem:  Venous stasis ulcers/ cellulitis/ wound infection? - with pseudomona's infection vs colonization - have spoken with RN, Lachell, at wound care center-she has faxed culture report which shows multiple organisms including gr neg rods and heavy growth of Pseudomonas- pansensitive - has been on Zosyn and then Cipro in house- was on Doxy for a few  days at home - she states swelling and drainage have improved in the hospital -  will d/c with 5 more days of Cipro- on day 5 she has an appt at the wound care center - wound care has evaluated her in the hospital and she will continue her home wound care regimen   Active Problems:   Essential hypertension - Lisinopril- d/c HCTZ  Pressure ulcers  - multiple ulcers on buttocks and legs healing well- air mattress  CKD 3? dehydration - Cr 0.8 with a GFR > 90 in 02/2014 but rose to 1.14 with Cr < 60 in 5/16 - now at 1.2 on admission- BUN 23- will stop HCTZ - f/u Cr as outpt- give trial of d/cing Lisinopril if Cr does not improve  Rheumatoid Arthritis - tells me she is on Plaquenil at home   Mysthenia Gravis - Mestinon  Procedures:  none  Consultations:  none  Discharge Exam: Filed Weights   06/10/15 1500  Weight: 98.4 kg (216 lb 14.9 oz)   Filed Vitals:   06/11/15 2221 06/12/15 0429  BP: 153/83 155/75  Pulse: 80 82  Temp: 98.1 F (36.7 C) 98.5 F (36.9 C)  Resp: 20 20    General: AAO x 3, no distress Cardiovascular: RRR, no murmurs  Respiratory: clear to auscultation bilaterally GI: soft, non-tender, non-distended, bowel sound positive Skin: extensive skin changes on legs with skin thickening, dark brown discoloration, large dicharging ulcers on both legs- left leg ulcer is nearly circumferential  - numerous healing pressure ulcer on buttocks and legs which are not open - stage 3 ulcer on ischial tuberosity   Discharge Instructions You were  cared for by a hospitalist during your hospital stay. If you have any questions about your discharge medications or the care you received while you were in the hospital after you are discharged, you can call the unit and asked to speak with the hospitalist on call if the hospitalist that took care of you is not available. Once you are discharged, your primary care physician will handle any further medical issues. Please  note that NO REFILLS for any discharge medications will be authorized once you are discharged, as it is imperative that you return to your primary care physician (or establish a relationship with a primary care physician if you do not have one) for your aftercare needs so that they can reassess your need for medications and monitor your lab values.      Discharge Instructions    Diet - low sodium heart healthy    Complete by:  As directed      Discharge instructions    Complete by:  As directed   Ask your PCP to find you another medication to replace your HCTZ which is making you dehydrated and affecting your kidneys. PCP should to [facility] in 1-2 wks and addresses Lisinopril if Cr does not improve.     Increase activity slowly    Complete by:  As directed             Medication List    STOP taking these medications        doxycycline 100 MG capsule  Commonly known as:  VIBRAMYCIN     hydrochlorothiazide 25 MG tablet  Commonly known as:  HYDRODIURIL      TAKE these medications        albuterol 108 (90 Base) MCG/ACT inhaler  Commonly known as:  PROVENTIL HFA;VENTOLIN HFA  Inhale 4 puffs into the lungs every 6 (six) hours as needed. For shortness of breath     ciprofloxacin 500 MG tablet  Commonly known as:  CIPRO  Take 1 tablet (500 mg total) by mouth 2 (two) times daily.     clotrimazole-betamethasone cream  Commonly known as:  LOTRISONE  Apply 1 application topically daily as needed (dressing change).     COD LIVER OIL PO  Take 2 capsules by mouth daily.     diphenhydrAMINE 25 MG tablet  Commonly known as:  BENADRYL  Take 1 tablet (25 mg total) by mouth every 6 (six) hours as needed.     EPIPEN 0.3 mg/0.3 mL Devi  Generic drug:  EPINEPHrine  Inject 0.3 mg into the muscle as needed. For allergic reaction     famotidine 20 MG tablet  Commonly known as:  PEPCID  Take 20 mg by mouth 2 (two) times daily as needed for heartburn or indigestion.     ferrous sulfate 325  (65 FE) MG tablet  Take 325 mg by mouth daily with breakfast.     folic acid [id number] MCG tablet  Commonly known as:  FOLVITE  Take 400 mcg by mouth daily.     Garlic Oil [id number] MG Caps  Take 1,000 mg by mouth daily.     hydrocortisone 2.5 % cream  Apply 1 application topically every 6 (six) hours as needed (itching).     hydrocortisone cream 1 %  Apply topically 2 (two) times daily.     hydrOXYzine 25 MG tablet  Commonly known as:  ATARAX/VISTARIL  Take 1 tablet (25 mg total) by mouth every 4 (four) hours as needed for itching.  lidocaine 4 % external solution  Commonly known as:  XYLOCAINE  Apply 5 mLs topically 4 (four) times daily.     lisinopril 10 MG tablet  Commonly known as:  PRINIVIL,ZESTRIL  TAKE ONE TABLET BY MOUTH ONCE DAILY     multivitamin with minerals Tabs tablet  Take 1 tablet by mouth daily.     nivea cream  Apply 1 application topically 2 (two) times daily as needed for dry skin. Apply all over body     nystatin powder  Generic drug:  nystatin  Apply 1 g topically daily as needed (dressing change).     oxyCODONE 5 MG immediate release tablet  Commonly known as:  Oxy IR/ROXICODONE  Take 1 tablet (5 mg total) by mouth every 4 (four) hours as needed for severe pain.     pyridostigmine 60 MG tablet  Commonly known as:  MESTINON  Take 1 tablet (60 mg total) by mouth 4 (four) times daily.       Allergies  Allergen Reactions  . Asa [Aspirin] Shortness Of Breath, Rash and Anaphylaxis  . Cat Hair Extract Anaphylaxis  . Dilaudid [Hydromorphone Hcl] Shortness Of Breath and Palpitations  . Ibuprofen Anaphylaxis  . Tylenol [Acetaminophen] Rash, Other (See Comments) and Anaphylaxis    Other reaction(s): SHORTNESS OF BREATH Salivation and hearing loss  . Erythromycin Other (See Comments)    Burn veins  . Indomethacin Other (See Comments)    Other reaction(s): OTHER Stroke symptoms  . Naproxen Other (See Comments)    Other reaction(s): OTHER Renal  failure  . Nsaids Other (See Comments)    Kidney failure   . Tramadol     "stroke"  . Alginate-Collagen [Wound Dressings] Rash  . Gold-Containing Drug Products Rash  . Silvadene [Silver Sulfadiazine] Rash      The results of significant diagnostics from this hospitalization (including imaging, microbiology, ancillary and laboratory) are listed below for reference.    Significant Diagnostic Studies: Dg Tibia/fibula Left  06/10/2015  CLINICAL DATA:  Chronic large bore open wounds on both tibia and fibula. EXAM: LEFT TIBIA AND FIBULA - 2 VIEW COMPARISON:  12/20/2013 FINDINGS: There is a diffuse soft tissue edema overlying the proximal tibia and fibula and the left ankle. Large areas of soft tissue ulceration overlie both the distal fibula and tibia. The fibula appears osteopenic and there is diffuse acute appearing periosteal reaction compatible with osteomyelitis. Along the lateral aspect of the tibia there is also evidence of acute periosteal reaction and elevation medially and distally. Chronic appearing periosteal reaction and heterotopic bone formation overlies the medial aspect of the distal tibia. There is no fracture identified at this time. IMPRESSION: 1. Large areas of soft tissue ulceration overlying the distal tibia and fibula. 2. 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 Electronically Signed   By: Kerby Moors M.D.   On: 06/10/2015 12:04   Dg Tibia/fibula Right  06/10/2015  CLINICAL DATA:  Soft tissue wounds EXAM: RIGHT TIBIA AND FIBULA - 2 VIEW COMPARISON:  December 20, 2013 FINDINGS: Frontal lateral views were obtained. There is extensive soft tissue irregularity consistent with the extensive soft tissue wounds noted clinically. No well-defined soft tissue abscess evident. There is no acute fracture or dislocation. There is benign periosteal reaction along much of the fibula, a finding also present previously but slightly progressed, likely due  to the chronic trophic changes from the soft tissue wounds. There is chronic fusion of the knee joint, a stable finding. No bony destruction  evident. IMPRESSION: Extensive soft tissue abnormality consistent with the wounds described clinically. No well-defined soft tissue abscess by radiography. Benign periosteal thickening along the fibula, progressed from prior study. This finding is likely secondary to the soft tissue abnormalities. No erosive change or bony destruction. No fracture or dislocation. Chronic fusion of the knee joint. Electronically Signed   By: Lowella Grip III M.D.   On: 06/10/2015 12:00    Microbiology: Recent Results (from the past 240 hour(s))  Culture, blood (routine x 2)     Status: None (Preliminary result)   Collection Time: 06/10/15 11:04 AM  Result Value Ref Range Status   Specimen Description BLOOD LEFT ARM  Final   Special Requests BOTTLES DRAWN AEROBIC AND ANAEROBIC 5CC  Final   Culture   Final    NO GROWTH 1 DAY Performed at [facility]    Report Status PENDING  Incomplete  Culture, blood (routine x 2)     Status: None (Preliminary result)   Collection Time: 06/10/15 11:06 AM  Result Value Ref Range Status   Specimen Description BLOOD RIGHT WRIST  Final   Special Requests BOTTLES DRAWN AEROBIC AND ANAEROBIC 5CC  Final   Culture   Final    NO GROWTH 1 DAY Performed at [facility]    Report Status PENDING  Incomplete  MRSA PCR Screening     Status: None   Collection Time: 06/10/15  2:21 PM  Result Value Ref Range Status   MRSA by PCR NEGATIVE NEGATIVE Final    Comment:        The GeneXpert MRSA Assay (FDA approved for NASAL specimens only), is one component of a comprehensive MRSA colonization surveillance program. It is not intended to diagnose MRSA infection nor to guide or monitor treatment for MRSA infections.      Labs: Basic Metabolic Panel:  Recent Labs Lab 06/10/15 1104 06/11/15 0510  NA 136 134*  K 3.4* 4.1   CL 103 102  CO2 25 25  GLUCOSE 80 96  BUN 23* 23*  CREATININE 1.16* 1.08*  CALCIUM 8.7* 8.5*   Liver Function Tests: No results for input(s): AST, ALT, ALKPHOS, BILITOT, PROT, ALBUMIN in the last 168 hours. No results for input(s): LIPASE, AMYLASE in the last 168 hours. No results for input(s): AMMONIA in the last 168 hours. CBC:  Recent Labs Lab 06/10/15 1104 06/11/15 0510  WBC 5.9 7.4  NEUTROABS 4.0  --   HGB 9.7* 9.9*  HCT 30.7* 30.6*  MCV 73.4* 72.0*  PLT 248 268   Cardiac Enzymes: No results for input(s): CKTOTAL, CKMB, CKMBINDEX, TROPONINI in the last 168 hours. BNP: BNP (last 3 results) No results for input(s): BNP in the last 8760 hours.  ProBNP (last 3 results) No results for input(s): PROBNP in the last 8760 hours.  CBG: No results for input(s): GLUCAP in the last 168 hours.     SignedDebbe Odea, MD [facility] 06/12/2015, 11:28 AM

## 2015-06-15 LAB — CULTURE, BLOOD (ROUTINE X 2)
CULTURE: NO GROWTH
Culture: NO GROWTH

## 2015-06-16 DIAGNOSIS — I872 Venous insufficiency (chronic) (peripheral): Secondary | ICD-10-CM | POA: Diagnosis not present

## 2015-06-16 DIAGNOSIS — I890 Lymphedema, not elsewhere classified: Secondary | ICD-10-CM | POA: Diagnosis not present

## 2015-06-16 DIAGNOSIS — I83028 Varicose veins of left lower extremity with ulcer other part of lower leg: Secondary | ICD-10-CM | POA: Diagnosis not present

## 2015-06-16 DIAGNOSIS — I83018 Varicose veins of right lower extremity with ulcer other part of lower leg: Secondary | ICD-10-CM | POA: Diagnosis not present

## 2015-06-16 DIAGNOSIS — E669 Obesity, unspecified: Secondary | ICD-10-CM | POA: Diagnosis not present

## 2015-06-16 DIAGNOSIS — L97812 Non-pressure chronic ulcer of other part of right lower leg with fat layer exposed: Secondary | ICD-10-CM | POA: Diagnosis not present

## 2015-06-16 DIAGNOSIS — L089 Local infection of the skin and subcutaneous tissue, unspecified: Secondary | ICD-10-CM | POA: Diagnosis not present

## 2015-06-16 DIAGNOSIS — L97822 Non-pressure chronic ulcer of other part of left lower leg with fat layer exposed: Secondary | ICD-10-CM | POA: Diagnosis not present

## 2015-06-16 DIAGNOSIS — L89893 Pressure ulcer of other site, stage 3: Secondary | ICD-10-CM | POA: Diagnosis not present

## 2015-06-16 DIAGNOSIS — M069 Rheumatoid arthritis, unspecified: Secondary | ICD-10-CM | POA: Diagnosis not present

## 2015-06-16 DIAGNOSIS — Z9119 Patient's noncompliance with other medical treatment and regimen: Secondary | ICD-10-CM | POA: Diagnosis not present

## 2015-06-27 DIAGNOSIS — I10 Essential (primary) hypertension: Secondary | ICD-10-CM | POA: Diagnosis not present

## 2015-06-27 DIAGNOSIS — M069 Rheumatoid arthritis, unspecified: Secondary | ICD-10-CM | POA: Diagnosis not present

## 2015-06-27 DIAGNOSIS — Z1211 Encounter for screening for malignant neoplasm of colon: Secondary | ICD-10-CM | POA: Diagnosis not present

## 2015-06-27 DIAGNOSIS — L299 Pruritus, unspecified: Secondary | ICD-10-CM | POA: Diagnosis not present

## 2015-06-27 DIAGNOSIS — Z1239 Encounter for other screening for malignant neoplasm of breast: Secondary | ICD-10-CM | POA: Diagnosis not present

## 2015-07-22 DIAGNOSIS — M069 Rheumatoid arthritis, unspecified: Secondary | ICD-10-CM | POA: Diagnosis not present

## 2015-07-22 DIAGNOSIS — I83028 Varicose veins of left lower extremity with ulcer other part of lower leg: Secondary | ICD-10-CM | POA: Diagnosis not present

## 2015-07-22 DIAGNOSIS — L97822 Non-pressure chronic ulcer of other part of left lower leg with fat layer exposed: Secondary | ICD-10-CM | POA: Diagnosis not present

## 2015-07-22 DIAGNOSIS — L97912 Non-pressure chronic ulcer of unspecified part of right lower leg with fat layer exposed: Secondary | ICD-10-CM | POA: Diagnosis not present

## 2015-07-22 DIAGNOSIS — I83019 Varicose veins of right lower extremity with ulcer of unspecified site: Secondary | ICD-10-CM | POA: Diagnosis not present

## 2015-07-22 DIAGNOSIS — I872 Venous insufficiency (chronic) (peripheral): Secondary | ICD-10-CM | POA: Diagnosis not present

## 2015-07-22 DIAGNOSIS — E669 Obesity, unspecified: Secondary | ICD-10-CM | POA: Diagnosis not present

## 2015-07-22 DIAGNOSIS — I890 Lymphedema, not elsewhere classified: Secondary | ICD-10-CM | POA: Diagnosis not present

## 2015-07-22 DIAGNOSIS — Z9119 Patient's noncompliance with other medical treatment and regimen: Secondary | ICD-10-CM | POA: Diagnosis not present

## 2015-08-14 DIAGNOSIS — L89892 Pressure ulcer of other site, stage 2: Secondary | ICD-10-CM | POA: Diagnosis not present

## 2015-08-14 DIAGNOSIS — I890 Lymphedema, not elsewhere classified: Secondary | ICD-10-CM | POA: Diagnosis not present

## 2015-08-14 DIAGNOSIS — G7000 Myasthenia gravis without (acute) exacerbation: Secondary | ICD-10-CM | POA: Diagnosis not present

## 2015-08-14 DIAGNOSIS — M069 Rheumatoid arthritis, unspecified: Secondary | ICD-10-CM | POA: Diagnosis not present

## 2015-08-14 DIAGNOSIS — L97922 Non-pressure chronic ulcer of unspecified part of left lower leg with fat layer exposed: Secondary | ICD-10-CM | POA: Diagnosis not present

## 2015-08-14 DIAGNOSIS — T148 Other injury of unspecified body region: Secondary | ICD-10-CM | POA: Diagnosis not present

## 2015-08-14 DIAGNOSIS — L089 Local infection of the skin and subcutaneous tissue, unspecified: Secondary | ICD-10-CM | POA: Diagnosis not present

## 2015-08-14 DIAGNOSIS — L97912 Non-pressure chronic ulcer of unspecified part of right lower leg with fat layer exposed: Secondary | ICD-10-CM | POA: Diagnosis not present

## 2015-08-15 DIAGNOSIS — L298 Other pruritus: Secondary | ICD-10-CM | POA: Diagnosis not present

## 2015-08-15 DIAGNOSIS — G7000 Myasthenia gravis without (acute) exacerbation: Secondary | ICD-10-CM | POA: Diagnosis present

## 2015-08-15 DIAGNOSIS — M069 Rheumatoid arthritis, unspecified: Secondary | ICD-10-CM | POA: Diagnosis present

## 2015-08-15 DIAGNOSIS — J45909 Unspecified asthma, uncomplicated: Secondary | ICD-10-CM | POA: Diagnosis present

## 2015-08-15 DIAGNOSIS — M7989 Other specified soft tissue disorders: Secondary | ICD-10-CM | POA: Diagnosis not present

## 2015-08-15 DIAGNOSIS — Z885 Allergy status to narcotic agent status: Secondary | ICD-10-CM | POA: Diagnosis not present

## 2015-08-15 DIAGNOSIS — Z91048 Other nonmedicinal substance allergy status: Secondary | ICD-10-CM | POA: Diagnosis not present

## 2015-08-15 DIAGNOSIS — A498 Other bacterial infections of unspecified site: Secondary | ICD-10-CM | POA: Diagnosis not present

## 2015-08-15 DIAGNOSIS — L97912 Non-pressure chronic ulcer of unspecified part of right lower leg with fat layer exposed: Secondary | ICD-10-CM | POA: Diagnosis not present

## 2015-08-15 DIAGNOSIS — L97829 Non-pressure chronic ulcer of other part of left lower leg with unspecified severity: Secondary | ICD-10-CM | POA: Diagnosis present

## 2015-08-15 DIAGNOSIS — I872 Venous insufficiency (chronic) (peripheral): Secondary | ICD-10-CM | POA: Diagnosis not present

## 2015-08-15 DIAGNOSIS — I83028 Varicose veins of left lower extremity with ulcer other part of lower leg: Secondary | ICD-10-CM | POA: Diagnosis present

## 2015-08-15 DIAGNOSIS — Z888 Allergy status to other drugs, medicaments and biological substances status: Secondary | ICD-10-CM | POA: Diagnosis not present

## 2015-08-15 DIAGNOSIS — I83019 Varicose veins of right lower extremity with ulcer of unspecified site: Secondary | ICD-10-CM | POA: Diagnosis not present

## 2015-08-15 DIAGNOSIS — Z79899 Other long term (current) drug therapy: Secondary | ICD-10-CM | POA: Diagnosis not present

## 2015-08-15 DIAGNOSIS — I83018 Varicose veins of right lower extremity with ulcer other part of lower leg: Secondary | ICD-10-CM | POA: Diagnosis present

## 2015-08-15 DIAGNOSIS — I509 Heart failure, unspecified: Secondary | ICD-10-CM | POA: Diagnosis present

## 2015-08-15 DIAGNOSIS — Z591 Inadequate housing: Secondary | ICD-10-CM | POA: Diagnosis not present

## 2015-08-15 DIAGNOSIS — L97922 Non-pressure chronic ulcer of unspecified part of left lower leg with fat layer exposed: Secondary | ICD-10-CM | POA: Diagnosis not present

## 2015-08-15 DIAGNOSIS — L97819 Non-pressure chronic ulcer of other part of right lower leg with unspecified severity: Secondary | ICD-10-CM | POA: Diagnosis present

## 2015-08-15 DIAGNOSIS — Z86718 Personal history of other venous thrombosis and embolism: Secondary | ICD-10-CM | POA: Diagnosis not present

## 2015-08-15 DIAGNOSIS — L8990 Pressure ulcer of unspecified site, unspecified stage: Secondary | ICD-10-CM | POA: Diagnosis not present

## 2015-08-15 DIAGNOSIS — I110 Hypertensive heart disease with heart failure: Secondary | ICD-10-CM | POA: Diagnosis present

## 2015-08-15 DIAGNOSIS — Z881 Allergy status to other antibiotic agents status: Secondary | ICD-10-CM | POA: Diagnosis not present

## 2015-08-15 DIAGNOSIS — L299 Pruritus, unspecified: Secondary | ICD-10-CM | POA: Diagnosis present

## 2015-08-15 DIAGNOSIS — I739 Peripheral vascular disease, unspecified: Secondary | ICD-10-CM | POA: Diagnosis not present

## 2015-08-15 DIAGNOSIS — I83029 Varicose veins of left lower extremity with ulcer of unspecified site: Secondary | ICD-10-CM | POA: Diagnosis not present

## 2015-08-15 DIAGNOSIS — I890 Lymphedema, not elsewhere classified: Secondary | ICD-10-CM | POA: Diagnosis not present

## 2015-08-15 DIAGNOSIS — D500 Iron deficiency anemia secondary to blood loss (chronic): Secondary | ICD-10-CM | POA: Diagnosis present

## 2015-08-15 DIAGNOSIS — I10 Essential (primary) hypertension: Secondary | ICD-10-CM | POA: Diagnosis not present

## 2015-08-15 DIAGNOSIS — I779 Disorder of arteries and arterioles, unspecified: Secondary | ICD-10-CM | POA: Diagnosis not present

## 2015-08-23 DIAGNOSIS — L97822 Non-pressure chronic ulcer of other part of left lower leg with fat layer exposed: Secondary | ICD-10-CM | POA: Diagnosis not present

## 2015-08-23 DIAGNOSIS — I83018 Varicose veins of right lower extremity with ulcer other part of lower leg: Secondary | ICD-10-CM | POA: Diagnosis not present

## 2015-08-23 DIAGNOSIS — L97812 Non-pressure chronic ulcer of other part of right lower leg with fat layer exposed: Secondary | ICD-10-CM | POA: Diagnosis not present

## 2015-08-23 DIAGNOSIS — M069 Rheumatoid arthritis, unspecified: Secondary | ICD-10-CM | POA: Diagnosis not present

## 2015-08-23 DIAGNOSIS — I110 Hypertensive heart disease with heart failure: Secondary | ICD-10-CM | POA: Diagnosis not present

## 2015-08-23 DIAGNOSIS — J45909 Unspecified asthma, uncomplicated: Secondary | ICD-10-CM | POA: Diagnosis not present

## 2015-08-23 DIAGNOSIS — I509 Heart failure, unspecified: Secondary | ICD-10-CM | POA: Diagnosis not present

## 2015-08-23 DIAGNOSIS — G7000 Myasthenia gravis without (acute) exacerbation: Secondary | ICD-10-CM | POA: Diagnosis not present

## 2015-08-23 DIAGNOSIS — E669 Obesity, unspecified: Secondary | ICD-10-CM | POA: Diagnosis not present

## 2015-08-23 DIAGNOSIS — I83028 Varicose veins of left lower extremity with ulcer other part of lower leg: Secondary | ICD-10-CM | POA: Diagnosis not present

## 2015-08-25 DIAGNOSIS — I83028 Varicose veins of left lower extremity with ulcer other part of lower leg: Secondary | ICD-10-CM | POA: Diagnosis not present

## 2015-08-25 DIAGNOSIS — L97822 Non-pressure chronic ulcer of other part of left lower leg with fat layer exposed: Secondary | ICD-10-CM | POA: Diagnosis not present

## 2015-08-25 DIAGNOSIS — L97812 Non-pressure chronic ulcer of other part of right lower leg with fat layer exposed: Secondary | ICD-10-CM | POA: Diagnosis not present

## 2015-08-25 DIAGNOSIS — I83018 Varicose veins of right lower extremity with ulcer other part of lower leg: Secondary | ICD-10-CM | POA: Diagnosis not present

## 2015-08-25 DIAGNOSIS — I110 Hypertensive heart disease with heart failure: Secondary | ICD-10-CM | POA: Diagnosis not present

## 2015-08-25 DIAGNOSIS — I509 Heart failure, unspecified: Secondary | ICD-10-CM | POA: Diagnosis not present

## 2015-08-30 DIAGNOSIS — I509 Heart failure, unspecified: Secondary | ICD-10-CM | POA: Diagnosis not present

## 2015-08-30 DIAGNOSIS — L97822 Non-pressure chronic ulcer of other part of left lower leg with fat layer exposed: Secondary | ICD-10-CM | POA: Diagnosis not present

## 2015-08-30 DIAGNOSIS — I83018 Varicose veins of right lower extremity with ulcer other part of lower leg: Secondary | ICD-10-CM | POA: Diagnosis not present

## 2015-08-30 DIAGNOSIS — L97812 Non-pressure chronic ulcer of other part of right lower leg with fat layer exposed: Secondary | ICD-10-CM | POA: Diagnosis not present

## 2015-08-30 DIAGNOSIS — I110 Hypertensive heart disease with heart failure: Secondary | ICD-10-CM | POA: Diagnosis not present

## 2015-08-30 DIAGNOSIS — I83028 Varicose veins of left lower extremity with ulcer other part of lower leg: Secondary | ICD-10-CM | POA: Diagnosis not present

## 2015-09-03 DIAGNOSIS — I83018 Varicose veins of right lower extremity with ulcer other part of lower leg: Secondary | ICD-10-CM | POA: Diagnosis not present

## 2015-09-03 DIAGNOSIS — L97822 Non-pressure chronic ulcer of other part of left lower leg with fat layer exposed: Secondary | ICD-10-CM | POA: Diagnosis not present

## 2015-09-03 DIAGNOSIS — I509 Heart failure, unspecified: Secondary | ICD-10-CM | POA: Diagnosis not present

## 2015-09-03 DIAGNOSIS — I83028 Varicose veins of left lower extremity with ulcer other part of lower leg: Secondary | ICD-10-CM | POA: Diagnosis not present

## 2015-09-03 DIAGNOSIS — L97812 Non-pressure chronic ulcer of other part of right lower leg with fat layer exposed: Secondary | ICD-10-CM | POA: Diagnosis not present

## 2015-09-03 DIAGNOSIS — I110 Hypertensive heart disease with heart failure: Secondary | ICD-10-CM | POA: Diagnosis not present

## 2015-09-06 DIAGNOSIS — I83028 Varicose veins of left lower extremity with ulcer other part of lower leg: Secondary | ICD-10-CM | POA: Diagnosis not present

## 2015-09-06 DIAGNOSIS — I83018 Varicose veins of right lower extremity with ulcer other part of lower leg: Secondary | ICD-10-CM | POA: Diagnosis not present

## 2015-09-06 DIAGNOSIS — L97822 Non-pressure chronic ulcer of other part of left lower leg with fat layer exposed: Secondary | ICD-10-CM | POA: Diagnosis not present

## 2015-09-06 DIAGNOSIS — I110 Hypertensive heart disease with heart failure: Secondary | ICD-10-CM | POA: Diagnosis not present

## 2015-09-06 DIAGNOSIS — I509 Heart failure, unspecified: Secondary | ICD-10-CM | POA: Diagnosis not present

## 2015-09-06 DIAGNOSIS — L97812 Non-pressure chronic ulcer of other part of right lower leg with fat layer exposed: Secondary | ICD-10-CM | POA: Diagnosis not present

## 2015-09-10 DIAGNOSIS — I83028 Varicose veins of left lower extremity with ulcer other part of lower leg: Secondary | ICD-10-CM | POA: Diagnosis not present

## 2015-09-10 DIAGNOSIS — L97822 Non-pressure chronic ulcer of other part of left lower leg with fat layer exposed: Secondary | ICD-10-CM | POA: Diagnosis not present

## 2015-09-10 DIAGNOSIS — L97812 Non-pressure chronic ulcer of other part of right lower leg with fat layer exposed: Secondary | ICD-10-CM | POA: Diagnosis not present

## 2015-09-10 DIAGNOSIS — I83018 Varicose veins of right lower extremity with ulcer other part of lower leg: Secondary | ICD-10-CM | POA: Diagnosis not present

## 2015-09-10 DIAGNOSIS — I110 Hypertensive heart disease with heart failure: Secondary | ICD-10-CM | POA: Diagnosis not present

## 2015-09-10 DIAGNOSIS — I509 Heart failure, unspecified: Secondary | ICD-10-CM | POA: Diagnosis not present

## 2015-09-11 DIAGNOSIS — L89893 Pressure ulcer of other site, stage 3: Secondary | ICD-10-CM | POA: Diagnosis not present

## 2015-09-11 DIAGNOSIS — M069 Rheumatoid arthritis, unspecified: Secondary | ICD-10-CM | POA: Diagnosis not present

## 2015-09-11 DIAGNOSIS — L97922 Non-pressure chronic ulcer of unspecified part of left lower leg with fat layer exposed: Secondary | ICD-10-CM | POA: Diagnosis not present

## 2015-09-11 DIAGNOSIS — G7000 Myasthenia gravis without (acute) exacerbation: Secondary | ICD-10-CM | POA: Diagnosis not present

## 2015-09-11 DIAGNOSIS — L89892 Pressure ulcer of other site, stage 2: Secondary | ICD-10-CM | POA: Diagnosis not present

## 2015-09-11 DIAGNOSIS — I890 Lymphedema, not elsewhere classified: Secondary | ICD-10-CM | POA: Diagnosis not present

## 2015-09-11 DIAGNOSIS — I872 Venous insufficiency (chronic) (peripheral): Secondary | ICD-10-CM | POA: Diagnosis not present

## 2015-09-11 DIAGNOSIS — L97912 Non-pressure chronic ulcer of unspecified part of right lower leg with fat layer exposed: Secondary | ICD-10-CM | POA: Diagnosis not present

## 2015-09-15 DIAGNOSIS — L97822 Non-pressure chronic ulcer of other part of left lower leg with fat layer exposed: Secondary | ICD-10-CM | POA: Diagnosis not present

## 2015-09-15 DIAGNOSIS — I509 Heart failure, unspecified: Secondary | ICD-10-CM | POA: Diagnosis not present

## 2015-09-15 DIAGNOSIS — I83028 Varicose veins of left lower extremity with ulcer other part of lower leg: Secondary | ICD-10-CM | POA: Diagnosis not present

## 2015-09-15 DIAGNOSIS — I110 Hypertensive heart disease with heart failure: Secondary | ICD-10-CM | POA: Diagnosis not present

## 2015-09-15 DIAGNOSIS — L97812 Non-pressure chronic ulcer of other part of right lower leg with fat layer exposed: Secondary | ICD-10-CM | POA: Diagnosis not present

## 2015-09-15 DIAGNOSIS — I83018 Varicose veins of right lower extremity with ulcer other part of lower leg: Secondary | ICD-10-CM | POA: Diagnosis not present

## 2015-09-24 DIAGNOSIS — I83018 Varicose veins of right lower extremity with ulcer other part of lower leg: Secondary | ICD-10-CM | POA: Diagnosis not present

## 2015-09-24 DIAGNOSIS — L97812 Non-pressure chronic ulcer of other part of right lower leg with fat layer exposed: Secondary | ICD-10-CM | POA: Diagnosis not present

## 2015-09-24 DIAGNOSIS — I509 Heart failure, unspecified: Secondary | ICD-10-CM | POA: Diagnosis not present

## 2015-09-24 DIAGNOSIS — I110 Hypertensive heart disease with heart failure: Secondary | ICD-10-CM | POA: Diagnosis not present

## 2015-09-24 DIAGNOSIS — L97822 Non-pressure chronic ulcer of other part of left lower leg with fat layer exposed: Secondary | ICD-10-CM | POA: Diagnosis not present

## 2015-09-24 DIAGNOSIS — I83028 Varicose veins of left lower extremity with ulcer other part of lower leg: Secondary | ICD-10-CM | POA: Diagnosis not present

## 2015-10-10 DIAGNOSIS — L89892 Pressure ulcer of other site, stage 2: Secondary | ICD-10-CM | POA: Diagnosis not present

## 2015-10-10 DIAGNOSIS — M069 Rheumatoid arthritis, unspecified: Secondary | ICD-10-CM | POA: Diagnosis not present

## 2015-10-10 DIAGNOSIS — L97922 Non-pressure chronic ulcer of unspecified part of left lower leg with fat layer exposed: Secondary | ICD-10-CM | POA: Diagnosis not present

## 2015-10-10 DIAGNOSIS — L89322 Pressure ulcer of left buttock, stage 2: Secondary | ICD-10-CM | POA: Diagnosis not present

## 2015-10-10 DIAGNOSIS — L97912 Non-pressure chronic ulcer of unspecified part of right lower leg with fat layer exposed: Secondary | ICD-10-CM | POA: Diagnosis not present

## 2015-10-10 DIAGNOSIS — I890 Lymphedema, not elsewhere classified: Secondary | ICD-10-CM | POA: Diagnosis not present

## 2015-10-10 DIAGNOSIS — L89312 Pressure ulcer of right buttock, stage 2: Secondary | ICD-10-CM | POA: Diagnosis not present

## 2015-10-10 DIAGNOSIS — G7000 Myasthenia gravis without (acute) exacerbation: Secondary | ICD-10-CM | POA: Diagnosis not present

## 2015-10-10 DIAGNOSIS — L89893 Pressure ulcer of other site, stage 3: Secondary | ICD-10-CM | POA: Diagnosis not present

## 2015-12-27 ENCOUNTER — Other Ambulatory Visit: Payer: Self-pay | Admitting: Neurology

## 2015-12-30 DIAGNOSIS — Z1231 Encounter for screening mammogram for malignant neoplasm of breast: Secondary | ICD-10-CM | POA: Diagnosis not present

## 2015-12-30 DIAGNOSIS — I10 Essential (primary) hypertension: Secondary | ICD-10-CM | POA: Diagnosis not present

## 2016-01-07 ENCOUNTER — Telehealth: Payer: Self-pay | Admitting: Neurology

## 2016-01-07 NOTE — Telephone Encounter (Signed)
Spoke to patient - she was last seen in our office on 10/05/13 and her last prescription was provided on 01/10/15 with no refills.  Says she has been off medications for about two months now.  She wants an appt to discuss restarting treatment.  I offered her multiple appts in December but she declined.  She has decided to keep her appt on 01/29/16.

## 2016-01-07 NOTE — Telephone Encounter (Addendum)
Pt request refill for pyridostigmine (MESTINON) 60 MG tablet . Pt has been out of medication for 2 days. appt has been scheduled with Hoyle Sauer 01/29/16.

## 2016-01-29 ENCOUNTER — Ambulatory Visit: Payer: Medicare Other | Admitting: Nurse Practitioner

## 2016-01-30 ENCOUNTER — Encounter: Payer: Self-pay | Admitting: Nurse Practitioner

## 2016-02-17 ENCOUNTER — Ambulatory Visit: Payer: Medicare Other | Admitting: Neurology

## 2016-03-16 ENCOUNTER — Ambulatory Visit: Payer: Medicare Other | Admitting: Neurology

## 2016-04-25 DIAGNOSIS — 419620001 Death: Secondary | SNOMED CT | POA: Diagnosis not present

## 2016-04-25 DEATH — deceased

## 2016-06-29 IMAGING — CR DG LUMBAR SPINE COMPLETE 4+V
5 series · 5 of 5 positions shown · non-contrast
Comparison: None.

CLINICAL DATA: The patient fell earlier today

EXAM:
LUMBAR SPINE - COMPLETE 4+ VIEW

[t lumbar spine ap]
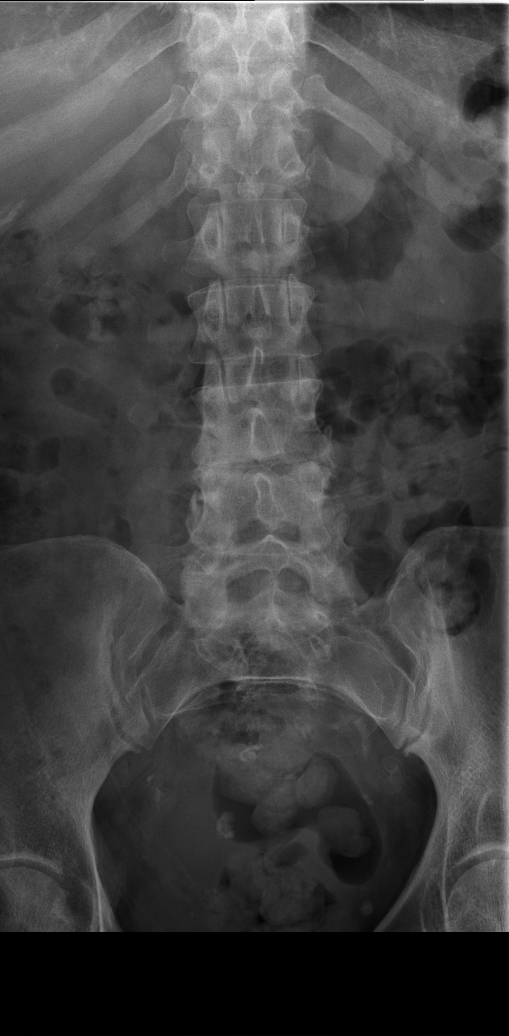

[t lumbar spine obl (1 of 2)]
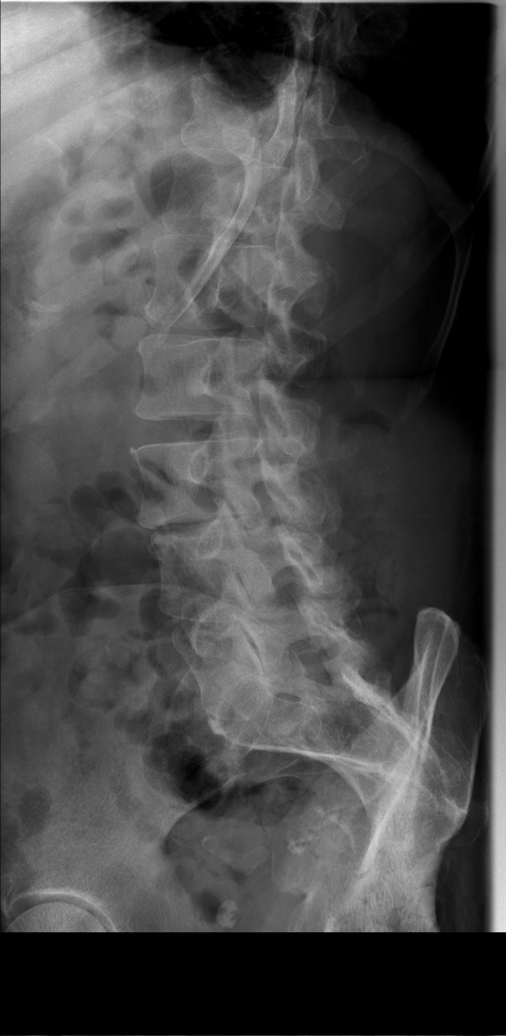

[t lumbar spine obl (2 of 2)]
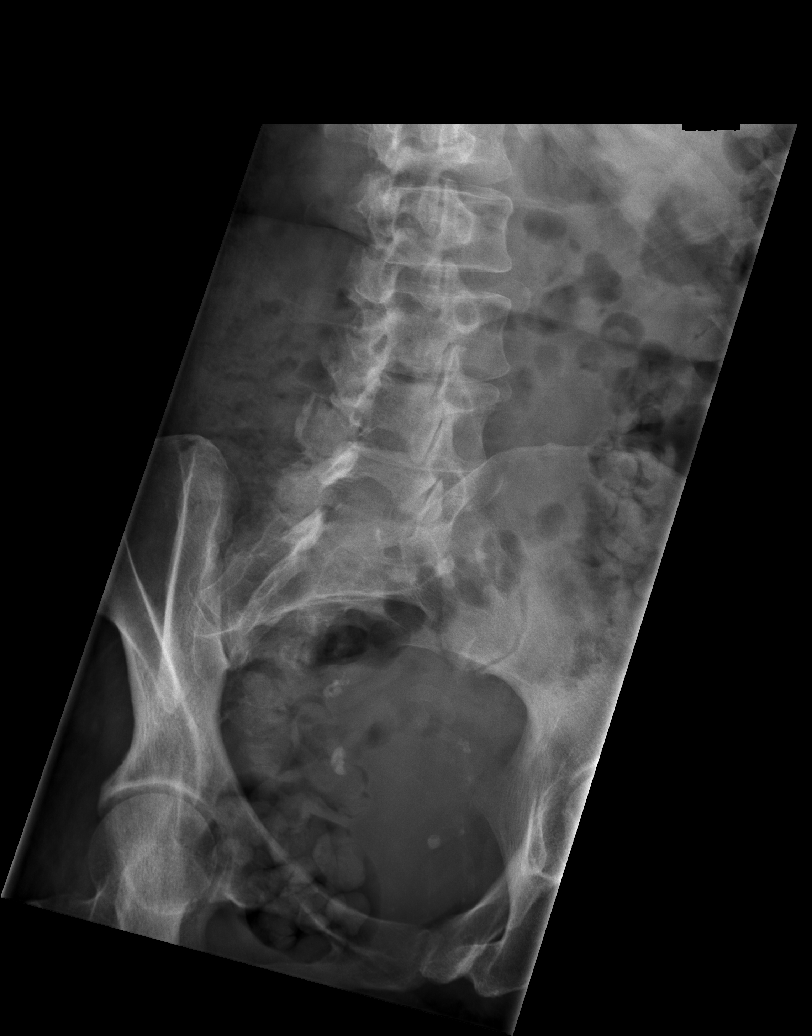

[t lumbar spine lat]
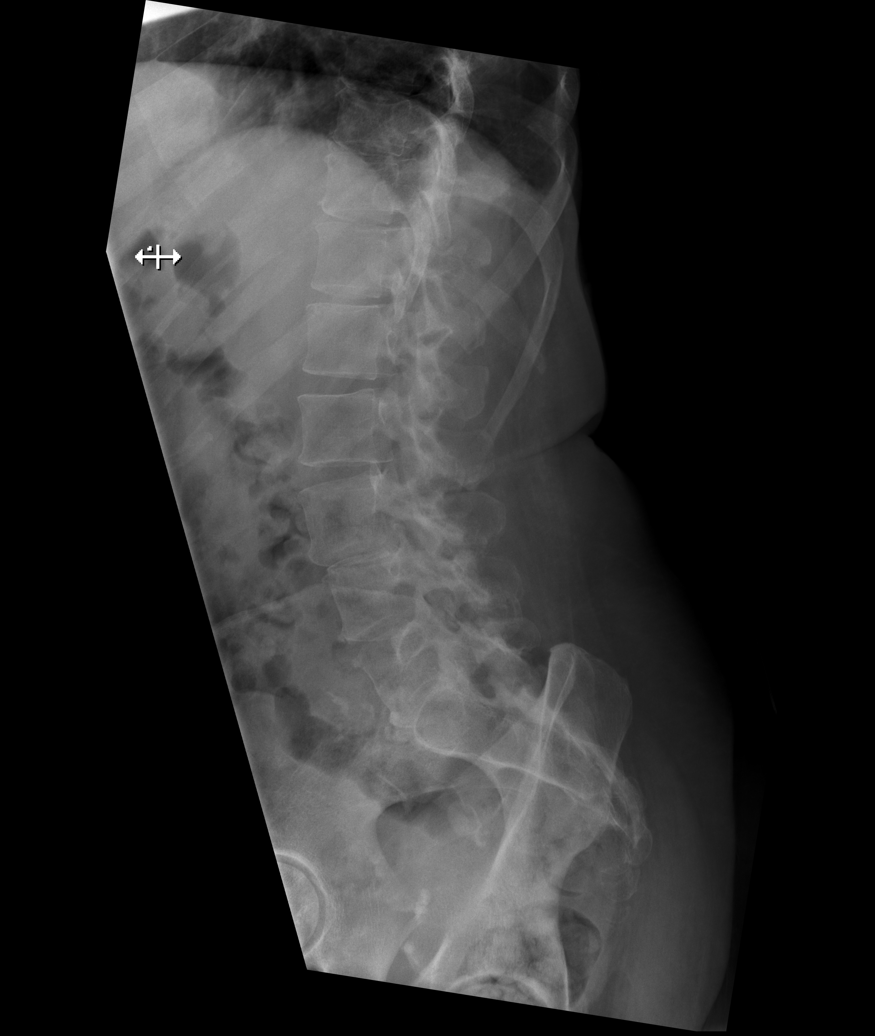

[t lumbar l-5 s-1 spot]
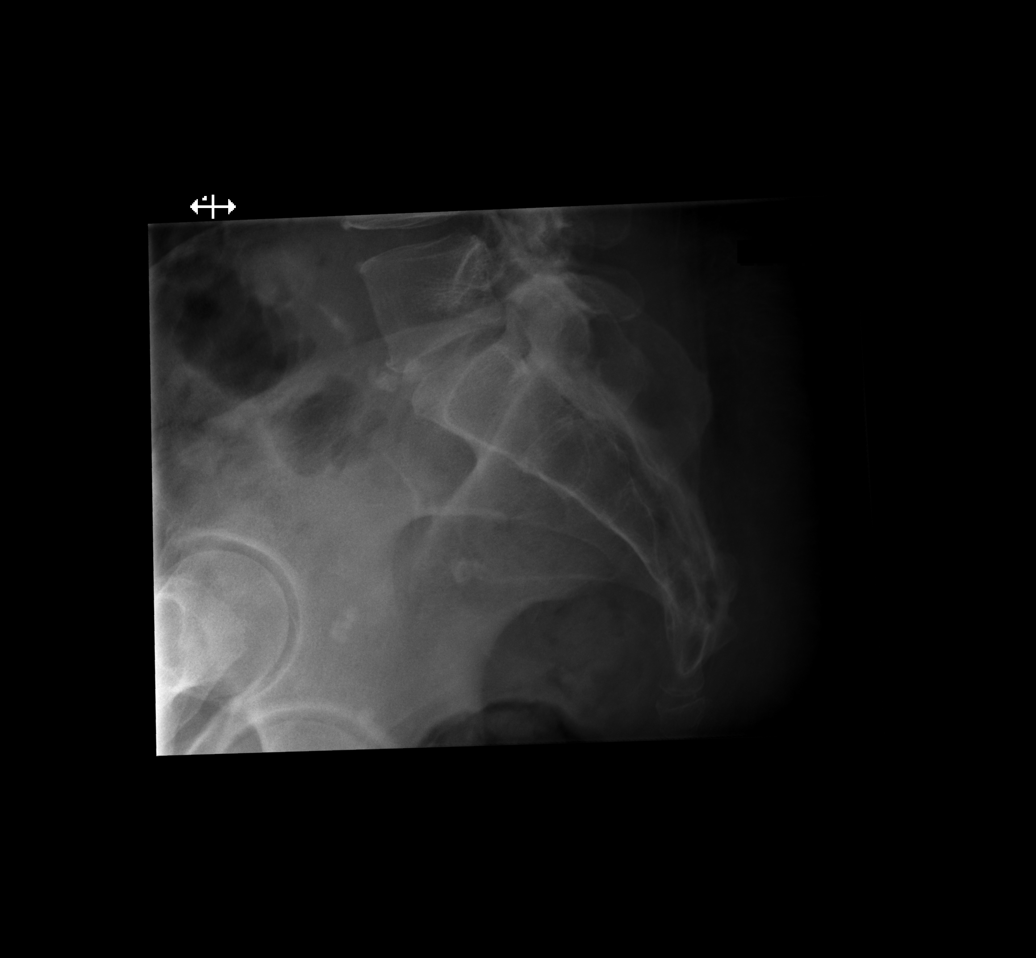

[5 of 5 positions shown; findings below may reference images not displayed]

FINDINGS: Frontal, lateral, spot lumbosacral lateral, and bilateral oblique
views were obtained. There are 5 non-rib-bearing lumbar type
vertebral bodies. There is no fracture or spondylolisthesis. There
is moderate disc space narrowing at L5-S1. There is slight disc
space narrowing at L3-4 and L4-5. There is facet osteoarthritic
change at L4-5 and L5-S1 bilaterally. No erosive change.
IMPRESSION: Areas of osteoarthritic change.  No fracture or spondylolisthesis.

## 2016-06-29 IMAGING — CR DG TIBIA/FIBULA 2V*L*
4 series · 4 of 4 positions shown · non-contrast
Comparison: Radiograph 08/26/2010.

CLINICAL DATA: Pt fell at [DATE] this AM, landed on buttocks, per
EMS small skin tear left buttocks

EXAM:
LEFT TIBIA AND FIBULA - 2 VIEW

[t tib-fib ap left (1 of 2)]
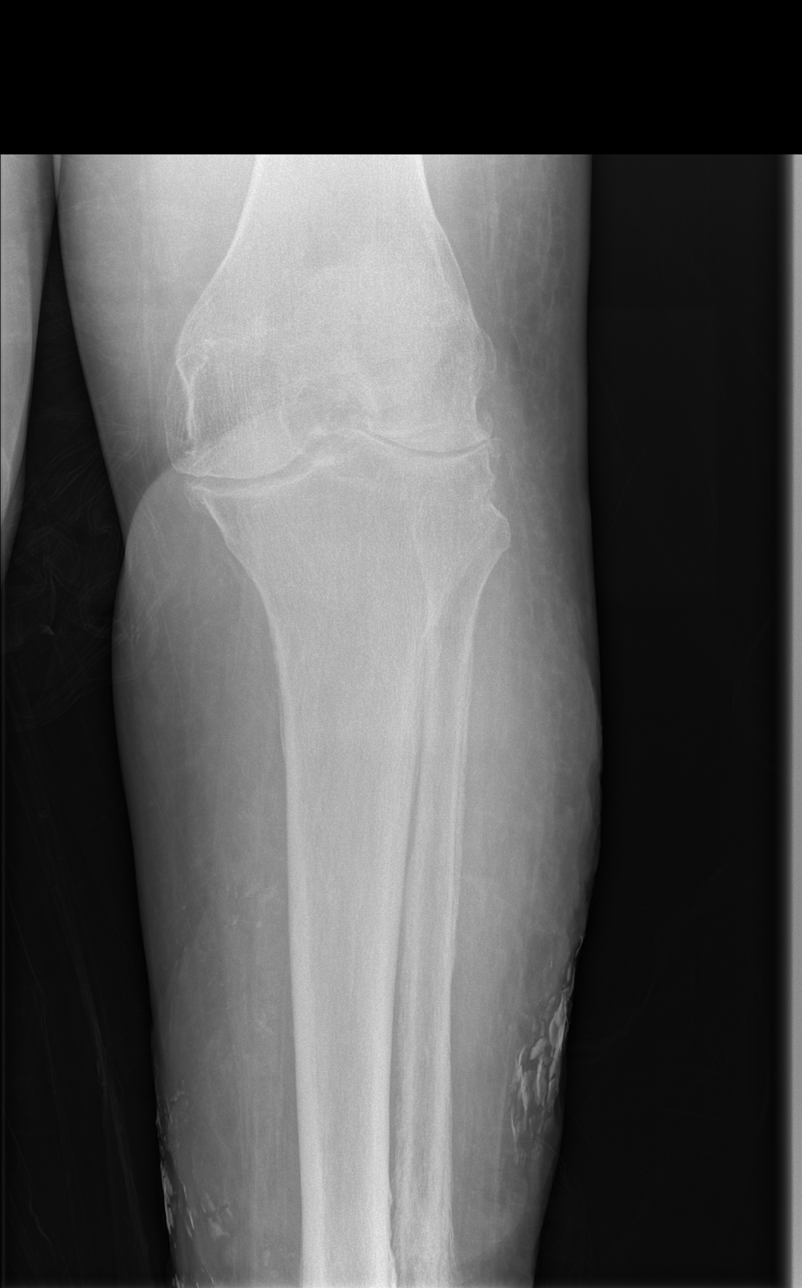

[t tib-fib ap left (2 of 2)]
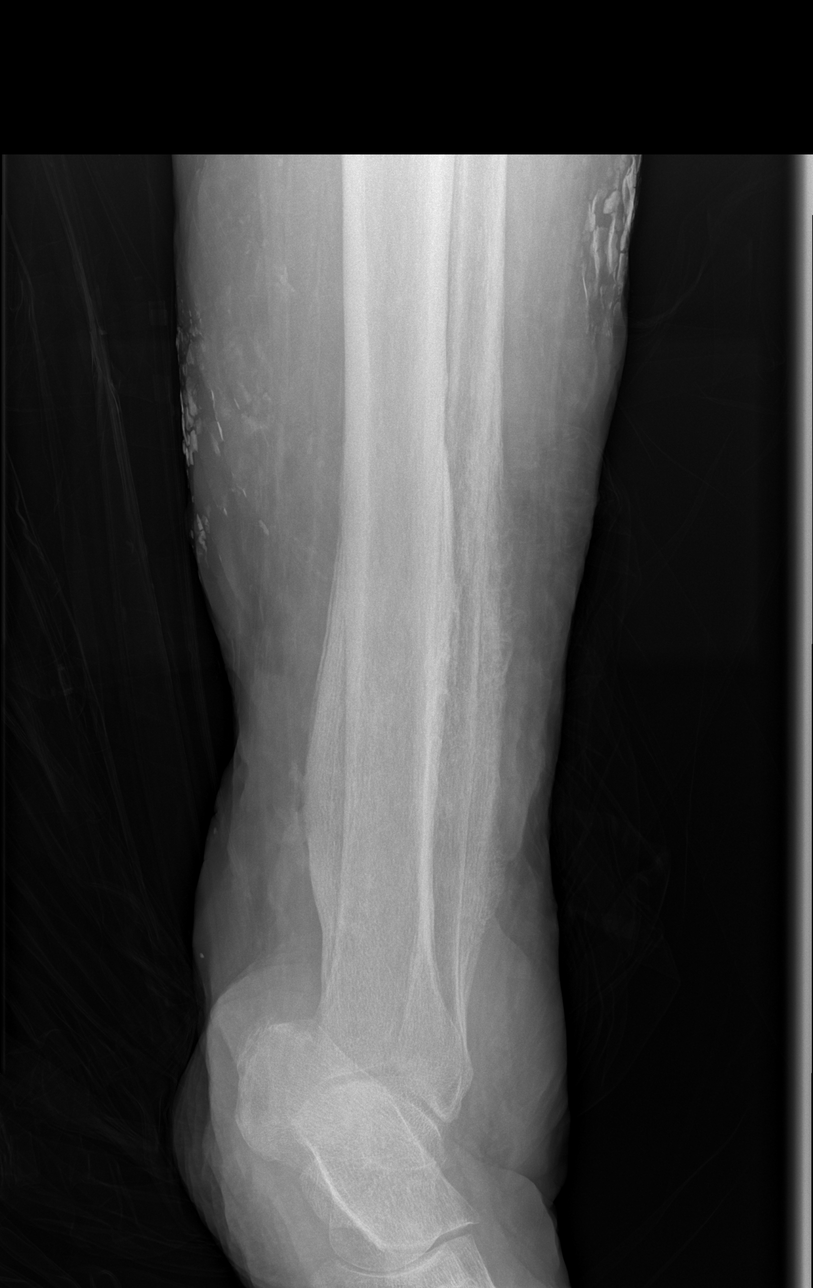

[x tib-fib lat left (1 of 2)]
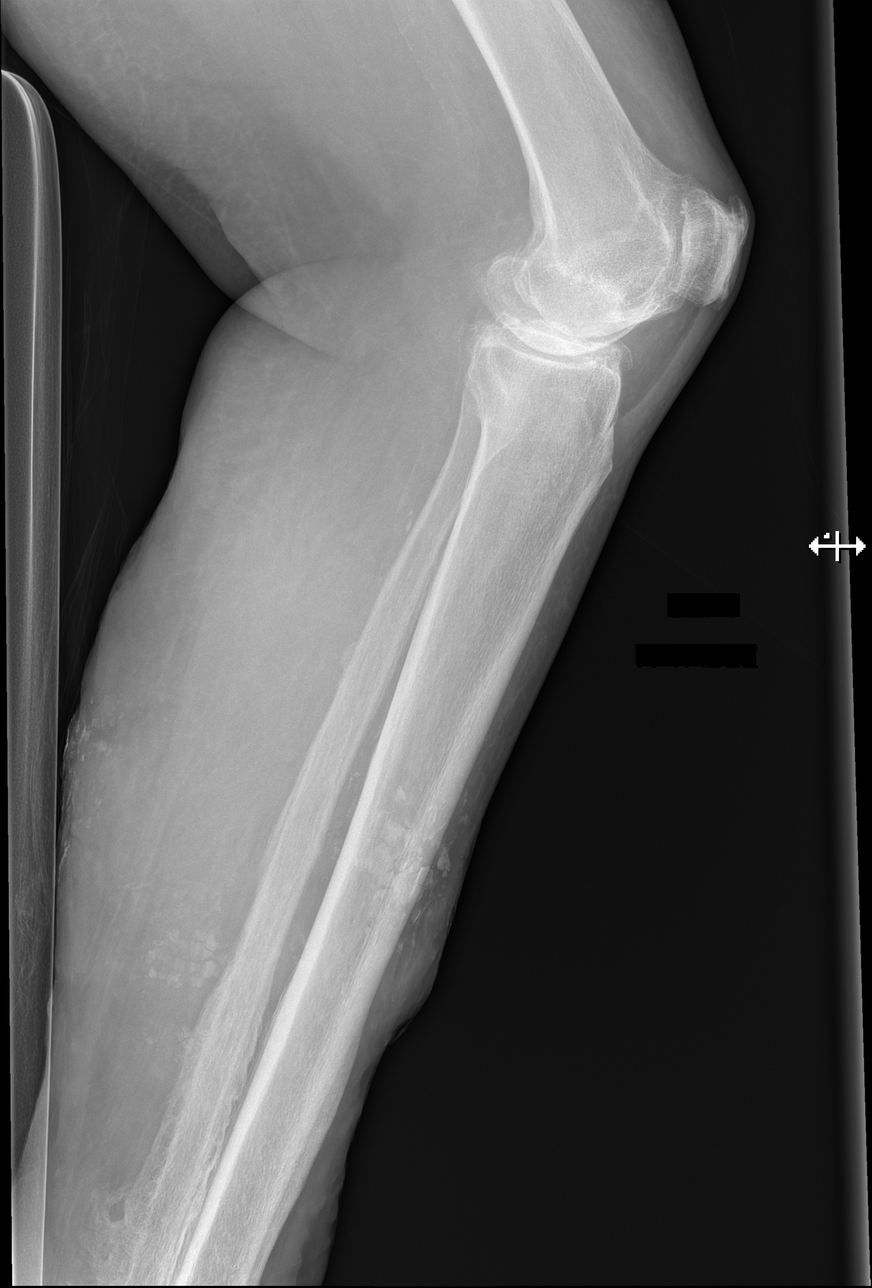

[x tib-fib lat left (2 of 2)]
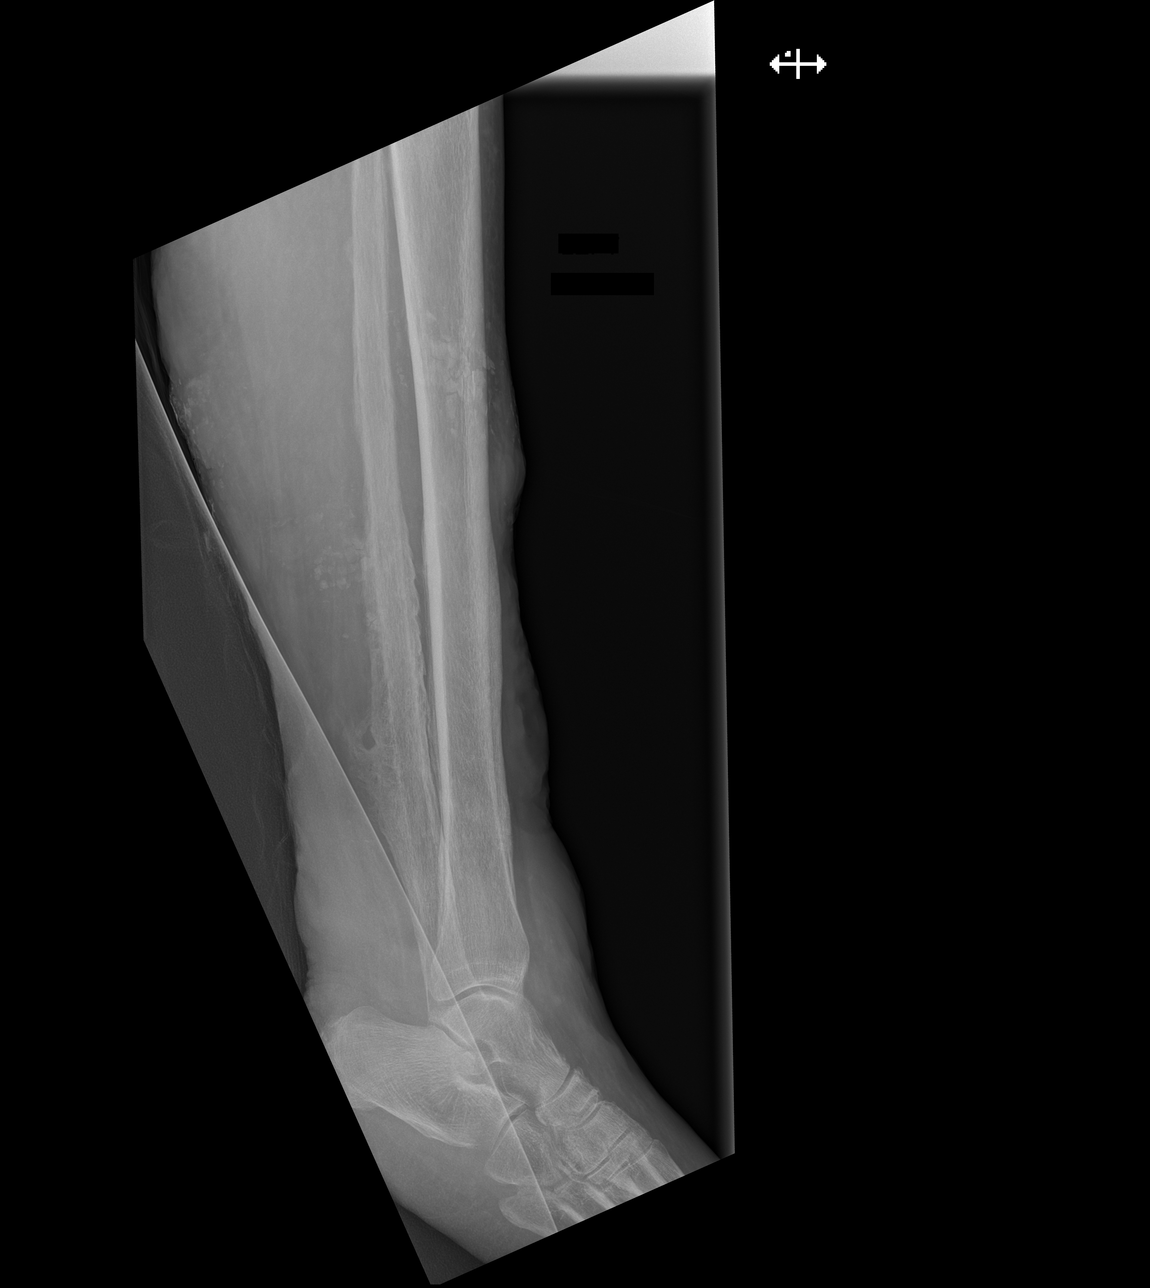

[4 of 4 positions shown; findings below may reference images not displayed]

FINDINGS: There is periosteal thickening in the distal diaphysis of the tibia
and fibula. These findings are similar to comparison exam. No
evidence of acute fracture. There is no subcutaneous gas. There are
a calcifications within the scan which are new.
IMPRESSION: 1. Periosteal thickening of the distal tibia and fibula. Cannot
exclude chronic osteomyelitis.
2. New skin calcifications.
3. No evidence of fracture.
